# Patient Record
Sex: Female | Born: 1995 | Race: Black or African American | Hispanic: No | Marital: Married | Smoking: Never smoker
Health system: Southern US, Community
[De-identification: ages and names within clinical notes are randomized; demographics above are authoritative.]

## PROBLEM LIST (undated history)

## (undated) ENCOUNTER — Inpatient Hospital Stay: Payer: Self-pay

## (undated) DIAGNOSIS — E119 Type 2 diabetes mellitus without complications: Secondary | ICD-10-CM

## (undated) DIAGNOSIS — D649 Anemia, unspecified: Secondary | ICD-10-CM

## (undated) HISTORY — PX: OTHER SURGICAL HISTORY: SHX169

## (undated) HISTORY — DX: Anemia, unspecified: D64.9

---
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
## 2019-07-13 ENCOUNTER — Other Ambulatory Visit: Payer: Self-pay

## 2019-07-13 DIAGNOSIS — Z20822 Contact with and (suspected) exposure to covid-19: Secondary | ICD-10-CM

## 2019-07-14 LAB — NOVEL CORONAVIRUS, NAA: SARS-CoV-2, NAA: NOT DETECTED

## 2019-08-06 ENCOUNTER — Other Ambulatory Visit: Payer: Self-pay

## 2019-08-06 DIAGNOSIS — Z20822 Contact with and (suspected) exposure to covid-19: Secondary | ICD-10-CM

## 2019-08-08 LAB — NOVEL CORONAVIRUS, NAA: SARS-CoV-2, NAA: NOT DETECTED

## 2019-11-28 ENCOUNTER — Ambulatory Visit: Payer: HRSA Program | Attending: Internal Medicine

## 2019-11-28 DIAGNOSIS — Z20828 Contact with and (suspected) exposure to other viral communicable diseases: Secondary | ICD-10-CM | POA: Insufficient documentation

## 2019-11-28 DIAGNOSIS — Z20822 Contact with and (suspected) exposure to covid-19: Secondary | ICD-10-CM

## 2019-11-29 LAB — NOVEL CORONAVIRUS, NAA: SARS-CoV-2, NAA: NOT DETECTED

## 2019-12-03 ENCOUNTER — Ambulatory Visit: Payer: HRSA Program | Attending: Internal Medicine

## 2019-12-03 DIAGNOSIS — Z20822 Contact with and (suspected) exposure to covid-19: Secondary | ICD-10-CM

## 2019-12-03 DIAGNOSIS — Z20828 Contact with and (suspected) exposure to other viral communicable diseases: Secondary | ICD-10-CM | POA: Insufficient documentation

## 2019-12-05 LAB — NOVEL CORONAVIRUS, NAA: SARS-CoV-2, NAA: NOT DETECTED

## 2019-12-24 ENCOUNTER — Other Ambulatory Visit: Payer: Self-pay

## 2019-12-25 ENCOUNTER — Ambulatory Visit: Payer: Managed Care, Other (non HMO) | Attending: Internal Medicine

## 2019-12-25 DIAGNOSIS — Z20822 Contact with and (suspected) exposure to covid-19: Secondary | ICD-10-CM | POA: Insufficient documentation

## 2019-12-27 LAB — NOVEL CORONAVIRUS, NAA: SARS-CoV-2, NAA: NOT DETECTED

## 2020-02-11 ENCOUNTER — Ambulatory Visit: Payer: Managed Care, Other (non HMO) | Attending: Internal Medicine

## 2020-02-14 ENCOUNTER — Ambulatory Visit: Payer: Managed Care, Other (non HMO) | Attending: Internal Medicine

## 2020-02-14 DIAGNOSIS — Z20822 Contact with and (suspected) exposure to covid-19: Secondary | ICD-10-CM

## 2020-02-15 LAB — NOVEL CORONAVIRUS, NAA: SARS-CoV-2, NAA: NOT DETECTED

## 2020-03-30 ENCOUNTER — Other Ambulatory Visit: Payer: Self-pay

## 2020-03-30 ENCOUNTER — Encounter: Payer: Self-pay | Admitting: Emergency Medicine

## 2020-03-30 ENCOUNTER — Observation Stay
Admission: EM | Admit: 2020-03-30 | Discharge: 2020-03-31 | Disposition: A | Discharge status: Home / Self Care | Payer: Self-pay | Attending: Internal Medicine | Admitting: Internal Medicine

## 2020-03-30 ENCOUNTER — Emergency Department: Payer: Self-pay

## 2020-03-30 DIAGNOSIS — O0383 Metabolic disorder following complete or unspecified spontaneous abortion: Secondary | ICD-10-CM | POA: Insufficient documentation

## 2020-03-30 DIAGNOSIS — N939 Abnormal uterine and vaginal bleeding, unspecified: Secondary | ICD-10-CM

## 2020-03-30 DIAGNOSIS — Z20822 Contact with and (suspected) exposure to covid-19: Secondary | ICD-10-CM | POA: Insufficient documentation

## 2020-03-30 DIAGNOSIS — O24111 Pre-existing diabetes mellitus, type 2, in pregnancy, first trimester: Secondary | ICD-10-CM | POA: Insufficient documentation

## 2020-03-30 DIAGNOSIS — E119 Type 2 diabetes mellitus without complications: Secondary | ICD-10-CM | POA: Insufficient documentation

## 2020-03-30 DIAGNOSIS — D649 Anemia, unspecified: Secondary | ICD-10-CM | POA: Diagnosis present

## 2020-03-30 DIAGNOSIS — E876 Hypokalemia: Secondary | ICD-10-CM | POA: Insufficient documentation

## 2020-03-30 DIAGNOSIS — O99011 Anemia complicating pregnancy, first trimester: Secondary | ICD-10-CM | POA: Insufficient documentation

## 2020-03-30 DIAGNOSIS — D62 Acute posthemorrhagic anemia: Secondary | ICD-10-CM | POA: Insufficient documentation

## 2020-03-30 DIAGNOSIS — Z3A00 Weeks of gestation of pregnancy not specified: Secondary | ICD-10-CM | POA: Insufficient documentation

## 2020-03-30 DIAGNOSIS — O039 Complete or unspecified spontaneous abortion without complication: Secondary | ICD-10-CM

## 2020-03-30 DIAGNOSIS — O036 Delayed or excessive hemorrhage following complete or unspecified spontaneous abortion: Principal | ICD-10-CM | POA: Insufficient documentation

## 2020-03-30 DIAGNOSIS — O99211 Obesity complicating pregnancy, first trimester: Secondary | ICD-10-CM | POA: Insufficient documentation

## 2020-03-30 HISTORY — DX: Type 2 diabetes mellitus without complications: E11.9

## 2020-03-30 LAB — COMPREHENSIVE METABOLIC PANEL
ALT: 13 U/L (ref 0–44)
AST: 14 U/L — ABNORMAL LOW (ref 15–41)
Albumin: 4 g/dL (ref 3.5–5.0)
Alkaline Phosphatase: 53 U/L (ref 38–126)
Anion gap: 7 (ref 5–15)
BUN: 14 mg/dL (ref 6–20)
CO2: 25 mmol/L (ref 22–32)
Calcium: 8.7 mg/dL — ABNORMAL LOW (ref 8.9–10.3)
Chloride: 105 mmol/L (ref 98–111)
Creatinine, Ser: 0.67 mg/dL (ref 0.44–1.00)
GFR calc Af Amer: >60 mL/min (ref >60)
GFR calc non Af Amer: >60 mL/min (ref >60)
Glucose, Bld: 99 mg/dL (ref 70–99)
Potassium: 3.4 mmol/L — ABNORMAL LOW (ref 3.5–5.1)
Sodium: 137 mmol/L (ref 135–145)
Total Bilirubin: 0.5 mg/dL (ref 0.3–1.2)
Total Protein: 7.8 g/dL (ref 6.5–8.1)

## 2020-03-30 LAB — URINALYSIS, COMPLETE (UACMP) WITH MICROSCOPIC
RBC / HPF: >50 RBC/hpf (ref 0–5)
Specific Gravity, Urine: 1.031 — ABNORMAL HIGH (ref 1.005–1.030)
Squamous Epithelial / HPF: NONE SEEN (ref 0–5)
WBC, UA: >50 WBC/hpf (ref 0–5)

## 2020-03-30 LAB — CBC
HCT: 30.6 % — ABNORMAL LOW (ref 36.0–46.0)
Hemoglobin: 8.8 g/dL — ABNORMAL LOW (ref 12.0–15.0)
MCH: 20.8 pg — ABNORMAL LOW (ref 26.0–34.0)
MCHC: 28.8 g/dL — ABNORMAL LOW (ref 30.0–36.0)
MCV: 72.2 fL — ABNORMAL LOW (ref 80.0–100.0)
Platelets: 504 10*3/uL — ABNORMAL HIGH (ref 150–400)
RBC: 4.24 MIL/uL (ref 3.87–5.11)
RDW: 18 % — ABNORMAL HIGH (ref 11.5–15.5)
WBC: 9.8 10*3/uL (ref 4.0–10.5)
nRBC: 0 % (ref 0.0–0.2)

## 2020-03-30 LAB — POCT PREGNANCY, URINE: Preg Test, Ur: POSITIVE — AB

## 2020-03-30 LAB — ABO/RH: ABO/RH(D): A POS

## 2020-03-30 NOTE — ED Notes (Signed)
Pt taken to ultrasound

## 2020-03-30 NOTE — ED Triage Notes (Signed)
Patient states that she took a home pregnancy test Thursday and it was positive. Patient states that she also started having light vaginal bleeding on Monday. Patient states that she started having heavier vaginal bleeding with clots today and some mild abdominal cramping.

## 2020-03-31 ENCOUNTER — Other Ambulatory Visit: Payer: Self-pay

## 2020-03-31 ENCOUNTER — Encounter: Payer: Self-pay | Admitting: Internal Medicine

## 2020-03-31 DIAGNOSIS — N939 Abnormal uterine and vaginal bleeding, unspecified: Secondary | ICD-10-CM

## 2020-03-31 DIAGNOSIS — D649 Anemia, unspecified: Secondary | ICD-10-CM

## 2020-03-31 DIAGNOSIS — D62 Acute posthemorrhagic anemia: Secondary | ICD-10-CM

## 2020-03-31 DIAGNOSIS — O039 Complete or unspecified spontaneous abortion without complication: Secondary | ICD-10-CM

## 2020-03-31 LAB — HCG, QUANTITATIVE, PREGNANCY
hCG, Beta Chain, Quant, S: 27 m[IU]/mL — ABNORMAL HIGH (ref <5)
hCG, Beta Chain, Quant, S: 19 m[IU]/mL — ABNORMAL HIGH (ref <5)

## 2020-03-31 LAB — CBC
HCT: 28.8 % — ABNORMAL LOW (ref 36.0–46.0)
Hemoglobin: 8.3 g/dL — ABNORMAL LOW (ref 12.0–15.0)
MCH: 20.6 pg — ABNORMAL LOW (ref 26.0–34.0)
MCHC: 28.8 g/dL — ABNORMAL LOW (ref 30.0–36.0)
MCV: 71.6 fL — ABNORMAL LOW (ref 80.0–100.0)
Platelets: 497 10*3/uL — ABNORMAL HIGH (ref 150–400)
RBC: 4.02 MIL/uL (ref 3.87–5.11)
RDW: 17.9 % — ABNORMAL HIGH (ref 11.5–15.5)
WBC: 11.4 10*3/uL — ABNORMAL HIGH (ref 4.0–10.5)
nRBC: 0 % (ref 0.0–0.2)

## 2020-03-31 LAB — COMPREHENSIVE METABOLIC PANEL
ALT: 12 U/L (ref 0–44)
AST: 15 U/L (ref 15–41)
Albumin: 3.5 g/dL (ref 3.5–5.0)
Alkaline Phosphatase: 46 U/L (ref 38–126)
Anion gap: 6 (ref 5–15)
BUN: 13 mg/dL (ref 6–20)
CO2: 27 mmol/L (ref 22–32)
Calcium: 8.6 mg/dL — ABNORMAL LOW (ref 8.9–10.3)
Chloride: 105 mmol/L (ref 98–111)
Creatinine, Ser: 0.72 mg/dL (ref 0.44–1.00)
GFR calc Af Amer: >60 mL/min (ref >60)
GFR calc non Af Amer: >60 mL/min (ref >60)
Glucose, Bld: 108 mg/dL — ABNORMAL HIGH (ref 70–99)
Potassium: 3.1 mmol/L — ABNORMAL LOW (ref 3.5–5.1)
Sodium: 138 mmol/L (ref 135–145)
Total Bilirubin: 0.6 mg/dL (ref 0.3–1.2)
Total Protein: 7.2 g/dL (ref 6.5–8.1)

## 2020-03-31 LAB — TYPE AND SCREEN
ABO/RH(D): A POS
Antibody Screen: NEGATIVE

## 2020-03-31 LAB — HEMOGLOBIN AND HEMATOCRIT, BLOOD
HCT: 28.6 % — ABNORMAL LOW (ref 36.0–46.0)
HCT: 30 % — ABNORMAL LOW (ref 36.0–46.0)
Hemoglobin: 8.2 g/dL — ABNORMAL LOW (ref 12.0–15.0)
Hemoglobin: 8.7 g/dL — ABNORMAL LOW (ref 12.0–15.0)

## 2020-03-31 LAB — RETICULOCYTES
Immature Retic Fract: 40.4 % — ABNORMAL HIGH (ref 2.3–15.9)
RBC.: 4.08 MIL/uL (ref 3.87–5.11)
Retic Count, Absolute: 37.9 10*3/uL (ref 19.0–186.0)
Retic Ct Pct: 0.9 % (ref 0.4–3.1)

## 2020-03-31 LAB — VITAMIN B12: Vitamin B-12: 460 pg/mL (ref 180–914)

## 2020-03-31 LAB — IRON AND TIBC
Iron: 19 ug/dL — ABNORMAL LOW (ref 28–170)
Saturation Ratios: 5 % — ABNORMAL LOW (ref 10.4–31.8)
TIBC: 368 ug/dL (ref 250–450)
UIBC: 349 ug/dL

## 2020-03-31 LAB — FOLATE: Folate: 14.3 ng/mL (ref >5.9)

## 2020-03-31 LAB — FERRITIN: Ferritin: 6 ng/mL — ABNORMAL LOW (ref 11–307)

## 2020-03-31 LAB — HIV ANTIBODY (ROUTINE TESTING W REFLEX): HIV Screen 4th Generation wRfx: NONREACTIVE

## 2020-03-31 LAB — GLUCOSE, CAPILLARY
Glucose-Capillary: 82 mg/dL (ref 70–99)
Glucose-Capillary: 110 mg/dL — ABNORMAL HIGH (ref 70–99)

## 2020-03-31 LAB — HEMOGLOBIN A1C
Hgb A1c MFr Bld: 6.6 % — ABNORMAL HIGH (ref 4.8–5.6)
Mean Plasma Glucose: 142.72 mg/dL

## 2020-03-31 LAB — RESPIRATORY PANEL BY RT PCR (FLU A&B, COVID)
Influenza A by PCR: NEGATIVE
Influenza B by PCR: NEGATIVE
SARS Coronavirus 2 by RT PCR: NEGATIVE

## 2020-03-31 MED ORDER — SODIUM CHLORIDE 0.9 % IV SOLN
INTRAVENOUS | Status: DC | PRN
  Administered 2020-03-31: 250 mL via INTRAVENOUS

## 2020-03-31 MED ORDER — POTASSIUM CHLORIDE CRYS ER 20 MEQ PO TBCR
40.0000 meq | EXTENDED_RELEASE_TABLET | Freq: Once | ORAL | Status: AC
  Administered 2020-03-31: 40 meq via ORAL
  Filled 2020-03-31: qty 2

## 2020-03-31 MED ORDER — POTASSIUM CHLORIDE 20 MEQ/15ML (10%) PO SOLN
40.0000 meq | Freq: Once | ORAL | Status: DC
  Filled 2020-03-31: qty 30

## 2020-03-31 MED ORDER — SODIUM CHLORIDE 0.9 % IV SOLN
200.0000 mg | Freq: Once | INTRAVENOUS | Status: AC
  Administered 2020-03-31: 200 mg via INTRAVENOUS
  Filled 2020-03-31: qty 10

## 2020-03-31 MED ORDER — POTASSIUM CHLORIDE 20 MEQ/15ML (10%) PO SOLN
40.0000 meq | Freq: Once | ORAL | Status: AC
  Administered 2020-03-31: 40 meq via ORAL
  Filled 2020-03-31: qty 30

## 2020-03-31 MED ORDER — INSULIN ASPART 100 UNIT/ML ~~LOC~~ SOLN: 0.0000 [IU] | Freq: Three times a day (TID) | SUBCUTANEOUS | Status: DC

## 2020-03-31 NOTE — ED Notes (Signed)
Pt back from ultrasound.

## 2020-03-31 NOTE — Discharge Summary (Addendum)
Physician Discharge Summary  Robin Houston HYQ:[id number] DOB: [birth year] DOA: 03/30/2020  PCP: Patient, No Pcp Per  Admit date: 03/30/2020 Discharge date: 03/31/2020  Admitted From: home Disposition:  home  Recommendations for Outpatient Follow-up:  Follow up with PCP in 1-2 weeks Please obtain BMP/CBC in one week   Home Health: no  Equipment/Devices: none   Discharge Condition: stable  CODE STATUS: full  Diet recommendation: Heart Healthy / Carb Modified    Discharge Diagnoses: Active Problems:   Symptomatic anemia Miscarriage with blood loss anemia  Summary of HPI and Hospital Course:   Robin Houston is a 24 y.o. female with medical history significant of obesity, diet-controlled diabetes mellitus, who comes to the hospital with chief complaint of vaginal bleeding.  Her LMP was about 5 weeks ago, and because it was delayed she checked a pregnancy test over a week ago when there was positive.  Last week she has some vaginal spotting, however over the last couple of days she has noticed progressive vaginal bleeding, more so today and decided to come to the hospital and also has been passing some large clots.  She denies any nausea or vomiting, no fever or chills.  No abdominal pain.  She denies any chest pain or shortness of breath.  No lightheadedness or dizziness.  She never had a pregnancy before.  Currently does not have an OB/GYN doctor.   ED Course: She is afebrile 98.2, heart rate 100, blood pressure normal and 79 9% on room air.  Her blood work shows anemia with hemoglobin 8.8, most recent hemoglobin as an outpatient is in January 2021 which was 13.  She underwent an ultrasound in the ED which did not show any intrauterine pregnancy, suspicious for failed pregnancy or early gestation.  EDP discussed with OB/GYN on-call, recommended admission for monitoring H&H, repeat bHCG.   Bleeding decreased significantly overnight and the following day.  Hemoglobin stabilized and  improved.  Patient was given an iron infusion.  Repeat HCG 19, down from 27.  Patient stable for discharge home with PCP follow up, and was provided information for establishing with a PCP by social worker.  Advised she have repeat labs in 1 week or sooner if any increased bleeding, patient and husband expressed agreement and understanding.   Discharge Instructions   Discharge Instructions     Call MD for:   Complete by: As directed    Recurrence of heavy bleeding, especially if also having fatigue, dizziness or lightheadedness.   Call MD for:  extreme fatigue   Complete by: As directed    Call MD for:  persistant dizziness or light-headedness   Complete by: As directed    Call MD for:  severe uncontrolled pain   Complete by: As directed    Call MD for:  temperature >100.4   Complete by: As directed    Increase activity slowly   Complete by: As directed       Allergies as of 03/31/2020   No Known Allergies      Medication List     TAKE these medications    metFORMIN 1000 MG tablet Commonly known as: GLUCOPHAGE Take 1,000 mg by mouth 2 (two) times daily.         No Known Allergies  Consultations: None    Procedures/Studies: US OB LESS THAN 14 WEEKS WITH OB TRANSVAGINAL  Result Date: 03/31/2020 CLINICAL DATA:  Vaginal bleeding EXAM: OBSTETRIC <14 WK [place] AND TRANSVAGINAL OB US TECHNIQUE: Both transabdominal and transvaginal ultrasound examinations were  performed for complete evaluation of the gestation as well as the maternal uterus, adnexal regions, and pelvic cul-de-sac. Transvaginal technique was performed to assess early pregnancy. COMPARISON:  None. FINDINGS: Intrauterine gestational sac: None Yolk sac:  Not Visualized. Embryo:  Not Visualized. Subchorionic hemorrhage:  None visualized. Maternal uterus/adnexae: The right ovary was not visualized. The left ovary is normal in appearance measuring 2.0 x 2.7 x 2.4 cm. No free fluid is noted. IMPRESSION: No intrauterine  pregnancy visualized. This may be suspicious for failed pregnancy or early gestation. Would recommend follow-up quantitative B-HCG levels and follow-up US in 14 days to assess viability. This recommendation follows SRU consensus guidelines: Diagnostic Criteria for Nonviable Pregnancy Early in the First Trimester. Malva Limes Med 2013; 937:1696-78. Electronically Signed   By: Jonna Clark M.D.   On: 03/31/2020 00:00      Subjective: Patient reports feeling well, bleeding slowed and now minimal.  No abdominal pain or cramping, fever or chills or other complaints.   Discharge Exam: Vitals:   03/31/20 0739 03/31/20 1101  BP: (!) 109/52 99/66  Pulse: 75 90  Resp: 18 20  Temp: 97.6 F (36.4 C) 98.1 F (36.7 C)  SpO2: 100%    Vitals:   03/31/20 0220 03/31/20 0239 03/31/20 0739 03/31/20 1101  BP: 129/69 122/63 (!) 109/52 99/66  Pulse: 85 (!) 102 75 90  Resp: 18 20 18 20   Temp:  98.4 F (36.9 C) 97.6 F (36.4 C) 98.1 F (36.7 C)  TempSrc:  Oral Oral Oral  SpO2: 100% 99% 100%   Weight:      Height:        General: Pt is alert, awake, not in acute distress Cardiovascular: RRR, S1/S2 +, no rubs, no gallops Respiratory: CTA bilaterally, no wheezing, no rhonchi Abdominal: Soft, NT, ND, bowel sounds + Extremities: no edema, no cyanosis    The results of significant diagnostics from this hospitalization (including imaging, microbiology, ancillary and laboratory) are listed below for reference.     Microbiology: Recent Results (from the past 240 hour(s))  Respiratory Panel by RT PCR (Flu A&B, Covid) - Nasopharyngeal Swab     Status: None   Collection Time: 03/31/20 12:48 AM   Specimen: Nasopharyngeal Swab  Result Value Ref Range Status   SARS Coronavirus 2 by RT PCR NEGATIVE NEGATIVE Final    Comment: (NOTE) SARS-CoV-2 target nucleic acids are NOT DETECTED. The SARS-CoV-2 RNA is generally detectable in upper respiratoy specimens during the acute phase of infection. The  lowest concentration of SARS-CoV-2 viral copies this assay can detect is 131 copies/mL. A negative result does not preclude SARS-Cov-2 infection and should not be used as the sole basis for treatment or other patient management decisions. A negative result may occur with  improper specimen collection/handling, submission of specimen other than nasopharyngeal swab, presence of viral mutation(s) within the areas targeted by this assay, and inadequate number of viral copies (<131 copies/mL). A negative result must be combined with clinical observations, patient history, and epidemiological information. The expected result is Negative. Fact Sheet for Patients:  04/02/20 Fact Sheet for Healthcare Providers:  https://www.moore.com/ This test is not yet ap proved or cleared by the https://www.young.biz/ FDA and  has been authorized for detection and/or diagnosis of SARS-CoV-2 by FDA under an Emergency Use Authorization (EUA). This EUA will remain  in effect (meaning this test can be used) for the duration of the COVID-19 declaration under Section 564(b)(1) of the Act, 21 U.S.C. section 360bbb-3(b)(1), unless the authorization  is terminated or revoked sooner.    Influenza A by PCR NEGATIVE NEGATIVE Final   Influenza B by PCR NEGATIVE NEGATIVE Final    Comment: (NOTE) The Xpert Xpress SARS-CoV-2/FLU/RSV assay is intended as an aid in  the diagnosis of influenza from Nasopharyngeal swab specimens and  should not be used as a sole basis for treatment. Nasal washings and  aspirates are unacceptable for Xpert Xpress SARS-CoV-2/FLU/RSV  testing. Fact Sheet for Patients: PinkCheek.be Fact Sheet for Healthcare Providers: GravelBags.it This test is not yet approved or cleared by the [place] FDA and  has been authorized for detection and/or diagnosis of SARS-CoV-2 by  FDA under an Emergency  Use Authorization (EUA). This EUA will remain  in effect (meaning this test can be used) for the duration of the  Covid-19 declaration under Section 564(b)(1) of the Act, 21  U.S.C. section 360bbb-3(b)(1), unless the authorization is  terminated or revoked. Performed at [facility], [address] [place], [place] [address]      Labs: BNP (last 3 results) No results for input(s): BNP in the last 8760 hours. Basic Metabolic Panel: Recent Labs  Lab 03/30/20 2159 03/31/20 0329  NA 137 138  K 3.4* 3.1*  CL 105 105  CO2 25 27  GLUCOSE 99 108*  BUN 14 13  CREATININE 0.67 0.72  CALCIUM 8.7* 8.6*   Liver Function Tests: Recent Labs  Lab 03/30/20 2159 03/31/20 0329  AST 14* 15  ALT 13 12  ALKPHOS 53 46  BILITOT 0.5 0.6  PROT 7.8 7.2  ALBUMIN 4.0 3.5   No results for input(s): LIPASE, AMYLASE in the last 168 hours. No results for input(s): AMMONIA in the last 168 hours. CBC: Recent Labs  Lab 03/30/20 2159 03/31/20 0329 03/31/20 1248  WBC 9.8 11.4*  --   HGB 8.8* 8.2*  8.3* 8.7*  HCT 30.6* 28.6*  28.8* 30.0*  MCV 72.2* 71.6*  --   PLT 504* 497*  --    Cardiac Enzymes: No results for input(s): CKTOTAL, CKMB, CKMBINDEX, TROPONINI in the last 168 hours. BNP: Invalid input(s): POCBNP CBG: Recent Labs  Lab 03/31/20 0828 03/31/20 1231  GLUCAP 82 110*   D-Dimer No results for input(s): DDIMER in the last 72 hours. Hgb A1c Recent Labs    03/31/20 0329  HGBA1C 6.6*   Lipid Profile No results for input(s): CHOL, HDL, LDLCALC, TRIG, CHOLHDL, LDLDIRECT in the last 72 hours. Thyroid function studies No results for input(s): TSH, T4TOTAL, T3FREE, THYROIDAB in the last 72 hours.  Invalid input(s): FREET3 Anemia work up Recent Labs    03/31/20 0329  VITAMINB12 460  FOLATE 14.3  FERRITIN 6*  TIBC 368  IRON 19*  RETICCTPCT 0.9   Urinalysis    Component Value Date/Time   COLORURINE RED (A) 03/30/2020 2159   APPEARANCEUR CLOUDY (A)  03/30/2020 2159   LABSPEC 1.031 (H) 03/30/2020 2159   PHURINE  03/30/2020 2159    TEST NOT REPORTED DUE TO COLOR INTERFERENCE OF URINE PIGMENT   GLUCOSEU (A) 03/30/2020 2159    TEST NOT REPORTED DUE TO COLOR INTERFERENCE OF URINE PIGMENT   HGBUR (A) 03/30/2020 2159    TEST NOT REPORTED DUE TO COLOR INTERFERENCE OF URINE PIGMENT   BILIRUBINUR (A) 03/30/2020 2159    TEST NOT REPORTED DUE TO COLOR INTERFERENCE OF URINE PIGMENT   KETONESUR (A) 03/30/2020 2159    TEST NOT REPORTED DUE TO COLOR INTERFERENCE OF URINE PIGMENT   PROTEINUR (A) 03/30/2020 2159  TEST NOT REPORTED DUE TO COLOR INTERFERENCE OF URINE PIGMENT   NITRITE (A) 03/30/2020 2159    TEST NOT REPORTED DUE TO COLOR INTERFERENCE OF URINE PIGMENT   LEUKOCYTESUR (A) 03/30/2020 2159    TEST NOT REPORTED DUE TO COLOR INTERFERENCE OF URINE PIGMENT   Sepsis Labs Invalid input(s): PROCALCITONIN,  WBC,  LACTICIDVEN Microbiology Recent Results (from the past 240 hour(s))  Respiratory Panel by RT PCR (Flu A&B, Covid) - Nasopharyngeal Swab     Status: None   Collection Time: 03/31/20 12:48 AM   Specimen: Nasopharyngeal Swab  Result Value Ref Range Status   SARS Coronavirus 2 by RT PCR NEGATIVE NEGATIVE Final    Comment: (NOTE) SARS-CoV-2 target nucleic acids are NOT DETECTED. The SARS-CoV-2 RNA is generally detectable in upper respiratoy specimens during the acute phase of infection. The lowest concentration of SARS-CoV-2 viral copies this assay can detect is 131 copies/mL. A negative result does not preclude SARS-Cov-2 infection and should not be used as the sole basis for treatment or other patient management decisions. A negative result may occur with  improper specimen collection/handling, submission of specimen other than nasopharyngeal swab, presence of viral mutation(s) within the areas targeted by this assay, and inadequate number of viral copies (<131 copies/mL). A negative result must be combined with  clinical observations, patient history, and epidemiological information. The expected result is Negative. Fact Sheet for Patients:  https://www.moore.com/ Fact Sheet for Healthcare Providers:  https://www.young.biz/ This test is not yet ap proved or cleared by the [place] FDA and  has been authorized for detection and/or diagnosis of SARS-CoV-2 by FDA under an Emergency Use Authorization (EUA). This EUA will remain  in effect (meaning this test can be used) for the duration of the COVID-19 declaration under Section 564(b)(1) of the Act, 21 U.S.C. section 360bbb-3(b)(1), unless the authorization is terminated or revoked sooner.    Influenza A by PCR NEGATIVE NEGATIVE Final   Influenza B by PCR NEGATIVE NEGATIVE Final    Comment: (NOTE) The Xpert Xpress SARS-CoV-2/FLU/RSV assay is intended as an aid in  the diagnosis of influenza from Nasopharyngeal swab specimens and  should not be used as a sole basis for treatment. Nasal washings and  aspirates are unacceptable for Xpert Xpress SARS-CoV-2/FLU/RSV  testing. Fact Sheet for Patients: https://www.moore.com/ Fact Sheet for Healthcare Providers: https://www.young.biz/ This test is not yet approved or cleared by the [place] FDA and  has been authorized for detection and/or diagnosis of SARS-CoV-2 by  FDA under an Emergency Use Authorization (EUA). This EUA will remain  in effect (meaning this test can be used) for the duration of the  Covid-19 declaration under Section 564(b)(1) of the Act, 21  U.S.C. section 360bbb-3(b)(1), unless the authorization is  terminated or revoked. Performed at [facility], [address] [place], [place] [address]      Time coordinating discharge: Over 30 minutes  SIGNED:   Pennie Banter, DO [facility] 03/31/2020, 1:59 PM   If 7PM-7AM, please contact night-coverage [email or web address]

## 2020-03-31 NOTE — ED Provider Notes (Signed)
[facility] Emergency Department Provider Note  ____________________________________________  Time seen: Approximately 12:55 AM  I have reviewed the triage vital signs and the nursing notes.   HISTORY  Chief Complaint Vaginal Bleeding   HPI Robin Houston is a 24 y.o. female G1, P0 at [redacted] weeks gestational age with a history of diabetes who presents for evaluation of vaginal bleeding.  Patient reports taking 4 home pregnancy test a week ago which were all positive.  LMP was 5 weeks ago.  Last week she started with some mild vaginal spotting and then today she started having vaginal bleeding.  She reports going through 3 pads and passing some large clots.  She denies any history of anemia, bleeding disorder, she is not on any blood thinners, denies any dizziness, syncope, chest pain or shortness of breath.  She has been having some intermittent mild lower abdominal cramping.   Past Medical History:  Diagnosis Date  . Diabetes mellitus without complication (HCC)    type 2    Allergies Patient has no known allergies.  FH Sister - anemia  Social History Social History   Tobacco Use  . Smoking status: Never Smoker  . Smokeless tobacco: Never Used  Substance Use Topics  . Alcohol use: Never  . Drug use: Never    Review of Systems  Constitutional: Negative for fever. Eyes: Negative for visual changes. ENT: Negative for sore throat. Neck: No neck pain  Cardiovascular: Negative for chest pain. Respiratory: Negative for shortness of breath. Gastrointestinal: Negative for abdominal pain, vomiting or diarrhea. Genitourinary: Negative for dysuria. + Vaginal bleed Musculoskeletal: Negative for back pain. Skin: Negative for rash. Neurological: Negative for headaches, weakness or numbness. Psych: No SI or HI  ____________________________________________   PHYSICAL EXAM:  VITAL SIGNS: ED Triage Vitals [03/30/20 2155]  Enc Vitals Group     BP  134/75     Pulse Rate 100     Resp 18     Temp 98.2 F (36.8 C)     Temp Source Oral     SpO2 99 %     Weight 283 lb (128.4 kg)     Height 5\' 5"  (1.651 m)     Head Circumference      Peak Flow      Pain Score 2     Pain Loc      Pain Edu?      Excl. in GC?     Constitutional: Alert and oriented. Well appearing and in no apparent distress. HEENT:      Head: Normocephalic and atraumatic.         Eyes: Conjunctivae are normal. Sclera is non-icteric.       Mouth/Throat: Mucous membranes are moist.       Neck: Supple with no signs of meningismus. Cardiovascular: Regular rate and rhythm. No murmurs, gallops, or rubs.  Respiratory: Normal respiratory effort. Lungs are clear to auscultation bilaterally.  Gastrointestinal: Soft, non tender, and non distended with positive bowel sounds. No rebound or guarding. Musculoskeletal:  No edema, cyanosis, or erythema of extremities. Neurologic: Normal speech and language. Face is symmetric. Moving all extremities. No gross focal neurologic deficits are appreciated. Skin: Skin is warm, dry and intact. No rash noted. Psychiatric: Mood and affect are normal. Speech and behavior are normal.  ____________________________________________   LABS (all labs ordered are listed, but only abnormal results are displayed)  Labs Reviewed  CBC - Abnormal; Notable for the following components:      Result  Value   Hemoglobin 8.8 (*)    HCT 30.6 (*)    MCV 72.2 (*)    MCH 20.8 (*)    MCHC 28.8 (*)    RDW 18.0 (*)    Platelets 504 (*)    All other components within normal limits  COMPREHENSIVE METABOLIC PANEL - Abnormal; Notable for the following components:   Potassium 3.4 (*)    Calcium 8.7 (*)    AST 14 (*)    All other components within normal limits  URINALYSIS, COMPLETE (UACMP) WITH MICROSCOPIC - Abnormal; Notable for the following components:   Color, Urine RED (*)    APPearance CLOUDY (*)    Specific Gravity, Urine 1.031 (*)    Glucose, UA    (*)    Value: TEST NOT REPORTED DUE TO COLOR INTERFERENCE OF URINE PIGMENT   Hgb urine dipstick   (*)    Value: TEST NOT REPORTED DUE TO COLOR INTERFERENCE OF URINE PIGMENT   Bilirubin Urine   (*)    Value: TEST NOT REPORTED DUE TO COLOR INTERFERENCE OF URINE PIGMENT   Ketones, ur   (*)    Value: TEST NOT REPORTED DUE TO COLOR INTERFERENCE OF URINE PIGMENT   Protein, ur   (*)    Value: TEST NOT REPORTED DUE TO COLOR INTERFERENCE OF URINE PIGMENT   Nitrite   (*)    Value: TEST NOT REPORTED DUE TO COLOR INTERFERENCE OF URINE PIGMENT   Leukocytes,Ua   (*)    Value: TEST NOT REPORTED DUE TO COLOR INTERFERENCE OF URINE PIGMENT   Bacteria, UA RARE (*)    All other components within normal limits  HCG, QUANTITATIVE, PREGNANCY - Abnormal; Notable for the following components:   hCG, Beta Chain, Quant, S 27 (*)    All other components within normal limits  POCT PREGNANCY, URINE - Abnormal; Notable for the following components:   Preg Test, Ur POSITIVE (*)    All other components within normal limits  RESPIRATORY PANEL BY RT PCR (FLU A&B, COVID)  HEMOGLOBIN AND HEMATOCRIT, BLOOD  POC URINE PREG, ED  ABO/RH  TYPE AND SCREEN   ____________________________________________  EKG  none  ____________________________________________  RADIOLOGY  I have personally reviewed the images performed during this visit and I agree with the Radiologist's read.   Interpretation by Radiologist:  US OB LESS THAN 14 WEEKS WITH OB TRANSVAGINAL  Result Date: 03/31/2020 CLINICAL DATA:  Vaginal bleeding EXAM: OBSTETRIC <14 WK [place] AND TRANSVAGINAL OB US TECHNIQUE: Both transabdominal and transvaginal ultrasound examinations were performed for complete evaluation of the gestation as well as the maternal uterus, adnexal regions, and pelvic cul-de-sac. Transvaginal technique was performed to assess early pregnancy. COMPARISON:  None. FINDINGS: Intrauterine gestational sac: None Yolk sac:  Not Visualized. Embryo:   Not Visualized. Subchorionic hemorrhage:  None visualized. Maternal uterus/adnexae: The right ovary was not visualized. The left ovary is normal in appearance measuring 2.0 x 2.7 x 2.4 cm. No free fluid is noted. IMPRESSION: No intrauterine pregnancy visualized. This may be suspicious for failed pregnancy or early gestation. Would recommend follow-up quantitative B-HCG levels and follow-up US in 14 days to assess viability. This recommendation follows SRU consensus guidelines: Diagnostic Criteria for Nonviable Pregnancy Early in the First Trimester. Alta Corning Med 2013; 322:0254-27. Electronically Signed   By: Prudencio Pair M.D.   On: 03/31/2020 00:00     ____________________________________________   PROCEDURES  Procedure(s) performed: None Procedures Critical Care performed:  None ____________________________________________   INITIAL IMPRESSION /  ASSESSMENT AND PLAN / ED COURSE  24 y.o. female G1, P0 at [redacted] weeks gestational age with a history of diabetes who presents for evaluation of vaginal bleeding.  Patient is hemodynamically stable, abdomen is soft with no tenderness.  Ultrasound shows no evidence of pregnancy.  Blood work showing hemoglobin of 8.8 patient had lab work done back in January 2021 with a hemoglobin of 13.  She denies any prior history of anemia.  Also has thrombocytosis with platelets of 504.  MCV of 72.2.  These lab results are most likely due to a failed pregnancy and current miscarriage.  Type and cross is active.  Discussed with hospitalist for admission for monitoring vaginal bleeding and need for blood transfusion.  Consult OB/GYN, Genia Del CNM on call who agrees with plan. Patient will also need repeat hCG to ensure this is a miscarriage and not an early pregnancy which has not been visualized at this time.  Old medical records have been reviewed.  History gathered from patient and her husband who is at bedside.       _____________________________________________ Please note:  Patient was evaluated in Emergency Department today for the symptoms described in the history of present illness. Patient was evaluated in the context of the global COVID-19 pandemic, which necessitated consideration that the patient might be at risk for infection with the SARS-CoV-2 virus that causes COVID-19. Institutional protocols and algorithms that pertain to the evaluation of patients at risk for COVID-19 are in a state of rapid change based on information released by regulatory bodies including the CDC and federal and state organizations. These policies and algorithms were followed during the patient's care in the ED.  Some ED evaluations and interventions may be delayed as a result of limited staffing during the pandemic.   [place] Controlled Substance Database was reviewed by me. ____________________________________________   FINAL CLINICAL IMPRESSION(S) / ED DIAGNOSES   Final diagnoses:  Vaginal bleeding  Acute blood loss anemia  Miscarriage      NEW MEDICATIONS STARTED DURING THIS VISIT:  ED Discharge Orders    None       Note:  This document was prepared using Dragon voice recognition software and may include unintentional dictation errors.    Don Perking, [place], MD 03/31/20 0100

## 2020-03-31 NOTE — Progress Notes (Signed)
CSW was consulted due to patient needing a PCP. CSW met with patient at bedside. Patient is uninsured. CSW provided [employer] Booklet as well as [facility] and [facility] application. Patient was agreeable to CSW sending patient's information to [facility]. CSW informed [facility] Representative [place] of referral.  Patient denied additional needs. CSW encouraged patient to reach out if any needs arise.  Oleh Genin, [place]

## 2020-03-31 NOTE — Progress Notes (Addendum)
Robin Houston was seen and treated at [facility] on 03/30/2020-03/31/2020. Vito Berger was her support person.    Treatment Team:  Attending Provider: Pennie Banter, DO

## 2020-03-31 NOTE — Discharge Instructions (Signed)
Abnormal Uterine Bleeding °Abnormal uterine bleeding means bleeding more than usual from your uterus. It can include: °· Bleeding between periods. °· Bleeding after sex. °· Bleeding that is heavier than normal. °· Periods that last longer than usual. °· Bleeding after you have stopped having your period (menopause). °There are many problems that may cause this. You should see a doctor for any kind of bleeding that is not normal. Treatment depends on the cause of the bleeding. °Follow these instructions at home: °· Watch your condition for any changes. °· Do not use tampons, douche, or have sex, if your doctor tells you not to. °· Change your pads often. °· Get regular well-woman exams. Make sure they include a pelvic exam and cervical cancer screening. °· Keep all follow-up visits as told by your doctor. This is important. °Contact a doctor if: °· The bleeding lasts more than one week. °· You feel dizzy at times. °· You feel like you are going to throw up (nauseous). °· You throw up. °Get help right away if: °· You pass out. °· You have to change pads every hour. °· You have belly (abdominal) pain. °· You have a fever. °· You get sweaty. °· You get weak. °· You passing large blood clots from your vagina. °Summary °· Abnormal uterine bleeding means bleeding more than usual from your uterus. °· There are many problems that may cause this. You should see a doctor for any kind of bleeding that is not normal. °· Treatment depends on the cause of the bleeding. °This information is not intended to replace advice given to you by your health care provider. Make sure you discuss any questions you have with your health care provider. °Document Revised: 11/16/2016 Document Reviewed: 11/16/2016 °Elsevier Patient Education © 2020 Elsevier Inc. ° °

## 2020-03-31 NOTE — ED Notes (Signed)
Provider at bedside

## 2020-03-31 NOTE — H&P (Addendum)
History and Physical    Robin Houston SHN:[id number] DOB: 15-Nov-1996 DOA: 03/30/2020  I have briefly reviewed the patient's prior medical records in [facility]  PCP: Patient, No Pcp Per  Patient coming from: home  Chief Complaint: vaginal bleeding   HPI: Robin Houston is a 24 y.o. female with medical history significant of obesity, diet-controlled diabetes mellitus, who comes to the hospital with chief complaint of vaginal bleeding.  Her LMP was about 5 weeks ago, and because it was delayed she checked a pregnancy test over a week ago when there was positive.  Last week she has some vaginal spotting, however over the last couple of days she has noticed progressive vaginal bleeding, more so today and decided to come to the hospital and also has been passing some large clots.  She denies any nausea or vomiting, no fever or chills.  No abdominal pain.  She denies any chest pain or shortness of breath.  No lightheadedness or dizziness.  She never had a pregnancy before.  Currently does not have an OB/GYN doctor.  ED Course: She is afebrile 98.2, heart rate 100, blood pressure normal and 79 9% on room air.  Her blood work shows anemia with hemoglobin 8.8, most recent hemoglobin as an outpatient is in January 2021 which was 13.  She underwent an ultrasound in the ED which did not show any intrauterine pregnancy, suspicious for failed pregnancy or early gestation.  EDP discussed with OB/GYN on-call, recommended admission for monitoring H&H, repeat  Review of Systems: All systems reviewed, and apart from HPI, all negative  Past Medical History:  Diagnosis Date  . Diabetes mellitus without complication (HCC)    type 2    History reviewed. No pertinent surgical history.   reports that she has never smoked. She has never used smokeless tobacco. She reports that she does not drink alcohol or use drugs.  No Known Allergies  Family history reviewed and not pertinent  Prior to  Admission medications   Not on File    Physical Exam: Vitals:   03/30/20 2155  BP: 134/75  Pulse: 100  Resp: 18  Temp: 98.2 F (36.8 C)  TempSrc: Oral  SpO2: 99%  Weight: 128.4 kg  Height: 5\' 5"  (1.651 m)    Constitutional: NAD, calm, comfortable Eyes: PERRL, lids and conjunctivae normal ENMT: Mucous membranes are moist.  Neck: normal, supple Respiratory: clear to auscultation bilaterally, no wheezing, no crackles. Normal respiratory effort.  Cardiovascular: Regular rate and rhythm, no murmurs / rubs / gallops. No extremity edema. 2+ pedal pulses.  Abdomen: no tenderness, no masses palpated. Bowel sounds positive.  Musculoskeletal: no clubbing / cyanosis. Normal muscle tone.  Skin: no rashes, lesions, ulcers. No induration Neurologic: CN 2-12 grossly intact. Strength 5/5 in all 4.  Psychiatric: Normal judgment and insight. Alert and oriented x 3. Normal mood.   Labs on Admission: I have personally reviewed following labs and imaging studies  CBC: Recent Labs  Lab 03/30/20 2159  WBC 9.8  HGB 8.8*  HCT 30.6*  MCV 72.2*  PLT 504*   Basic Metabolic Panel: Recent Labs  Lab 03/30/20 2159  NA 137  K 3.4*  CL 105  CO2 25  GLUCOSE 99  BUN 14  CREATININE 0.67  CALCIUM 8.7*   Liver Function Tests: Recent Labs  Lab 03/30/20 2159  AST 14*  ALT 13  ALKPHOS 53  BILITOT 0.5  PROT 7.8  ALBUMIN 4.0   Coagulation Profile: No results for input(s): INR, PROTIME  in the last 168 hours. BNP (last 3 results) No results for input(s): PROBNP in the last 8760 hours. CBG: No results for input(s): GLUCAP in the last 168 hours. Thyroid Function Tests: No results for input(s): TSH, T4TOTAL, FREET4, T3FREE, THYROIDAB in the last 72 hours. Urine analysis:    Component Value Date/Time   COLORURINE RED (A) 03/30/2020 2159   APPEARANCEUR CLOUDY (A) 03/30/2020 2159   LABSPEC 1.031 (H) 03/30/2020 2159   PHURINE  03/30/2020 2159    TEST NOT REPORTED DUE TO COLOR INTERFERENCE  OF URINE PIGMENT   GLUCOSEU (A) 03/30/2020 2159    TEST NOT REPORTED DUE TO COLOR INTERFERENCE OF URINE PIGMENT   HGBUR (A) 03/30/2020 2159    TEST NOT REPORTED DUE TO COLOR INTERFERENCE OF URINE PIGMENT   BILIRUBINUR (A) 03/30/2020 2159    TEST NOT REPORTED DUE TO COLOR INTERFERENCE OF URINE PIGMENT   KETONESUR (A) 03/30/2020 2159    TEST NOT REPORTED DUE TO COLOR INTERFERENCE OF URINE PIGMENT   PROTEINUR (A) 03/30/2020 2159    TEST NOT REPORTED DUE TO COLOR INTERFERENCE OF URINE PIGMENT   NITRITE (A) 03/30/2020 2159    TEST NOT REPORTED DUE TO COLOR INTERFERENCE OF URINE PIGMENT   LEUKOCYTESUR (A) 03/30/2020 2159    TEST NOT REPORTED DUE TO COLOR INTERFERENCE OF URINE PIGMENT     Radiological Exams on Admission: US OB LESS THAN 14 WEEKS WITH OB TRANSVAGINAL  Result Date: 03/31/2020 CLINICAL DATA:  Vaginal bleeding EXAM: OBSTETRIC <14 WK [place] AND TRANSVAGINAL OB US TECHNIQUE: Both transabdominal and transvaginal ultrasound examinations were performed for complete evaluation of the gestation as well as the maternal uterus, adnexal regions, and pelvic cul-de-sac. Transvaginal technique was performed to assess early pregnancy. COMPARISON:  None. FINDINGS: Intrauterine gestational sac: None Yolk sac:  Not Visualized. Embryo:  Not Visualized. Subchorionic hemorrhage:  None visualized. Maternal uterus/adnexae: The right ovary was not visualized. The left ovary is normal in appearance measuring 2.0 x 2.7 x 2.4 cm. No free fluid is noted. IMPRESSION: No intrauterine pregnancy visualized. This may be suspicious for failed pregnancy or early gestation. Would recommend follow-up quantitative B-HCG levels and follow-up US in 14 days to assess viability. This recommendation follows SRU consensus guidelines: Diagnostic Criteria for Nonviable Pregnancy Early in the First Trimester. Malva Limes Med 2013; 621:3086-57. Electronically Signed   By: Jonna Clark M.D.   On: 03/31/2020 00:00    Assessment/Plan  Principal Problem Acute blood loss anemia -Likely has a component of iron deficiency anemia given microcytosis.  Obtain anemia panel in the morning, repeat CBC in the morning.  Transfuse for hemoglobin greater than 7.  Active Problems Pregnancy /likely miscarriage -OB/GYN consulted by EDP, Dr. Don Perking discussed with Genia Del CNM, ultrasound did not show any intrauterine pregnancy suggesting failed pregnancy or early gestation.  Recommending to repeat an hCG within 24 hours if she is to remain in the hospital, but otherwise she will need outpatient follow-up with OB/GYN  Type 2 diabetes mellitus -Start sliding scale insulin, most recent A1c done in January 2021 was 14.9.  Repeat an A1c  Obesity -Based on a BMI of 47, patient would benefit from weight loss  Hypokalemia -Replete potassium and repeat in the morning  DVT prophylaxis: SCDs  Code Status: Full code  Family Communication: husband at bedside  Disposition Plan: home when ready  Bed Type: Medsurg Consults called: ObGyn by EDP  Obs/Inp: observation  Pamella Pert, MD, PhD [facility]  Contact via [email or web address]  03/31/2020,  12:55 AM

## 2020-03-31 NOTE — Progress Notes (Signed)
Pt discharged home.  Discharge instructions, prescriptions and follow up appointment given to and reviewed with pt.  Pt verbalized understanding.  Escorted by auxillary. 

## 2020-04-03 ENCOUNTER — Telehealth: Payer: Self-pay

## 2020-04-03 ENCOUNTER — Telehealth: Payer: Self-pay | Admitting: General Practice

## 2020-04-03 NOTE — Telephone Encounter (Signed)
Explained eligibility requirements and paperwork to pt. 4/29 @ 10:35 - MD

## 2020-04-03 NOTE — Telephone Encounter (Signed)
Called pt on 4/29 at 2:53pm to go over application process - lvm with information to give [place] a call back.

## 2020-04-08 ENCOUNTER — Telehealth: Payer: Self-pay | Admitting: Gerontology

## 2020-04-08 NOTE — Telephone Encounter (Signed)
Called pt at 1:45 and LVM. Told pt we are available to answer questions regarding the elig process. Left callback number - NU

## 2020-04-10 ENCOUNTER — Ambulatory Visit (INDEPENDENT_AMBULATORY_CARE_PROVIDER_SITE_OTHER): Payer: Self-pay | Admitting: Obstetrics and Gynecology

## 2020-04-10 ENCOUNTER — Telehealth: Payer: Self-pay | Admitting: General Practice

## 2020-04-10 ENCOUNTER — Encounter: Payer: Self-pay | Admitting: Obstetrics and Gynecology

## 2020-04-10 ENCOUNTER — Other Ambulatory Visit: Payer: Self-pay

## 2020-04-10 VITALS — BP 109/69 | HR 94 | Ht 65.0 in | Wt 292.4 lb

## 2020-04-10 DIAGNOSIS — D62 Acute posthemorrhagic anemia: Secondary | ICD-10-CM

## 2020-04-10 DIAGNOSIS — N926 Irregular menstruation, unspecified: Secondary | ICD-10-CM

## 2020-04-10 DIAGNOSIS — O039 Complete or unspecified spontaneous abortion without complication: Secondary | ICD-10-CM

## 2020-04-10 DIAGNOSIS — Z6841 Body Mass Index (BMI) 40.0 and over, adult: Secondary | ICD-10-CM

## 2020-04-10 DIAGNOSIS — E1165 Type 2 diabetes mellitus with hyperglycemia: Secondary | ICD-10-CM

## 2020-04-10 DIAGNOSIS — E1100 Type 2 diabetes mellitus with hyperosmolarity without nonketotic hyperglycemic-hyperosmolar coma (NKHHC): Secondary | ICD-10-CM

## 2020-04-10 NOTE — Patient Instructions (Signed)
Preparing for Pregnancy °If you are considering becoming pregnant, make an appointment to see your regular health care provider to learn how to prepare for a safe and healthy pregnancy (preconception care). During a preconception care visit, your health care provider will: °· Do a complete physical exam, including a Pap test. °· Take a complete medical history. °· Give you information, answer your questions, and help you resolve problems. °Preconception checklist °Medical history °· Tell your health care provider about any current or past medical conditions. Your pregnancy or your ability to become pregnant may be affected by chronic conditions, such as diabetes, chronic hypertension, and thyroid problems. °· Include your family's medical history as well as your partner's medical history. °· Tell your health care provider about any history of STIs (sexually transmitted infections). These can affect your pregnancy. In some cases, they can be passed to your baby. Discuss any concerns that you have about STIs. °· If indicated, discuss the benefits of genetic testing. This testing will show whether there are any genetic conditions that may be passed from you or your partner to your baby. °· Tell your health care provider about: °? Any problems you have had with conception or pregnancy. °? Any medicines you take. These include vitamins, herbal supplements, and over-the-counter medicines. °? Your history of immunizations. Discuss any vaccinations that you may need. °Diet °· Ask your health care provider what to include in a healthy diet that has a balance of nutrients. This is especially important when you are pregnant or preparing to become pregnant. °· Ask your health care provider to help you reach a healthy weight before pregnancy. °? If you are overweight, you may be at higher risk for certain complications, such as high blood pressure, diabetes, and preterm birth. °? If you are underweight, you are more likely to  have a baby who has a low birth weight. °Lifestyle, work, and home °· Let your health care provider know: °? About any lifestyle habits that you have, such as alcohol use, drug use, or smoking. °? About recreational activities that may put you at risk during pregnancy, such as downhill skiing and certain exercise programs. °? Tell your health care provider about any international travel, especially any travel to places with an active Zika virus outbreak. °? About harmful substances that you may be exposed to at work or at home. These include chemicals, pesticides, radiation, or even litter boxes. °? If you do not feel safe at home. °Mental health °· Tell your health care provider about: °? Any history of mental health conditions, including feelings of depression, sadness, or anxiety. °? Any medicines that you take for a mental health condition. These include herbs and supplements. °Home instructions to prepare for pregnancy °Lifestyle ° °· Eat a balanced diet. This includes fresh fruits and vegetables, whole grains, lean meats, low-fat dairy products, healthy fats, and foods that are high in fiber. Ask to meet with a nutritionist or registered dietitian for assistance with meal planning and goals. °· Get regular exercise. Try to be active for at least 30 minutes a day on most days of the week. Ask your health care provider which activities are safe during pregnancy. °· Do not use any products that contain nicotine or tobacco, such as cigarettes and e-cigarettes. If you need help quitting, ask your health care provider. °· Do not drink alcohol. °· Do not take illegal drugs. °· Maintain a healthy weight. Ask your health care provider what weight range is right for you. °General   instructions °· Keep an accurate record of your menstrual periods. This makes it easier for your health care provider to determine your baby's due date. °· Begin taking prenatal vitamins and folic acid supplements daily as directed by your  health care provider. °· Manage any chronic conditions, such as high blood pressure and diabetes, as told by your health care provider. This is important. °How do I know that I am pregnant? °You may be pregnant if you have been sexually active and you miss your period. Symptoms of early pregnancy include: °· Mild cramping. °· Very light vaginal bleeding (spotting). °· Feeling unusually tired. °· Nausea and vomiting (morning sickness). °If you have any of these symptoms and you suspect that you might be pregnant, you can take a home pregnancy test. These tests check for a hormone in your urine (human chorionic gonadotropin, or hCG). A woman's body begins to make this hormone during early pregnancy. These tests are very accurate. Wait until at least the first day after you miss your period to take one. If the test shows that you are pregnant (you get a positive result), call your health care provider to make an appointment for prenatal care. °What should I do if I become pregnant? ° °  ° °· Make an appointment with your health care provider as soon as you suspect you are pregnant. °· Do not use any products that contain nicotine, such as cigarettes, chewing tobacco, and e-cigarettes. If you need help quitting, ask your health care provider. °· Do not drink alcoholic beverages. Alcohol is related to a number of birth defects. °· Avoid toxic odors and chemicals. °· You may continue to have sexual intercourse if it does not cause pain or other problems, such as vaginal bleeding. °This information is not intended to replace advice given to you by your health care provider. Make sure you discuss any questions you have with your health care provider. °Document Revised: 11/24/2017 Document Reviewed: 06/13/2016 °Elsevier Patient Education © 2020 Elsevier Inc. ° °

## 2020-04-10 NOTE — Progress Notes (Signed)
Pt present today due to possible miscarriage.  upt neg.

## 2020-04-10 NOTE — Progress Notes (Signed)
GYNECOLOGY PROGRESS NOTE  Subjective:    Patient ID: Robin Houston, female    DOB: Nov 25, 1996, 24 y.o.   MRN: [id number]  HPI  Patient is a 24 y.o. G37P0010 female who presents for follow up after a miscarriage.  She has a past medical history of diabetes mellitus (newly diagnosed in January 2021). The patient was admitted 03/31/2020 for monitoring of anemia and heavy vaginal bleeding. She had had several positive pregnancy tests at home prior to this. Ultrasound showed no evidence of IUP consistent with early pregnancy loss. Her last hCG was at that time was19. The patient states the bleeding and cramping has completely resolved at this time. She and her husband would like to conceive again. The patient reports a family history of diabetes, her husband reports family history of DM, HTN. Denies any other history of genetic conditions.    GYN History:  Patient's last menstrual period was 02/21/2020 (approximate). Menarche: age 5. She reports having of irregular menstrual cycles since onset of mesnes.  Last pap smear: patient has never had one.     OB History  Gravida Para Term Preterm AB Living  1       1    SAB TAB Ectopic Multiple Live Births  1            # Outcome Date GA Lbr Len/2nd Weight Sex Delivery Anes PTL Lv  1 SAB 02/2020             Past Medical History:  Diagnosis Date  . Diabetes mellitus without complication (HCC)    type 2    Family History  Problem Relation Age of Onset  . Thyroid disease Mother   . Diabetes Father   . Diabetes Paternal Grandmother     Past Surgical History:  Procedure Laterality Date  . no surgical history      Social History   Socioeconomic History  . Marital status: Single    Spouse name: Not on file  . Number of children: Not on file  . Years of education: Not on file  . Highest education level: Not on file  Occupational History  . Not on file  Tobacco Use  . Smoking status: Never Smoker  . Smokeless tobacco:  Never Used  Substance and Sexual Activity  . Alcohol use: Never  . Drug use: Never  . Sexual activity: Yes  Other Topics Concern  . Not on file  Social History Narrative  . Not on file   Social Determinants of Health   Financial Resource Strain:   . Difficulty of Paying Living Expenses:   Food Insecurity:   . Worried About Programme researcher, broadcasting/film/video in the Last Year:   . Barista in the Last Year:   Transportation Needs:   . Freight forwarder (Medical):   Marland Kitchen Lack of Transportation (Non-Medical):   Physical Activity:   . Days of Exercise per Week:   . Minutes of Exercise per Session:   Stress:   . Feeling of Stress :   Social Connections:   . Frequency of Communication with Friends and Family:   . Frequency of Social Gatherings with Friends and Family:   . Attends Religious Services:   . Active Member of Clubs or Organizations:   . Attends Banker Meetings:   Marland Kitchen Marital Status:   Intimate Partner Violence:   . Fear of Current or Ex-Partner:   . Emotionally Abused:   Marland Kitchen Physically Abused:   .  Sexually Abused:     Current Outpatient Medications on File Prior to Visit  Medication Sig Dispense Refill  . metFORMIN (GLUCOPHAGE) 1000 MG tablet Take 1,000 mg by mouth 2 (two) times daily.     No current facility-administered medications on file prior to visit.    No Known Allergies   Review of Systems Pertinent items noted in HPI and remainder of comprehensive ROS otherwise negative.   Objective:   Blood pressure 109/69, pulse 94, height 5\' 5"  (1.651 m), weight 132.6 kg, last menstrual period 02/21/2020. General appearance: alert, cooperative, appears stated age and no distress Abdomen: soft, non-tender; bowel sounds normal; no masses,  no organomegaly Pelvic: Not performed Extremities: extremities normal, atraumatic, no cyanosis or edema Neurologic: Grossly normal   Labs:  Lab Results  Component Value Date   WBC 11.4 (H) 03/31/2020   HGB 8.7 (L)  03/31/2020   HCT 30.0 (L) 03/31/2020   MCV 71.6 (L) 03/31/2020   PLT 497 (H) 03/31/2020    Lab Results  Component Value Date   HGBA1C 6.6 (H) 03/31/2020   Results for Robin Houston, Robin Houston (MRN [id number]) as of 04/10/2020 23:02  Ref. Range 03/30/2020 23:26 03/31/2020 03:29 03/31/2020 03:29  HCG, Beta Chain, Quant, S Latest Ref Range: <5 mIU/mL 27 (H) 19 (H)    Assessment:   History of spontaneous abortion Irregular menses Diabetes Mellitus, type II Anemia   Plan:   1. History of recent miscarriage The patient and her husband would like to conceive again. We discussed that patient should wait for one normal menstrual cycle prior to trying to conceive again. Encouraged patient to continue taking prenatal vitamins and start taking additional folic acid. Discussed optimizing health prior to conception.  2. Irregular menses  Irregular menses and history of insulin resistance concerning for PCOS. Labs ordered- DHEA sulfate, FSH, Testosterone, Progesterone. If PCOS, will need to have further discussion regarding fertility. If otherwise anovulatory, can help to induce regular cycles as patient does desires to conceive again.   3. Diabetes Mellitus  Last A1C was 6.6% while in the hospital with anemia. Her initial A1C of 14.9% 12/2019 seen in [place]. Patient states she is walking daily for exercise and has been working on dietary changes to continue improving her glycemic control. She is currently on metformin and notes compliance with medications.   4. Anemia Last hemoglobin was 8.7. likely secondary to recent episode of bleeding. Encouraged patient to take a daily OTC iron supplement.   5. Morbid obesity  - See above discussion on optimizing health prior to attempting to conceive again.   Follow up in 1-2 months for annual exam.     I have seen and examined the patient with Blair Heys, [place] PA-S.  I have reviewed the record and concur with patient management and  plan.   Rubie Maid, MD [facility]

## 2020-04-10 NOTE — Telephone Encounter (Signed)
LVM 5/6 @ 11:17 - MD

## 2020-04-13 LAB — DHEA-SULFATE: DHEA-SO4: 100 ug/dL — ABNORMAL LOW (ref 110.0–431.7)

## 2020-04-13 LAB — TESTOSTERONE, FREE, TOTAL, SHBG
Sex Hormone Binding: 14.8 nmol/L — ABNORMAL LOW (ref 24.6–122.0)
Testosterone, Free: 0.9 pg/mL (ref 0.0–4.2)
Testosterone: 5 ng/dL — ABNORMAL LOW (ref 8–48)

## 2020-04-13 LAB — FOLLICLE STIMULATING HORMONE: FSH: 7.3 m[IU]/mL

## 2020-04-13 LAB — PROGESTERONE: Progesterone: 0.1 ng/mL

## 2020-06-11 ENCOUNTER — Encounter: Payer: Self-pay | Admitting: Obstetrics and Gynecology

## 2020-06-18 ENCOUNTER — Encounter: Payer: Self-pay | Admitting: Obstetrics and Gynecology

## 2020-06-24 ENCOUNTER — Telehealth: Payer: Self-pay | Admitting: General Practice

## 2020-06-24 NOTE — Telephone Encounter (Signed)
Individual has been contacted 3+ times regarding ED referral. No further attempts to contact individual will be made. 

## 2020-07-24 ENCOUNTER — Emergency Department: Payer: Self-pay

## 2020-07-24 ENCOUNTER — Other Ambulatory Visit: Payer: Self-pay

## 2020-07-24 ENCOUNTER — Telehealth: Payer: Self-pay | Admitting: Obstetrics and Gynecology

## 2020-07-24 ENCOUNTER — Emergency Department
Admission: EM | Admit: 2020-07-24 | Discharge: 2020-07-24 | Disposition: A | Discharge status: Home / Self Care | Payer: Self-pay | Attending: Emergency Medicine | Admitting: Emergency Medicine

## 2020-07-24 DIAGNOSIS — O209 Hemorrhage in early pregnancy, unspecified: Secondary | ICD-10-CM

## 2020-07-24 DIAGNOSIS — Z7984 Long term (current) use of oral hypoglycemic drugs: Secondary | ICD-10-CM | POA: Insufficient documentation

## 2020-07-24 DIAGNOSIS — Z3A01 Less than 8 weeks gestation of pregnancy: Secondary | ICD-10-CM | POA: Insufficient documentation

## 2020-07-24 DIAGNOSIS — R1030 Lower abdominal pain, unspecified: Secondary | ICD-10-CM

## 2020-07-24 DIAGNOSIS — O200 Threatened abortion: Secondary | ICD-10-CM | POA: Insufficient documentation

## 2020-07-24 DIAGNOSIS — E119 Type 2 diabetes mellitus without complications: Secondary | ICD-10-CM | POA: Insufficient documentation

## 2020-07-24 LAB — BASIC METABOLIC PANEL
Anion gap: 11 (ref 5–15)
BUN: 16 mg/dL (ref 6–20)
CO2: 26 mmol/L (ref 22–32)
Calcium: 8.9 mg/dL (ref 8.9–10.3)
Chloride: 101 mmol/L (ref 98–111)
Creatinine, Ser: 0.7 mg/dL (ref 0.44–1.00)
GFR calc Af Amer: >60 mL/min (ref >60)
GFR calc non Af Amer: >60 mL/min (ref >60)
Glucose, Bld: 123 mg/dL — ABNORMAL HIGH (ref 70–99)
Potassium: 3.7 mmol/L (ref 3.5–5.1)
Sodium: 138 mmol/L (ref 135–145)

## 2020-07-24 LAB — POCT PREGNANCY, URINE: Preg Test, Ur: POSITIVE — AB

## 2020-07-24 LAB — CBC
HCT: 31.8 % — ABNORMAL LOW (ref 36.0–46.0)
Hemoglobin: 8.9 g/dL — ABNORMAL LOW (ref 12.0–15.0)
MCH: 19.3 pg — ABNORMAL LOW (ref 26.0–34.0)
MCHC: 28 g/dL — ABNORMAL LOW (ref 30.0–36.0)
MCV: 69.1 fL — ABNORMAL LOW (ref 80.0–100.0)
Platelets: 444 10*3/uL — ABNORMAL HIGH (ref 150–400)
RBC: 4.6 MIL/uL (ref 3.87–5.11)
RDW: 19.1 % — ABNORMAL HIGH (ref 11.5–15.5)
WBC: 10.2 10*3/uL (ref 4.0–10.5)
nRBC: 0 % (ref 0.0–0.2)

## 2020-07-24 LAB — HCG, QUANTITATIVE, PREGNANCY: hCG, Beta Chain, Quant, S: 26 m[IU]/mL — ABNORMAL HIGH (ref <5)

## 2020-07-24 NOTE — Discharge Instructions (Signed)
Follow-up with [facility] for a recheck in 1 week.  Please call for appointment.  You need repeat serial beta hCGs.  Return to the emergency department if worsening.

## 2020-07-24 NOTE — Telephone Encounter (Signed)
Pt called in and stated that she missed a call. I let her know why we were calling and she stated she didn't want a appointment and to also cancel her appointment on 08/05/2020.

## 2020-07-24 NOTE — Telephone Encounter (Signed)
Hello Tab, Will you please call the pt and see if she can come in at 4pm today if not schedule her for one day next week. I see she is at the hospital right now. Thanks [employer]

## 2020-07-24 NOTE — ED Triage Notes (Addendum)
Pt had positive pregnancy test yesterday, started with mild spotting X 2 days ago, mild cramping. Last period 7/18

## 2020-07-24 NOTE — ED Provider Notes (Signed)
[facility] Emergency Department Provider Note  ____________________________________________   First MD Initiated Contact with Patient 07/24/20 1640     (approximate)  I have reviewed the triage vital signs and the nursing notes.   HISTORY  Chief Complaint Vaginal Bleeding    HPI Robin Houston is a 24 y.o. female presents emergency department stating she thinks she is approximately 4 to [redacted] weeks pregnant and started having vaginal bleeding yesterday and today.  Some cramping.  No fever or chills.  Patient's blood type is a positive as she has had to have blood transfusions previously.  She also had a miscarriage earlier this year.  She is followed [facility]    Past Medical History:  Diagnosis Date  . Diabetes mellitus without complication (HCC)    type 2    Patient Active Problem List   Diagnosis Date Noted  . Symptomatic anemia 03/31/2020    Past Surgical History:  Procedure Laterality Date  . no surgical history      Prior to Admission medications   Medication Sig Start Date End Date Taking? Authorizing Provider  metFORMIN (GLUCOPHAGE) 1000 MG tablet Take 1,000 mg by mouth 2 (two) times daily. 01/07/20   [provider]    Allergies Patient has no known allergies.  Family History  Problem Relation Age of Onset  . Thyroid disease Mother   . Diabetes Father   . Diabetes Paternal Grandmother     Social History Social History   Tobacco Use  . Smoking status: Never Smoker  . Smokeless tobacco: Never Used  Vaping Use  . Vaping Use: Never used  Substance Use Topics  . Alcohol use: Never  . Drug use: Never    Review of Systems  Constitutional: No fever/chills Eyes: No visual changes. ENT: No sore throat. Respiratory: Denies cough Cardiovascular: Denies chest pain Gastrointestinal: Denies abdominal pain Genitourinary: Negative for dysuria.  Positive for vaginal bleeding Musculoskeletal:  Negative for back pain. Skin: Negative for rash. Psychiatric: no mood changes,     ____________________________________________   PHYSICAL EXAM:  VITAL SIGNS: ED Triage Vitals  Enc Vitals Group     BP 07/24/20 1239 124/61     Pulse Rate 07/24/20 1239 97     Resp 07/24/20 1239 17     Temp 07/24/20 1239 98.7 F (37.1 C)     Temp Source 07/24/20 1239 Oral     SpO2 07/24/20 1239 99 %     Weight 07/24/20 1254 300 lb (136.1 kg)     Height 07/24/20 1254 5\' 5"  (1.651 m)     Head Circumference --      Peak Flow --      Pain Score 07/24/20 1254 0     Pain Loc --      Pain Edu? --      Excl. in GC? --     Constitutional: Alert and oriented. Well appearing and in no acute distress. Eyes: Conjunctivae are normal.  Head: Atraumatic. Nose: No congestion/rhinnorhea. Mouth/Throat: Mucous membranes are moist.   Neck:  supple no lymphadenopathy noted Cardiovascular: Normal rate, regular rhythm. Respiratory: Normal respiratory effort.  No retractions, l Abd: soft nontender bs normal all 4 quad GU: deferred by the patient Musculoskeletal: FROM all extremities, warm and well perfused Neurologic:  Normal speech and language.  Skin:  Skin is warm, dry and intact.  No rash noted Psychiatric: Mood and affect are normal. Speech and behavior are normal.  ____________________________________________   LABS (  all labs ordered are listed, but only abnormal results are displayed)  Labs Reviewed  HCG, QUANTITATIVE, PREGNANCY - Abnormal; Notable for the following components:      Result Value   hCG, Beta Chain, Quant, S 26 (*)    All other components within normal limits  CBC - Abnormal; Notable for the following components:   Hemoglobin 8.9 (*)    HCT 31.8 (*)    MCV 69.1 (*)    MCH 19.3 (*)    MCHC 28.0 (*)    RDW 19.1 (*)    Platelets 444 (*)    All other components within normal limits  BASIC METABOLIC PANEL - Abnormal; Notable for the following components:   Glucose, Bld 123 (*)      All other components within normal limits  POCT PREGNANCY, URINE - Abnormal; Notable for the following components:   Preg Test, Ur POSITIVE (*)    All other components within normal limits  POC URINE PREG, ED  SAMPLE TO BLOOD BANK   ____________________________________________   ____________________________________________  RADIOLOGY  Ultrasound OB less than 14 weeks does not show an IUP or any sign of a fetus  ____________________________________________   PROCEDURES  Procedure(s) performed: No  Procedures    ____________________________________________   INITIAL IMPRESSION / ASSESSMENT AND PLAN / ED COURSE  Pertinent labs & imaging results that were available during my care of the patient were reviewed by me and considered in my medical decision making (see chart for details).   Patient is 24 year old female presents emergency department with threatened miscarriage.  See HPI  Physical exam is unremarkable  DDx: Early pregnancy, threatened miscarriage, subchorionic hematoma  Beta-hCG is 26, basic metabolic panel is normal, urine pregnancy is positive, CBC has decreased hemoglobin which is normal for this patient.  Ultrasound does not show IUP.  Did explain the findings to the patient.  Due to her low numbers I feel she most likely is having a miscarriage, however do want her to follow-up with Dr. Valentino Saxon and have repeat beta is done in 1 week.  Return emergency department worsening.  She was also instructed to start taking iron supplements.  Due to her past medical chart showing that her blood type is a positive she does not need RhoGam.  She was discharged stable condition.     [name] was evaluated in Emergency Department on 07/24/2020 for the symptoms described in the history of present illness. She was evaluated in the context of the global COVID-19 pandemic, which necessitated consideration that thepatient might be at risk for infection with the  SARS-CoV-2 virus that causes COVID-19. Institutional protocols and algorithms that pertain to the evaluation of patients at risk for COVID-19 are in a state of rapid change based on information released by regulatory bodies including the CDC and federal and state organizations. These policies and algorithms were followed during the patient's care in the ED.    As part of my medical decision making, I reviewed the following data within the electronic MEDICAL RECORD NUMBER History obtained from family, Nursing notes reviewed and incorporated, Labs reviewed , Old chart reviewed, Radiograph reviewed , Notes from prior ED visits and [place] Controlled Substance Database  ____________________________________________   FINAL CLINICAL IMPRESSION(S) / ED DIAGNOSES  Final diagnoses:  Threatened miscarriage      NEW MEDICATIONS STARTED DURING THIS VISIT:  Discharge Medication List as of 07/24/2020  5:08 PM       Note:  This document was prepared using Dragon voice recognition  software and may include unintentional dictation errors.    Faythe Ghee, PA-C 07/24/20 Jefferey Pica, MD 07/24/20 Serena Croissant

## 2020-07-24 NOTE — Telephone Encounter (Signed)
Called patient to get her scheduled patients phone number went straight to voicemail. Left message for her to call the office so that we can get her scheduled.

## 2020-07-24 NOTE — Telephone Encounter (Signed)
Patient called in stating she was wanting to come in for a pregnancy confirmation however she stated that she was having vaginal cramping and some vaginal spotting. Could you please advise?

## 2020-08-01 ENCOUNTER — Ambulatory Visit (LOCAL_COMMUNITY_HEALTH_CENTER): Payer: Self-pay

## 2020-08-01 ENCOUNTER — Other Ambulatory Visit: Payer: Self-pay

## 2020-08-01 DIAGNOSIS — Z3200 Encounter for pregnancy test, result unknown: Secondary | ICD-10-CM

## 2020-08-01 LAB — PREGNANCY, URINE

## 2020-08-01 NOTE — Progress Notes (Signed)
Pt's UPT results were inconclusive; "HCG level appears near threshold for test. Please resubmit in 48 to 72 hours."  Pt to RTC on 08/05/20 to repeat UPT at no charge.

## 2020-08-05 ENCOUNTER — Encounter: Payer: Self-pay | Admitting: Obstetrics and Gynecology

## 2020-08-05 ENCOUNTER — Ambulatory Visit (LOCAL_COMMUNITY_HEALTH_CENTER): Payer: Self-pay

## 2020-08-05 ENCOUNTER — Other Ambulatory Visit: Payer: Self-pay

## 2020-08-05 VITALS — BP 125/80 | Ht 65.0 in | Wt 305.5 lb

## 2020-08-05 DIAGNOSIS — Z3202 Encounter for pregnancy test, result negative: Secondary | ICD-10-CM

## 2020-08-05 LAB — PREGNANCY, URINE: Preg Test, Ur: NEGATIVE

## 2020-08-05 NOTE — Progress Notes (Addendum)
UPT negative today. Pt reports she had positive home preg test 3 weeks ago and then one week later had bright red bleeding with clots x1 day. On 08/04/2020, pt reports bright red bleeding x 1 day.No complaints of abd pain.  Consult with Hazle Coca, CNM who says pt may have had a possible miscarriage. Advises pt to seek immediate medical attention if has bleeding heavier than a period and saturates a pad in one hour or less. Recommends pt to have physical exam. Also advises pt to abstain from sex as body heals. RN counseled pt based on provider recommendations. Encouraged pt to schedule Women Health appt at [facility] as she does not have PCP. Provider resource list given. Pt in agreement. Questions answered and reports understanding. Jerel Shepherd, RN   Agree with RN consult

## 2020-08-14 ENCOUNTER — Encounter: Payer: Self-pay | Admitting: Obstetrics and Gynecology

## 2020-08-20 NOTE — Progress Notes (Signed)
Consulted by RN regarding pt situation.  Have reviewed RN note and agree with plan of care

## 2021-03-24 ENCOUNTER — Other Ambulatory Visit: Payer: Self-pay

## 2021-03-24 ENCOUNTER — Ambulatory Visit (LOCAL_COMMUNITY_HEALTH_CENTER): Payer: Self-pay

## 2021-03-24 VITALS — BP 130/81 | Ht 66.0 in | Wt 304.0 lb

## 2021-03-24 DIAGNOSIS — Z3201 Encounter for pregnancy test, result positive: Secondary | ICD-10-CM

## 2021-03-24 LAB — PREGNANCY, URINE: Preg Test, Ur: POSITIVE — AB

## 2021-03-24 MED ORDER — PRENATAL 27-0.8 MG PO TABS: 1.0000 | ORAL_TABLET | Freq: Every day | ORAL | 0 refills | Status: AC

## 2021-03-24 NOTE — Progress Notes (Signed)
UPT positive today. Plans prenatal care at [facility]. LMP 11/17/2020.  Pt reports taking Metformin for diabetes. RN counseled pt to establish prenatal care ASAP and to contact prescribing provider for Metformin for guidance on adjustments to medication while preg. Pt in agreement and plans to call [facility] and PCP today. To DSS clerk for Medicaid/Preg women. Jerel Shepherd, RN

## 2021-04-01 ENCOUNTER — Telehealth: Payer: Self-pay

## 2021-04-01 NOTE — Telephone Encounter (Signed)
Pt calling; has appt on the 4th at 1:30; started spotting the other day; very light; should she be concerned?  [phone]  Pt states spotting started Monday am and is now gone; states spotting started within 24 hrs after IC; adv probably from IC;  Not uncommon to have some spotting, a lot do;  Adv to be seen if flow is like a period and or cramping makes her double over. Pt reassured.

## 2021-04-08 ENCOUNTER — Encounter: Payer: Self-pay | Admitting: Obstetrics and Gynecology

## 2021-04-08 ENCOUNTER — Other Ambulatory Visit: Payer: Self-pay

## 2021-04-08 ENCOUNTER — Other Ambulatory Visit (HOSPITAL_COMMUNITY)
Admission: RE | Admit: 2021-04-08 | Discharge: 2021-04-08 | Disposition: A | Discharge status: Home / Self Care | Payer: Self-pay | Source: Ambulatory Visit | Attending: Obstetrics and Gynecology | Admitting: Obstetrics and Gynecology

## 2021-04-08 ENCOUNTER — Ambulatory Visit (INDEPENDENT_AMBULATORY_CARE_PROVIDER_SITE_OTHER): Payer: Medicaid Other | Admitting: Obstetrics and Gynecology

## 2021-04-08 VITALS — BP 120/70 | Ht 66.0 in | Wt 304.4 lb

## 2021-04-08 DIAGNOSIS — O24119 Pre-existing diabetes mellitus, type 2, in pregnancy, unspecified trimester: Secondary | ICD-10-CM | POA: Insufficient documentation

## 2021-04-08 DIAGNOSIS — O09892 Supervision of other high risk pregnancies, second trimester: Secondary | ICD-10-CM

## 2021-04-08 DIAGNOSIS — O0990 Supervision of high risk pregnancy, unspecified, unspecified trimester: Secondary | ICD-10-CM | POA: Insufficient documentation

## 2021-04-08 DIAGNOSIS — Z1379 Encounter for other screening for genetic and chromosomal anomalies: Secondary | ICD-10-CM

## 2021-04-08 LAB — OB RESULTS CONSOLE VARICELLA ZOSTER ANTIBODY, IGG: Varicella: IMMUNE

## 2021-04-08 NOTE — Progress Notes (Signed)
04/08/2021   Chief Complaint: Missed period  Transfer of Care Patient: no  History of Present Illness: [name] is a 25 y.o. G3P0020 [redacted]w[redacted]d based on Patient's last menstrual period was 11/17/2020 (exact date). with an Estimated Date of Delivery: 08/24/21, with the above CC.   Her periods were: monthly.  She report she first positive pregnancy test was 3 weeks ago.  She was using no method when she conceived.  She has Positive signs or symptoms of nausea/vomiting of pregnancy. She has Negative signs or symptoms of miscarriage or preterm labor She was not taking different medications around the time she conceived/early pregnancy. Since her LMP, she has not used alcohol Since her LMP, she has not used tobacco products Since her LMP, she has not used illegal drugs.    Current or past history of domestic violence. no  Infection History:  1. Since her LMP, she has not had a viral illness.  2. She denies close contact with children on a regular basis     3. She denies a history of chicken pox. She denies vaccination for chicken pox in the past. 4. Patient or partner has history of genital herpes  no 5. History of STI (GC, CT, HPV, syphilis, HIV)  no   6.  She does not live with someone with TB or TB exposed. 7. History of recent travel :  no 8. She identifies Negative Zika risk factors for her and her partner 7. There are not cats in the home in the home.  She understands that while pregnant she should not change cat litter.   Genetic Screening Questions: (Includes patient, baby's father, or anyone in either family)   1. Patient's age >/= 35 at [facility]  no 2. Thalassemia ([place], [place], Mediterranean, or Asian background): MCV<80  no 3. Neural tube defect (meningomyelocele, spina bifida, anencephaly)  no 4. Congenital heart defect  no  5. Down syndrome  no 6. Tay-Sachs (Jewish, [place]  no 7. Canavan's Disease  no 8. Sickle cell disease or trait (African)  no  9. Hemophilia  or other blood disorders  no  10. Muscular dystrophy  no  11. Cystic fibrosis  no  12. Huntington's Chorea  no  13. Mental retardation/autism  no 14. Other inherited genetic or chromosomal disorder  no 15. Maternal metabolic disorder (DM, PKU, etc)  no 16. Patient or FOB with a child with a birth defect not listed above no  16a. Patient or FOB with a birth defect themselves no 17. Recurrent pregnancy loss, or stillbirth  no  18. Any medications since LMP other than prenatal vitamins (include vitamins, supplements, OTC meds, drugs, alcohol)  no 19. Any other genetic/environmental exposure to discuss  no  ROS:  Review of Systems  Constitutional: Negative for chills, fever, malaise/fatigue and weight loss.  HENT: Negative for congestion, hearing loss and sinus pain.   Eyes: Negative for blurred vision and double vision.  Respiratory: Negative for cough, sputum production, shortness of breath and wheezing.   Cardiovascular: Negative for chest pain, palpitations, orthopnea and leg swelling.  Gastrointestinal: Negative for abdominal pain, constipation, diarrhea, nausea and vomiting.  Genitourinary: Negative for dysuria, flank pain, frequency, hematuria and urgency.  Musculoskeletal: Negative for back pain, falls and joint pain.  Skin: Negative for itching and rash.  Neurological: Negative for dizziness and headaches.  Psychiatric/Behavioral: Negative for depression, substance abuse and suicidal ideas. The patient is not nervous/anxious.     OBGYN History: As per HPI. OB History  [place]  Para Term Preterm AB Living  3       2    SAB IAB Ectopic Multiple Live Births  2            # Outcome Date GA Lbr Len/2nd Weight Sex Delivery Anes PTL Lv  3 Current           2 SAB 07/22/20          1 SAB 02/2020            Any issues with any prior pregnancies: no Any prior children are healthy, doing well, without any problems or issues: not applicable Last pap smear unknown History of STIs:  No   Past Medical History: Past Medical History:  Diagnosis Date  . Diabetes mellitus without complication (HCC)    type 2    Past Surgical History: Past Surgical History:  Procedure Laterality Date  . no surgical history      Family History:  Family History  Problem Relation Age of Onset  . Thyroid disease Mother   . Diabetes Father   . Diabetes Paternal Grandmother    She denies any female cancers, bleeding or blood clotting disorders.   Social History:  Social History   Socioeconomic History  . Marital status: Single    Spouse name: Not on file  . Number of children: Not on file  . Years of education: Not on file  . Highest education level: Not on file  Occupational History  . Not on file  Tobacco Use  . Smoking status: Never Smoker  . Smokeless tobacco: Never Used  Vaping Use  . Vaping Use: Never used  Substance and Sexual Activity  . Alcohol use: Never  . Drug use: Never  . Sexual activity: Yes    Birth control/protection: None  Other Topics Concern  . Not on file  Social History Narrative  . Not on file   Social Determinants of Health   Financial Resource Strain: Not on file  Food Insecurity: Not on file  Transportation Needs: Not on file  Physical Activity: Not on file  Stress: Not on file  Social Connections: Not on file  Intimate Partner Violence: Not At Risk  . Fear of Current or Ex-Partner: No  . Emotionally Abused: No  . Physically Abused: No  . Sexually Abused: No    Allergy: No Known Allergies  Current Outpatient Medications:  Current Outpatient Medications:  .  metFORMIN (GLUCOPHAGE) 1000 MG tablet, Take 1,000 mg by mouth 2 (two) times daily., Disp: , Rfl:  .  Prenatal Vit-Fe Fumarate-FA (MULTIVITAMIN-PRENATAL) 27-0.8 MG TABS tablet, Take 1 tablet by mouth daily at 12 noon., Disp: 100 tablet, Rfl: 0   Physical Exam: Physical Exam Vitals and nursing note reviewed.  HENT:     Head: Normocephalic and atraumatic.  Eyes:      Pupils: Pupils are equal, round, and reactive to light.  Neck:     Thyroid: No thyromegaly.  Cardiovascular:     Rate and Rhythm: Normal rate and regular rhythm.  Pulmonary:     Effort: Pulmonary effort is normal.  Abdominal:     General: Bowel sounds are normal. There is no distension.     Palpations: Abdomen is soft.     Tenderness: There is no abdominal tenderness. There is no guarding or rebound.  Musculoskeletal:        General: Normal range of motion.     Cervical back: Normal range of motion and neck supple.  Skin:  General: Skin is warm and dry.  Neurological:     Mental Status: She is alert and oriented to person, place, and time.  Psychiatric:        Mood and Affect: Affect normal.        Judgment: Judgment normal.      Assessment: Ms. Yontz is a 25 y.o. G3P0020 [redacted]w[redacted]d based on Patient's last menstrual period was 11/17/2020 (exact date). with an Estimated Date of Delivery: 08/24/21,  for prenatal care.  Plan:  1) Avoid alcoholic beverages. 2) Patient encouraged not to smoke.  3) Discontinue the use of all non-medicinal drugs and chemicals.  4) Take prenatal vitamins daily.  5) Seatbelt use advised 6) Nutrition, food safety (fish, cheese advisories, and high nitrite foods) and exercise discussed. 7) Hospital and practice style delivering at [facility] discussed  8) Patient is asked about travel to areas at risk for the Zika virus, and counseled to avoid travel and exposure to mosquitoes or sexual partners who may have themselves been exposed to the virus. Testing is discussed, and will be ordered as appropriate.  9) Childbirth classes at [facility] advised 10) Genetic Screening, such as with 1st Trimester Screening, cell free fetal DNA, AFP testing, and Ultrasound, as well as with amniocentesis and CVS as appropriate, is discussed with patient. She plans to have genetic testing this pregnancy.  She has type ii diabetes.  She currently takes metformin 500 100 mg once a day.  She  does not consistently check her blood glucose levels.  She reports that her blood glucose sugars are generally 120 before meals. We reviewed glucose control needs are different in pregnancy than outside of pregnancy.  We discussed that she would need stricter control.  We discussed that her global goal glucose values would be less than 95 fasting and less than 120 two hours after meals.  Provided her with glucose log.  She will start checking her sugars more regularly and follow-up at her next appointment.  Discussed that during pregnancy people are sometimes transition to insulin if needed for tight glycemic control.  Discussed that tight glycemic control is important for preventing birth defects such as cardiac malformations or neurological malformations as well as reducing the risk of stillbirth or miscarriage.  We will check hemoglobin A1c today  Discussed her risk factors for preeclampsia during this pregnancy and encouraged the patient to start 81 mg aspirin when she is [redacted] weeks gestation and continue until the time of delivery.  She has been taking extra folic acid and iron already this pregnancy in addition to her prenatal vitamin.  Informal bedside transvaginal ultrasound performed today.  Confirmed intrauterine pregnancy with crown-rump length measuring 6 weeks 6 days.  Fetal heart rate was 148 bpm.  MFM referral for dating [place] and co-managment  Problem list reviewed and updated.  I discussed the assessment and treatment plan with the patient. The patient was provided an opportunity to ask questions and all were answered. The patient agreed with the plan and demonstrated an understanding of the instructions.  Adelene Idler MD [facility], [facility] 04/08/2021 1:46 PM

## 2021-04-08 NOTE — Patient Instructions (Addendum)
Recommended Weight Gain in Pregnancy     BMI    Underweight  Less than 18.5 28-40 lbs Normal weight  18.5-24.9  25-35 lbs Overweight  25-29.9  15-25 lbs Obese   30 and greater  11-20 lbs     Second Trimester of Pregnancy  The second trimester of pregnancy is from week 13 through week 27. This is months 4 through 6 of pregnancy. The second trimester is often a time when you feel your best. Your body has adjusted to being pregnant, and you begin to feel better physically. During the second trimester:  Morning sickness has lessened or stopped completely.  You may have more energy.  You may have an increase in appetite. The second trimester is also a time when the unborn baby (fetus) is growing rapidly. At the end of the sixth month, the fetus may be up to 12 inches long and weigh about 1 pounds. You will likely begin to feel the baby move (quickening) between 16 and 20 weeks of pregnancy. Body changes during your second trimester Your body continues to go through many changes during your second trimester. The changes vary and generally return to normal after the baby is born. Physical changes  Your weight will continue to increase. You will notice your lower abdomen bulging out.  You may begin to get stretch marks on your hips, abdomen, and breasts.  Your breasts will continue to grow and to become tender.  Dark spots or blotches (chloasma or mask of pregnancy) may develop on your face.  A dark line from your belly button to the pubic area (linea nigra) may appear.  You may have changes in your hair. These can include thickening of your hair, rapid growth, and changes in texture. Some people also have hair loss during or after pregnancy, or hair that feels dry or thin. Health changes  You may develop headaches.  You may have heartburn.  You may develop constipation.  You may develop hemorrhoids or swollen, bulging veins (varicose veins).  Your gums may bleed and may be  sensitive to brushing and flossing.  You may urinate more often because the fetus is pressing on your bladder.  You may have back pain. This is caused by: ? Weight gain. ? Pregnancy hormones that are relaxing the joints in your pelvis. ? A shift in weight and the muscles that support your balance. Follow these instructions at home: Medicines  Follow your health care provider's instructions regarding medicine use. Specific medicines may be either safe or unsafe to take during pregnancy. Do not take any medicines unless approved by your health care provider.  Take a prenatal vitamin that contains at least 600 micrograms (mcg) of folic acid. Eating and drinking  Eat a healthy diet that includes fresh fruits and vegetables, whole grains, good sources of protein such as meat, eggs, or tofu, and low-fat dairy products.  Avoid raw meat and unpasteurized juice, milk, and cheese. These carry germs that can harm you and your baby.  You may need to take these actions to prevent or treat constipation: ? Drink enough fluid to keep your urine pale yellow. ? Eat foods that are high in fiber, such as beans, whole grains, and fresh fruits and vegetables. ? Limit foods that are high in fat and processed sugars, such as fried or sweet foods. Activity  Exercise only as directed by your health care provider. Most people can continue their usual exercise routine during pregnancy. Try to exercise for 30 minutes  at least 5 days a week. Stop exercising if you develop contractions in your uterus.  Stop exercising if you develop pain or cramping in the lower abdomen or lower back.  Avoid exercising if it is very hot or humid or if you are at a high altitude.  Avoid heavy lifting.  If you choose to, you may have sex unless your health care provider tells you not to. Relieving pain and discomfort  Wear a supportive bra to prevent discomfort from breast tenderness.  Take warm sitz baths to soothe any pain  or discomfort caused by hemorrhoids. Use hemorrhoid cream if your health care provider approves.  Rest with your legs raised (elevated) if you have leg cramps or low back pain.  If you develop varicose veins: ? Wear support hose as told by your health care provider. ? Elevate your feet for 15 minutes, 3-4 times a day. ? Limit salt in your diet. Safety  Wear your seat belt at all times when driving or riding in a car.  Talk with your health care provider if someone is verbally or physically abusive to you. Lifestyle  Do not use hot tubs, steam rooms, or saunas.  Do not douche. Do not use tampons or scented sanitary pads.  Avoid cat litter boxes and soil used by cats. These carry germs that can cause birth defects in the baby and possibly loss of the fetus by miscarriage or stillbirth.  Do not use herbal remedies, alcohol, illegal drugs, or medicines that are not approved by your health care provider. Chemicals in these products can harm your baby.  Do not use any products that contain nicotine or tobacco, such as cigarettes, e-cigarettes, and chewing tobacco. If you need help quitting, ask your health care provider. General instructions  During a routine prenatal visit, your health care provider will do a physical exam and other tests. He or she will also discuss your overall health. Keep all follow-up visits. This is important.  Ask your health care provider for a referral to a local prenatal education class.  Ask for help if you have counseling or nutritional needs during pregnancy. Your health care provider can offer advice or refer you to specialists for help with various needs. Where to find more information  American Pregnancy Association: americanpregnancy.org  [employer]st[employer]ians and Gynecologists: https://www.todd-brady.net/  Office on [employer] [facility]: MightyReward.co.nz Contact a health care provider if you have:  A headache that does  not go away when you take medicine.  Vision changes or you see spots in front of your eyes.  Mild pelvic cramps, pelvic pressure, or nagging pain in the abdominal area.  Persistent nausea, vomiting, or diarrhea.  A bad-smelling vaginal discharge or foul-smelling urine.  Pain when you urinate.  Sudden or extreme swelling of your face, hands, ankles, feet, or legs.  A fever. Get help right away if you:  Have fluid leaking from your vagina.  Have spotting or bleeding from your vagina.  Have severe abdominal cramping or pain.  Have difficulty breathing.  Have chest pain.  Have fainting spells.  Have not felt your baby move for the time period told by your health care provider.  Have new or increased pain, swelling, or redness in an arm or leg. Summary  The second trimester of pregnancy is from week 13 through week 27 (months 4 through 6).  Do not use herbal remedies, alcohol, illegal drugs, or medicines that are not approved by your health care provider. Chemicals in these  products can harm your baby.  Exercise only as directed by your health care provider. Most people can continue their usual exercise routine during pregnancy.  Keep all follow-up visits. This is important. This information is not intended to replace advice given to you by your health care provider. Make sure you discuss any questions you have with your health care provider. Document Revised: 04/30/2020 Document Reviewed: 03/06/2020 Elsevier Patient Education  2021 Elsevier Inc.   Type 1 or Type 2 Diabetes Mellitus During Pregnancy, Self Care When you have type 1 or type 2 diabetes (diabetes mellitus), you must keep your blood sugar (glucose) in a healthy range. You can do this with:  Healthy eating.  Exercise.  Lifestyle changes.  Insulin or medicines, if needed.  Support from your doctors and others. You can manage your diabetes at home while you are pregnant. What are the risks? If diabetes  is treated, it is not likely to cause problems. If diabetes is not treated:  It may cause problems during labor and delivery. Some of those problems can hurt the unborn baby and the mother.  It may also cause a newborn to have breathing problems and low blood sugar. Having diabetes can put you at risk for other problems. These include heart disease and kidney disease. Your doctor may prescribe medicines to help you avoid getting these problems. How to stay aware of blood sugar Be sure to:  Check your blood sugar every day, as often as told.  Call your doctor if your blood sugar is above your goal numbers for 2 tests in a row.  Have your HbA1C level checked as often as told. Your doctor will set treatment goals for you. In general, you should have these blood sugar levels:  After not eating for 8 hours (after fasting): at or below 95 mg/dL (5.3 mmol/L).  After meals: ? One hour after a meal: at or below 140 mg/dL (7.8 mmol/L). ? Two hours after a meal: at or below 120 mg/dL (6.7 mmol/L).  HbA1C level: less than 6%.   Follow these instructions at home: Medicines  Take over-the-counter and prescription medicines only as told by your doctor.  If your doctor prescribed insulin or diabetes medicines: ? Take them as told. ? Take them every day.  Do not run out of insulin or medicines. Plan ahead to make sure you always have some.  If you use insulin, change how much you take based on how much exercise you get and what foods you eat. Your doctor will tell you how to do this.  Your doctor may tell you to take one low-dose aspirin (81 mg) each day to help prevent high blood pressure while you are pregnant. You may be at risk if: ? Any of these happened while you were pregnant in the past:  You had high blood pressure.  Your unborn baby grew slower than normal.  You had an early birth.  Your placenta separated from your womb (uterus).  The baby died in the womb. ? You are  pregnant with more than one baby. ? You have high blood pressure or other medical problems. Eating and drinking  Eat healthy foods to control diabetes. These include: ? Chicken, fish, egg whites, and beans. ? Oats, whole wheat, bulgur, brown rice, quinoa, and millet. ? Fresh fruits and vegetables. ? Low-fat dairy products. ? Nuts, avocado, olive oil, and canola oil.  Meet with a food specialist (dietitian). He or she can help you make an eating plan  that is right for you.  Follow instructions from your doctor about what you cannot eat or drink.  Drink enough fluid to keep your pee (urine) pale yellow.  Eat healthy snacks between healthy meals.  Keep track of the carbs you eat. Do this by reading food labels and learning food serving sizes.  Follow your sick-day plan when you cannot eat or drink normally. Make this plan with your doctor so it is ready to use.   Activity  Exercise for 30 minutes or more a day or as much as told by your doctor.  Talk with your doctor before you start a new exercise or activity. Your doctor may need to tell you to change: ? How much insulin or medicines you take. ? How much food you eat. Lifestyle  Do not drink alcohol.  Do not smoke or use any products that contain nicotine or tobacco. If you need help quitting, ask your doctor.  Learn how to deal with stress. If you need help with this, ask your doctor. Care for your body  Stay up to date with your shots (immunizations).  Get an eye exam during the first 3 months that you are pregnant, or as told.  Check your skin and feet every day. Check for cuts, bruises, redness, blisters, or sores.  Get a foot exam once a year.  Brush your teeth and gums two times a day. Floss one or more times a day.  Go to the dentist one or more times every 6 months.  Stay at a healthy weight while you are pregnant. General instructions  Talk with your doctor about your risk for high blood pressure during  pregnancy.  Share your diabetes care plan with: ? Your work or school. ? People you live with.  Check your pee for ketones when you are sick and as told.  Wear an alert bracelet or carry a card that says that you have diabetes.  Keep all follow-up visits. Questions to ask your doctor  Do I need to meet with a diabetes educator?  Where can I find a support group for people with diabetes? Where to find more information  American Diabetes Association: diabetes.org  Association of Diabetes Care & Education Specialists: diabeteseducator.org  International Diabetes Federation: http://hill.biz/idf.org Contact a doctor if:  Your blood sugar is at or above 240 mg/dL (16.113.3 mmol/L).  You have been sick or have had a fever for 2 days or more and you are not getting better.  You have any of these problems for more than 6 hours: ? You cannot eat or drink. ? You feel like you may vomit and you vomit. ? You have watery poop. Get help right away if:  You have very low blood sugar (severe hypoglycemia). This means your blood sugar is below 54 mg/dL (3 mmol/L).  You get mixed up (confused).  You have trouble thinking or breathing.  Your baby moves less than normal.  You get blood or fluid coming from your vagina.  Your contractions start early. These feel like tightening in your lower belly. These symptoms may be an emergency. Get help right away. Call your local emergency services (911 in the [place]  Do not wait to see if the symptoms will go away.  Do not drive yourself to the hospital. Summary  When you have type 1 or type 2 diabetes, you must keep your blood sugar (glucose) in a healthy range. You can do that with insulin and other medicines, healthy eating, exercise,  and lifestyle changes.  Check your blood sugar every day, as often as told.  Take insulin or diabetes medicines each day, if your doctor prescribed them.  Keep all follow-up visits. This information is not intended to  replace advice given to you by your health care provider. Make sure you discuss any questions you have with your health care provider. Document Revised: 06/18/2020 Document Reviewed: 06/18/2020 Elsevier Patient Education  2021 [employer].

## 2021-04-10 ENCOUNTER — Other Ambulatory Visit: Payer: Self-pay | Admitting: Internal Medicine

## 2021-04-10 LAB — RPR+RH+ABO+RUB AB+AB SCR+CB...
Antibody Screen: NEGATIVE
HIV Screen 4th Generation wRfx: NONREACTIVE
Hematocrit: 35.5 % (ref 34.0–46.6)
Hemoglobin: 11.1 g/dL (ref 11.1–15.9)
Hepatitis B Surface Ag: NEGATIVE
MCH: 22.8 pg — ABNORMAL LOW (ref 26.6–33.0)
MCHC: 31.3 g/dL — ABNORMAL LOW (ref 31.5–35.7)
MCV: 73 fL — ABNORMAL LOW (ref 79–97)
Platelets: 406 10*3/uL (ref 150–450)
RBC: 4.87 x10E6/uL (ref 3.77–5.28)
RDW: 16.7 % — ABNORMAL HIGH (ref 11.7–15.4)
RPR Ser Ql: NONREACTIVE
Rh Factor: POSITIVE
Rubella Antibodies, IGG: 1.03 index (ref >0.99)
Varicella zoster IgG: 265 index (ref >165)
WBC: 12.1 10*3/uL — ABNORMAL HIGH (ref 3.4–10.8)

## 2021-04-10 LAB — HEPATITIS C ANTIBODY: Hep C Virus Ab: <0.1 s/co ratio (ref 0.0–0.9)

## 2021-04-14 LAB — CYTOLOGY - PAP
Chlamydia: NEGATIVE
Comment: NEGATIVE
Comment: NEGATIVE
Comment: NORMAL
Diagnosis: NEGATIVE
Neisseria Gonorrhea: NEGATIVE
Trichomonas: NEGATIVE

## 2021-04-15 ENCOUNTER — Encounter: Payer: Self-pay | Admitting: Obstetrics & Gynecology

## 2021-04-15 ENCOUNTER — Other Ambulatory Visit: Payer: Self-pay

## 2021-04-15 ENCOUNTER — Ambulatory Visit (INDEPENDENT_AMBULATORY_CARE_PROVIDER_SITE_OTHER): Payer: Medicaid Other | Admitting: Obstetrics & Gynecology

## 2021-04-15 VITALS — BP 120/70 | Wt 302.0 lb

## 2021-04-15 DIAGNOSIS — Z3A01 Less than 8 weeks gestation of pregnancy: Secondary | ICD-10-CM

## 2021-04-15 DIAGNOSIS — O24112 Pre-existing diabetes mellitus, type 2, in pregnancy, second trimester: Secondary | ICD-10-CM

## 2021-04-15 DIAGNOSIS — O24119 Pre-existing diabetes mellitus, type 2, in pregnancy, unspecified trimester: Secondary | ICD-10-CM | POA: Insufficient documentation

## 2021-04-15 DIAGNOSIS — O24111 Pre-existing diabetes mellitus, type 2, in pregnancy, first trimester: Secondary | ICD-10-CM

## 2021-04-15 DIAGNOSIS — Z3A21 21 weeks gestation of pregnancy: Secondary | ICD-10-CM

## 2021-04-15 DIAGNOSIS — O09892 Supervision of other high risk pregnancies, second trimester: Secondary | ICD-10-CM

## 2021-04-15 DIAGNOSIS — O99212 Obesity complicating pregnancy, second trimester: Secondary | ICD-10-CM | POA: Insufficient documentation

## 2021-04-15 DIAGNOSIS — O99211 Obesity complicating pregnancy, first trimester: Secondary | ICD-10-CM

## 2021-04-15 LAB — POCT URINALYSIS DIPSTICK OB
Glucose, UA: NEGATIVE
POC,PROTEIN,UA: NEGATIVE

## 2021-04-15 MED ORDER — METFORMIN HCL 1000 MG PO TABS: 1000.0000 mg | ORAL_TABLET | Freq: Two times a day (BID) | ORAL | 11 refills | Status: DC

## 2021-04-15 NOTE — Progress Notes (Signed)
Prenatal Visit Note Date: 04/15/2021 Clinic: [facility]  Subjective:  Robin Houston is a 25 y.o. G3P0020 at [redacted]w[redacted]d being seen today for ongoing prenatal care.  She is currently monitored for the following issues for this high-risk pregnancy and has Symptomatic anemia; Supervision of other high risk pregnancies, second trimester; Obesity affecting pregnancy in first trimester; and Pregnancy with type 2 diabetes mellitus in first trimester on their problem list.  Patient reports no complaints and denies nausea or breast T.    . Vag. Bleeding: None.  Movement: Absent. Denies leaking of fluid.   The following portions of the patient's history were reviewed and updated as appropriate: allergies, current medications, past family history, past medical history, past social history, past surgical history and problem list. Problem list updated.  Objective:   Vitals:   04/15/21 1034  BP: 120/70  Weight: (!) 302 lb (137 kg)    Fetal Status:     Movement: Absent     General:  Alert, oriented and cooperative. Patient is in no acute distress.  Skin: Skin is warm and dry. No rash noted.   Cardiovascular: Normal heart rate noted  Respiratory: Normal respiratory effort, no problems with respiration noted  Abdomen: Soft, gravid, appropriate for gestational age. Pain/Pressure: Absent     Pelvic:  Cervical exam deferred        Extremities: Normal range of motion.     Mental Status: Normal mood and affect. Normal behavior. Normal judgment and thought content.   Urinalysis:      Assessment and Plan:  Pregnancy: G3P0020 at [redacted]w[redacted]d  1. Not [redacted] weeks gestation of pregnancy - CHANGE EDC based on [place] last week to 11/26/21 - POC Urinalysis Dipstick OB  2. [redacted] weeks gestation of pregnancy PNV Plans NIPT 10 + weeks  3. Supervision of other high risk pregnancies, second trimester MFM 05/08/21 scheduled  4. Pregnancy with type 2 diabetes mellitus in first trimester BS log still high, and she is only checking  fastings She is taking Metformin once a day Increase to twice a day Metformin, likely will need Insulin Log given and educated.  Recheck BS log in 1-2 weeks.  Check 4 x daily.  Ranges discussed that are acceptable.  5. Obesity affecting pregnancy in first trimester Discussed risk factors   Pregnancy3 Problems (from 04/08/21 to present)    Problem Noted Resolved   Obesity affecting pregnancy in first trimester 04/15/2021 by Nadara Mustard, MD No   Overview Signed 04/15/2021 11:16 AM by Nadara Mustard, MD    BMI >=40 [ ]  screen sleep apnea [ ]  anesthesia consult (early and late if BMI > 45) [x ] u/s for dating [x ]  [x ] nutritional goals [x ] folic acid 1mg  [ ]  bASA (>12 weeks) [ ]  consider nutrition consult [ ]  consider maternal EKG 1st trimester [ ]  Growth u/s 28 [ ] , 32 [ ] , 36 weeks [ ]  [ ]  NST/AFI weekly 34+ weeks (34[] ,35[] ,36[] , 37[] , 38[] , 39[] , 40[] ) [ ]  IOL by 41 weeks (scheduled, prn [] )       Pregnancy with type 2 diabetes mellitus in first trimester 04/15/2021 by , MD No   Overview Signed 04/15/2021 11:15 AM by , MD    Current Diabetic Medications:  Metformin  [ ]  Aspirin 81 mg daily after 12 weeks; discontinue after 36 weeks (? A2/B GDM)  For A2/B GDM or higher classes of DM [ ]  Diabetes Education and Testing Supplies [ ]  Nutrition Counsult [ ]   Fetal ECHO after 22-24 weeks  [ ]  Eye exam for retina evaluation  [ ]  Baseline EKG [ ]  fetal growth every 4 weeks starting at 28 weeks [ ]  Twice weekly NST starting at [redacted] weeks gestation [ ]  Delivery planning contingent on fetal growth, AFI, glycemic control, and other co-morbidities but at least by 39 weeks  Baseline and surveillance labs (pulled in from [facility], refresh links as needed)  Lab Results  Component Value Date   CREATININE 0.70 07/24/2020   AST 15 03/31/2020   ALT 12 03/31/2020   Lab Results  Component Value Date   HGBA1C 6.6 (H) 03/31/2020    Antenatal  Testing Class of DM U/S NST/AFI DELIVERY  Diabetes   A1 - good control - O24.410    A2 - good control - O24.419      A2  - poor control or poor compliance - O24.419, E11.65   (Macrosomia or polyhydramnios) **E11.65 is extra code for poor control**    A2/B - O24.919  and B-C O24.319  Poor control B-C or D-R-F-T - O24.319  or  Type I DM - O24.019  20-38  20-38  20-24-28-32-36   20-24-28-32-35-38//fetal echo  20-24-27-30-33-36-38//fetal echo  40  32//2 x wk  32//2 x wk   32//2 x wk  28//BPP wkly then 32//2 x wk  40  39  PRN   39  PRN          Supervision of other high risk pregnancies, second trimester 04/08/2021 by 09-[birth year]-1971, MD No   Overview Addendum 04/15/2021 11:15 AM by 11-26-1983, MD     Nursing Staff Provider  Office Location  [facility] Dating  03-05-2005 at 6 wks  Language  English Anatomy 06/08/2021  ECHO:  Flu Vaccine   Genetic Screen  NIPS:   TDaP vaccine    Hgb A1C or  GTT GDMB   Covid vaccinated   LAB RESULTS   Rhogam   Blood Type A/Positive/-- (05/04 1445)   Feeding Plan  Antibody Negative (05/04 1445)  Contraception  Rubella 1.03 (05/04 1445)  Circumcision  RPR Non Reactive (05/04 1445)   Pediatrician   HBsAg Negative (05/04 1445)   Support Person  HIV Non Reactive (05/04 1445)  Prenatal Classes  Varicella     GBS  (For PCN allergy, check sensitivities)   BTL Consent     VBAC Consent n/a Pap  04/08/2021    Hgb Electro      CF      SMA         Obesity in pregnancy: pregravid BMI: 49 Type 2 diabetes in pregnancy       Return in 16 days (on 05/01/2021) for HROB and labs (NIPT).  04-28-2002, MD, 04-28-2002 Ob/Gyn, [facility] 04/15/2021  11:16 AM

## 2021-04-15 NOTE — Patient Instructions (Signed)
Daily Diabetes Mellitus Record  Check your blood glucose (BG) as told by your health care provider. Use this form to record your BG results as well as any diabetes medicines that you take, including insulin. A record of your BG results and a list of your current medicines are very helpful in managing your diabetes. Your BG numbers help your health care provider know whether your diabetes management plan needs to be changed. Daily BG results and diabetes medicines (METFORMIN TWICE a DAY)  Date: _________  Fasting - ___________   Breakfast - BG / Medicines: ________________ / __________________________________________________________  Lunch - BG / Medicines: ___________________ / __________________________________________________________  Dinner - BG / Medicines: __________________ / __________________________________________________________ Date: _________   FASTING - ____________   Breakfast - BG / Medicines: ________________ / __________________________________________________________  Lunch - BG / Medicines: ___________________ / __________________________________________________________  Dinner - BG / Medicines: __________________ / __________________________________________________________ Date: _________  FASTING - _____________________________   Breakfast - BG / Medicines: ________________ / __________________________________________________________  Lunch - BG / Medicines: ___________________ / __________________________________________________________  Dinner - BG / Medicines: __________________ / __________________________________________________________  Date: _________  FASTING - _____________________________   Breakfast - BG / Medicines: ________________ / __________________________________________________________  Lunch - BG / Medicines: ___________________ / __________________________________________________________  Dinner - BG / Medicines:  __________________ / __________________________________________________________  Date: _________  FASTING - _________________________   Breakfast - BG / Medicines: ________________ / __________________________________________________________  Lunch - BG / Medicines: ___________________ / __________________________________________________________  Dinner - BG / Medicines: __________________ / __________________________________________________________  Date: _________  FASTING - ___________________________   Breakfast - BG / Medicines: ________________ / __________________________________________________________  Lunch - BG / Medicines: ___________________ / __________________________________________________________  Dinner - BG / Medicines: __________________ / __________________________________________________________  Date: _________  FASTING - _____________________________   Breakfast - BG / Medicines: ________________ / __________________________________________________________  Lunch - BG / Medicines: ___________________ / __________________________________________________________  Dinner - BG / Medicines: __________________ / __________________________________________________________  Notes: ______________________________________________________________________________________________________________________

## 2021-05-01 ENCOUNTER — Ambulatory Visit (INDEPENDENT_AMBULATORY_CARE_PROVIDER_SITE_OTHER): Payer: Medicaid Other | Admitting: Obstetrics & Gynecology

## 2021-05-01 ENCOUNTER — Encounter: Payer: Self-pay | Admitting: Obstetrics & Gynecology

## 2021-05-01 ENCOUNTER — Other Ambulatory Visit: Payer: Self-pay

## 2021-05-01 ENCOUNTER — Other Ambulatory Visit: Payer: Self-pay | Admitting: Obstetrics & Gynecology

## 2021-05-01 VITALS — BP 120/70 | Wt 311.0 lb

## 2021-05-01 DIAGNOSIS — Z1379 Encounter for other screening for genetic and chromosomal anomalies: Secondary | ICD-10-CM

## 2021-05-01 DIAGNOSIS — Z3A10 10 weeks gestation of pregnancy: Secondary | ICD-10-CM

## 2021-05-01 DIAGNOSIS — O09892 Supervision of other high risk pregnancies, second trimester: Secondary | ICD-10-CM

## 2021-05-01 DIAGNOSIS — O99211 Obesity complicating pregnancy, first trimester: Secondary | ICD-10-CM

## 2021-05-01 DIAGNOSIS — O24111 Pre-existing diabetes mellitus, type 2, in pregnancy, first trimester: Secondary | ICD-10-CM

## 2021-05-01 LAB — POCT URINALYSIS DIPSTICK OB
Glucose, UA: NEGATIVE
POC,PROTEIN,UA: NEGATIVE

## 2021-05-01 MED ORDER — ACCU-CHEK NANO SMARTVIEW W/DEVICE KIT
1.0000 | PACK | 0 refills | Status: DC
Start: 2021-05-01 — End: 2021-05-01

## 2021-05-01 MED ORDER — ACCU-CHEK SOFTCLIX LANCETS MISC: 1.0000 | Freq: Four times a day (QID) | 12 refills | Status: DC

## 2021-05-01 MED ORDER — GLUCOSE BLOOD VI STRP: ORAL_STRIP | 12 refills | Status: DC

## 2021-05-01 NOTE — Patient Instructions (Signed)
Type 1 or Type 2 Diabetes Mellitus During Pregnancy, Self Care When you have type 1 or type 2 diabetes (diabetes mellitus), you must keep your blood sugar (glucose) in a healthy range. You can do this with:  Healthy eating.  Exercise.  Lifestyle changes.  Insulin or medicines, if needed.  Support from your doctors and others. You can manage your diabetes at home while you are pregnant. What are the risks? If diabetes is treated, it is not likely to cause problems. If diabetes is not treated:  It may cause problems during labor and delivery. Some of those problems can hurt the unborn baby and the mother.  It may also cause a newborn to have breathing problems and low blood sugar. Having diabetes can put you at risk for other problems. These include heart disease and kidney disease. Your doctor may prescribe medicines to help you avoid getting these problems. How to stay aware of blood sugar Be sure to:  Check your blood sugar every day, as often as told.  Call your doctor if your blood sugar is above your goal numbers for 2 tests in a row.  Have your HbA1C level checked as often as told. Your doctor will set treatment goals for you. In general, you should have these blood sugar levels:  After not eating for 8 hours (after fasting): at or below 95 mg/dL (5.3 mmol/L).  After meals: ? One hour after a meal: at or below 140 mg/dL (7.8 mmol/L). ? Two hours after a meal: at or below 120 mg/dL (6.7 mmol/L).  HbA1C level: less than 6%.   Follow these instructions at home: Medicines  Take over-the-counter and prescription medicines only as told by your doctor.  If your doctor prescribed insulin or diabetes medicines: ? Take them as told. ? Take them every day.  Do not run out of insulin or medicines. Plan ahead to make sure you always have some.  If you use insulin, change how much you take based on how much exercise you get and what foods you eat. Your doctor will tell you how  to do this.  Your doctor may tell you to take one low-dose aspirin (81 mg) each day to help prevent high blood pressure while you are pregnant. You may be at risk if: ? Any of these happened while you were pregnant in the past:  You had high blood pressure.  Your unborn baby grew slower than normal.  You had an early birth.  Your placenta separated from your womb (uterus).  The baby died in the womb. ? You are pregnant with more than one baby. ? You have high blood pressure or other medical problems. Eating and drinking  Eat healthy foods to control diabetes. These include: ? Chicken, fish, egg whites, and beans. ? Oats, whole wheat, bulgur, brown rice, quinoa, and millet. ? Fresh fruits and vegetables. ? Low-fat dairy products. ? Nuts, avocado, olive oil, and canola oil.  Meet with a food specialist (dietitian). He or she can help you make an eating plan that is right for you.  Follow instructions from your doctor about what you cannot eat or drink.  Drink enough fluid to keep your pee (urine) pale yellow.  Eat healthy snacks between healthy meals.  Keep track of the carbs you eat. Do this by reading food labels and learning food serving sizes.  Follow your sick-day plan when you cannot eat or drink normally. Make this plan with your doctor so it is ready to   use.   Activity  Exercise for 30 minutes or more a day or as much as told by your doctor.  Talk with your doctor before you start a new exercise or activity. Your doctor may need to tell you to change: ? How much insulin or medicines you take. ? How much food you eat. Lifestyle  Do not drink alcohol.  Do not smoke or use any products that contain nicotine or tobacco. If you need help quitting, ask your doctor.  Learn how to deal with stress. If you need help with this, ask your doctor. Care for your body  Stay up to date with your shots (immunizations).  Get an eye exam during the first 3 months that you are  pregnant, or as told.  Check your skin and feet every day. Check for cuts, bruises, redness, blisters, or sores.  Get a foot exam once a year.  Brush your teeth and gums two times a day. Floss one or more times a day.  Go to the dentist one or more times every 6 months.  Stay at a healthy weight while you are pregnant. General instructions  Talk with your doctor about your risk for high blood pressure during pregnancy.  Share your diabetes care plan with: ? Your work or school. ? People you live with.  Check your pee for ketones when you are sick and as told.  Wear an alert bracelet or carry a card that says that you have diabetes.  Keep all follow-up visits. Questions to ask your doctor  Do I need to meet with a diabetes educator?  Where can I find a support group for people with diabetes? Where to find more information  American Diabetes Association: diabetes.org  Association of Diabetes Care & Education Specialists: diabeteseducator.org  International Diabetes Federation: idf.org Contact a doctor if:  Your blood sugar is at or above 240 mg/dL (13.3 mmol/L).  You have been sick or have had a fever for 2 days or more and you are not getting better.  You have any of these problems for more than 6 hours: ? You cannot eat or drink. ? You feel like you may vomit and you vomit. ? You have watery poop. Get help right away if:  You have very low blood sugar (severe hypoglycemia). This means your blood sugar is below 54 mg/dL (3 mmol/L).  You get mixed up (confused).  You have trouble thinking or breathing.  Your baby moves less than normal.  You get blood or fluid coming from your vagina.  Your contractions start early. These feel like tightening in your lower belly. These symptoms may be an emergency. Get help right away. Call your local emergency services (911 in the [place]  Do not wait to see if the symptoms will go away.  Do not drive yourself to the  hospital. Summary  When you have type 1 or type 2 diabetes, you must keep your blood sugar (glucose) in a healthy range. You can do that with insulin and other medicines, healthy eating, exercise, and lifestyle changes.  Check your blood sugar every day, as often as told.  Take insulin or diabetes medicines each day, if your doctor prescribed them.  Keep all follow-up visits. This information is not intended to replace advice given to you by your health care provider. Make sure you discuss any questions you have with your health care provider. Document Revised: 06/18/2020 Document Reviewed: 06/18/2020 Elsevier Patient Education  2021 Elsevier Inc.  

## 2021-05-01 NOTE — Progress Notes (Signed)
Prenatal Visit Note Date: 05/01/2021 Clinic: [facility]  Subjective:  Robin Houston is a 25 y.o. G3P0020 at [redacted]w[redacted]d being seen today for ongoing prenatal care.  She is currently monitored for the following issues for this high-risk pregnancy and has Symptomatic anemia; Supervision of other high risk pregnancies, second trimester; Obesity affecting pregnancy in first trimester; and Pregnancy with type 2 diabetes mellitus in first trimester on their problem list.  Patient reports no complaints.    . Vag. Bleeding: None.  Movement: Absent. Denies leaking of fluid.  BS LOG- poorly controlled BS Taking Metformin BID  The following portions of the patient's history were reviewed and updated as appropriate: allergies, current medications, past family history, past medical history, past social history, past surgical history and problem list. Problem list updated.  Objective:   Vitals:   05/01/21 1019  BP: 120/70  Weight: (!) 311 lb (141.1 kg)    Fetal Status:     Movement: Absent     General:  Alert, oriented and cooperative. Patient is in no acute distress.  Skin: Skin is warm and dry. No rash noted.   Cardiovascular: Normal heart rate noted  Respiratory: Normal respiratory effort, no problems with respiration noted  Abdomen: Soft, gravid, appropriate for gestational age. Pain/Pressure: Absent     Pelvic:  Cervical exam deferred        Extremities: Normal range of motion.     Mental Status: Normal mood and affect. Normal behavior. Normal judgment and thought content.   Assessment and Plan:  Pregnancy: G3P0020 at [redacted]w[redacted]d  1. Supervision of other high risk pregnancies, second trimester Obesity, Diabetes  2. [redacted] weeks gestation of pregnancy PNV  3. Pregnancy with type 2 diabetes mellitus in first trimester MFM next week w [place], will need to start Insulin (will let them decide/plan dose and/or need for inpatient mgt)  4. Obesity affecting pregnancy in first trimester Baby ASA after 12  weeks Anes consult needed  5. Encounter for genetic screening for Down Syndrome NIPT today - MaterniT21 PLUS Core+SCA  First trimester symptoms and general obstetric precautions including but not limited to vaginal bleeding, contractions, leaking of fluid and fetal movement were reviewed in detail with the patient. Please refer to After Visit Summary for other counseling recommendations.    Return in about 2 weeks (around 05/15/2021) for HROB.  [name], MD, [name], [facility] 05/01/2021  10:49 AM

## 2021-05-01 NOTE — Addendum Note (Signed)
Addended by: Cornelius Moras D on: 05/01/2021 11:00 AM   Modules accepted: Orders

## 2021-05-07 ENCOUNTER — Other Ambulatory Visit: Payer: Self-pay | Admitting: Obstetrics and Gynecology

## 2021-05-07 DIAGNOSIS — O09892 Supervision of other high risk pregnancies, second trimester: Secondary | ICD-10-CM

## 2021-05-08 ENCOUNTER — Other Ambulatory Visit: Payer: Self-pay

## 2021-05-08 ENCOUNTER — Ambulatory Visit: Payer: Medicaid Other | Attending: Obstetrics and Gynecology

## 2021-05-08 ENCOUNTER — Encounter: Payer: Self-pay | Admitting: *Deleted

## 2021-05-08 ENCOUNTER — Ambulatory Visit (HOSPITAL_BASED_OUTPATIENT_CLINIC_OR_DEPARTMENT_OTHER): Payer: Medicaid Other | Admitting: Obstetrics

## 2021-05-08 ENCOUNTER — Ambulatory Visit: Payer: Medicaid Other | Admitting: *Deleted

## 2021-05-08 DIAGNOSIS — O99211 Obesity complicating pregnancy, first trimester: Secondary | ICD-10-CM | POA: Insufficient documentation

## 2021-05-08 DIAGNOSIS — E669 Obesity, unspecified: Secondary | ICD-10-CM

## 2021-05-08 DIAGNOSIS — Z3A11 11 weeks gestation of pregnancy: Secondary | ICD-10-CM | POA: Diagnosis present

## 2021-05-08 DIAGNOSIS — O24415 Gestational diabetes mellitus in pregnancy, controlled by oral hypoglycemic drugs: Secondary | ICD-10-CM

## 2021-05-08 DIAGNOSIS — O24111 Pre-existing diabetes mellitus, type 2, in pregnancy, first trimester: Secondary | ICD-10-CM | POA: Insufficient documentation

## 2021-05-08 DIAGNOSIS — O09892 Supervision of other high risk pregnancies, second trimester: Secondary | ICD-10-CM

## 2021-05-08 DIAGNOSIS — O09891 Supervision of other high risk pregnancies, first trimester: Secondary | ICD-10-CM

## 2021-05-08 LAB — MATERNIT21 PLUS CORE+SCA
Fetal Fraction: 4
Monosomy X (Turner Syndrome): NOT DETECTED
Result (T21): NEGATIVE
Trisomy 13 (Patau syndrome): NEGATIVE
Trisomy 18 (Edwards syndrome): NEGATIVE
Trisomy 21 (Down syndrome): NEGATIVE
XXX (Triple X Syndrome): NOT DETECTED
XXY (Klinefelter Syndrome): NOT DETECTED
XYY (Jacobs Syndrome): NOT DETECTED

## 2021-05-08 NOTE — Progress Notes (Signed)
MFM Consult  Robin Houston is a gravida 3 para 0 currently at 11 weeks and 4 days.  She was seen in consultation due to pregestational diabetes that is currently treated with metformin 1000 mg twice a day.  The patient has not had a recent hemoglobin A1c drawn.  Her hemoglobin A1c from April 2021 was 6.6%.  She reports that the plan is to switch her from metformin to insulin treatment soon.  As she has only been diagnosed with diabetes about one year ago, she has not experienced any significant sequelae associated with diabetes.  Her kidney function tests are within normal limits.  Her past obstetrical history includes 2 prior first trimester miscarriages.  Other than diabetes, she denies any other significant past medical or surgical history.  Her current medications include metformin, prenatal vitamins and iron pills.  The patient reports that she had a cell free DNA test drawn to screen for fetal aneuploidy last week.  The results of that test are currently pending.  On today's ultrasound exam, a viable singleton pregnancy is noted with a crown-rump length consistent with an [facility] of November 23, 2021.  The following were discussed during today's consultation:  Pregestational diabetes and pregnancy  The implications and management of diabetes in pregnancy was discussed in detail with the patient. She was advised that our goals for her blood glucose levels are fasting values of 90-95 or less and two-hour postprandials of 120 or less.    As the patient reports that she will be switched to insulin treatment soon, she should be placed on a weight based regimen of long-acting and short acting insulin that will help her maintain euglycemic control.  The recommended initial daily insulin dose is 0.8 units/kg of body weight in the first trimester, 1.0 units/kg in the second trimester, and 1.2 units/kg in the third trimester.  The total insulin dosage should be divided into a regimen of short acting and  long-acting insulin.  The insulin dosage should then be adjusted based on her fingerstick values.  The patient was advised that getting her fingerstick values as close to these goals as possible would provide her with the most optimal obstetrical outcome.  The increased risk of fetal macrosomia, preeclampsia, and polyhydramnios due to diabetes in pregnancy was discussed.  She was also advised regarding the increased risk of congenital anomalies and congenital heart defects associated with pregestational diabetes.  The patient was advised that due to diabetes in pregnancy, we will continue to follow her with serial ultrasounds.  A detailed fetal anatomy scan has been scheduled for her in our office at around 19 weeks.  We will also refer her for a fetal echocardiogram with pediatric cardiology at around 22 weeks.  We will then follow her with monthly growth ultrasounds.  Weekly to twice weekly nonstress tests should be started at around 32 weeks and continued until delivery.    The patient was advised that should the fetal growth appear within normal limits and her fingerstick values are mostly in the normal range, that delivery at around 39 weeks is usually recommended.  However, if her glycemic control is poor or if should she have elevated blood pressures, delivery at 37 weeks may be necessary.  As women with diabetes may be at increased risk for developing preeclampsia, she should start taking a daily baby aspirin (81 mg daily) at around 12 weeks.  She should also have a baseline 24-hour urine collected as soon as possible to screen for proteinuria.  At the end of the consultation, both the patient and her partner stated that all their questions have been answered to their complete satisfaction.    Thank you for referring this patient for a Maternal-Fetal Medicine consultation.  Recommendations:  Continue metformin or switch to a long-acting and short acting insulin regimen for glycemic control   Continue to monitor fingerstick values daily  Baseline 24-hour urine screen for total protein to be done as soon as possible Start daily baby aspirin for preeclampsia prophylaxis at around 12 weeks  Detailed fetal anatomy scan at 19 weeks  Fetal echocardiogram at 22 weeks Monthly growth ultrasounds Weekly to twice weekly fetal testing starting at 32 weeks Delivery at between 37 to 39 weeks depending on her blood pressures and glycemic control

## 2021-05-11 ENCOUNTER — Other Ambulatory Visit: Payer: Self-pay | Admitting: *Deleted

## 2021-05-11 DIAGNOSIS — O24111 Pre-existing diabetes mellitus, type 2, in pregnancy, first trimester: Secondary | ICD-10-CM

## 2021-05-14 ENCOUNTER — Ambulatory Visit (INDEPENDENT_AMBULATORY_CARE_PROVIDER_SITE_OTHER): Payer: Medicaid Other | Admitting: Obstetrics & Gynecology

## 2021-05-14 ENCOUNTER — Encounter: Payer: Self-pay | Admitting: Obstetrics & Gynecology

## 2021-05-14 ENCOUNTER — Other Ambulatory Visit: Payer: Self-pay

## 2021-05-14 VITALS — BP 130/80 | Wt 305.0 lb

## 2021-05-14 DIAGNOSIS — O09892 Supervision of other high risk pregnancies, second trimester: Secondary | ICD-10-CM

## 2021-05-14 DIAGNOSIS — Z3A12 12 weeks gestation of pregnancy: Secondary | ICD-10-CM

## 2021-05-14 DIAGNOSIS — O24112 Pre-existing diabetes mellitus, type 2, in pregnancy, second trimester: Secondary | ICD-10-CM

## 2021-05-14 DIAGNOSIS — O99212 Obesity complicating pregnancy, second trimester: Secondary | ICD-10-CM

## 2021-05-14 DIAGNOSIS — Z6841 Body Mass Index (BMI) 40.0 and over, adult: Secondary | ICD-10-CM

## 2021-05-14 NOTE — Patient Instructions (Signed)

## 2021-05-14 NOTE — Progress Notes (Signed)
Prenatal Visit Note Date: 05/14/2021 Clinic: [facility]  Subjective:  Robin Houston is a 25 y.o. G3P0020 at [redacted]w[redacted]d being seen today for ongoing prenatal care.  She is currently monitored for the following issues for this high-risk pregnancy and has Symptomatic anemia; Supervision of other high risk pregnancies, second trimester; Obesity affecting pregnancy in first trimester; and Pregnancy with type 2 diabetes mellitus in first trimester on their problem list.  Patient reports no complaints.   Reports normal BS log (did not bring today, encouraged to do so, or keep on phone) Denies ha, blurry vision, CP, SOB, pain, bleeding, n/v, dizziness.  The following portions of the patient's history were reviewed and updated as appropriate: allergies, current medications, past family history, past medical history, past social history, past surgical history and problem list. Problem list updated.  Objective:   Vitals:   05/14/21 1557  BP: 130/80  Weight: (!) 305 lb (138.3 kg)   General:  Alert, oriented and cooperative. Patient is in no acute distress.  Skin: Skin is warm and dry. No rash noted.   Cardiovascular: Normal heart rate noted  Respiratory: Normal respiratory effort, no problems with respiration noted  Abdomen: Soft, gravid, appropriate for gestational age.       Pelvic:  Cervical exam deferred        Extremities: Normal range of motion.     Mental Status: Normal mood and affect. Normal behavior. Normal judgment and thought content.   Assessment and Plan:  Pregnancy: G3P0020 at [redacted]w[redacted]d  1. Supervision of other high risk pregnancies, second trimester DIscussed plan for pregnancy Has seen MFM, will continue to do so for Anat [place] next (19 weeks Start baby ASA now 24 hour urine for protein ordered and educated on how to collect today As BS log reportedly normal, will cont Metformin    Change to Insulin if prro control encountered  2. [redacted] weeks gestation of pregnancy PNV, Hydration, Activity  restrictions  3. Pregnancy with type 2 diabetes mellitus in second trimester - Protein, urine, 24 hour  4. Obesity affecting pregnancy in second trimester BMI 45.0-49.9, adult [facility]) - Ambulatory referral to Anesthesiology  Pregnancy3 Problems (from 04/08/21 to present)    Problem Noted Resolved   Obesity affecting pregnancy in first trimester 04/15/2021 by Nadara Mustard, MD No   Overview Signed 04/15/2021 11:16 AM by Nadara Mustard, MD    BMI >=40 [ ]  screen sleep apnea [ ]  anesthesia consult (early and late if BMI > 45) [x ] u/s for dating [x ]  [x ] nutritional goals [x ] folic acid 1mg  [x ] bASA (>12 weeks) [ ]  consider nutrition consult [ ]  consider maternal EKG 1st trimester [x ] Growth u/s 28 [ ] , 32 [ ] , 36 weeks [ ]  [x ] NST/AFI weekly 34+ weeks (34[] ,35[] ,36[] , 37[] , 38[] , 39[] , 40[] ) [ ]  IOL by 41 weeks (scheduled, prn [] )        Pregnancy with type 2 diabetes mellitus in first trimester 04/15/2021 by , MD No   Overview Signed 04/15/2021 11:15 AM by , MD    Current Diabetic Medications:  Metformin  [x ] Aspirin 81 mg daily after 12 weeks; discontinue after 36 weeks (? A2/B GDM)  For A2/B GDM or higher classes of DM [x ] Diabetes Education and Testing Supplies [ ]  Nutrition Counsult [x ] Fetal ECHO after 22-24 weeks  [ ]  Eye exam for retina evaluation  [ ]  Baseline EKG [x ] fetal growth every  4 weeks starting at 28 weeks [x ] Twice weekly NST starting at [redacted] weeks gestation [x ] Delivery planning contingent on fetal growth, AFI, glycemic control, and other co-morbidities but at least by 39 weeks  Baseline and surveillance labs (pulled in from [facility], refresh links as needed)  Lab Results  Component Value Date   CREATININE 0.70 07/24/2020   AST 15 03/31/2020   ALT 12 03/31/2020   Lab Results  Component Value Date   HGBA1C 6.6 (H) 03/31/2020    Antenatal Testing Class of DM U/S NST/AFI DELIVERY  Diabetes   A1 - good  control - O24.410    A2 - good control - O24.419      A2  - poor control or poor compliance - O24.419, E11.65   (Macrosomia or polyhydramnios) **E11.65 is extra code for poor control**    A2/B - O24.919  and B-C O24.319  Poor control B-C or D-R-F-T - O24.319  or  Type I DM - O24.019  20-38  20-38  20-24-28-32-36   20-24-28-32-35-38//fetal echo  20-24-27-30-33-36-38//fetal echo  40  32//2 x wk  32//2 x wk   32//2 x wk  28//BPP wkly then 32//2 x wk  40  39  PRN   39  PRN           Supervision of other high risk pregnancies, second trimester 04/08/2021 by Natale Milch, MD No   Overview Addendum 04/15/2021 11:15 AM by Nadara Mustard, MD     Nursing Staff Provider  Office Location  [facility] Dating  [place] at 6 wks  Language  English Anatomy US  ECHO:  Flu Vaccine   Genetic Screen  NIPS: nml XY  TDaP vaccine    Hgb A1C or  GTT GDMB   Covid vaccinated   LAB RESULTS   Rhogam   Blood Type A/Positive/-- (05/04 1445)   Feeding Plan  Antibody Negative (05/04 1445)  Contraception  Rubella 1.03 (05/04 1445)  Circumcision  RPR Non Reactive (05/04 1445)   Pediatrician   HBsAg Negative (05/04 1445)   Support Person  HIV Non Reactive (05/04 1445)  Prenatal Classes  Varicella     GBS  (For PCN allergy, check sensitivities)   BTL Consent     VBAC Consent n/a Pap  04/08/2021    Hgb Electro      CF      SMA         Obesity in pregnancy: pregravid BMI: 49 Type 2 diabetes in pregnancy           Return in about 2 weeks (around 05/28/2021) for HROB (MD).  [name], MD, [name], [facility] 05/14/2021  4:22 PM

## 2021-05-18 ENCOUNTER — Other Ambulatory Visit: Payer: Medicaid Other

## 2021-05-18 ENCOUNTER — Other Ambulatory Visit: Payer: Self-pay

## 2021-05-19 LAB — PROTEIN, URINE, 24 HOUR
Protein, 24H Urine: 57 mg/24 hr (ref 30–150)
Protein, Ur: 5.2 mg/dL

## 2021-05-28 ENCOUNTER — Ambulatory Visit (INDEPENDENT_AMBULATORY_CARE_PROVIDER_SITE_OTHER): Payer: Medicaid Other | Admitting: Obstetrics and Gynecology

## 2021-05-28 ENCOUNTER — Other Ambulatory Visit: Payer: Self-pay

## 2021-05-28 ENCOUNTER — Encounter: Payer: Self-pay | Admitting: Obstetrics and Gynecology

## 2021-05-28 VITALS — BP 120/74 | Ht 65.0 in | Wt 311.2 lb

## 2021-05-28 DIAGNOSIS — Z3A14 14 weeks gestation of pregnancy: Secondary | ICD-10-CM

## 2021-05-28 DIAGNOSIS — O24111 Pre-existing diabetes mellitus, type 2, in pregnancy, first trimester: Secondary | ICD-10-CM

## 2021-05-28 MED ORDER — INSULIN PEN NEEDLE 30G X 8 MM MISC: 1.0000 | Freq: Every day | Status: DC

## 2021-05-28 MED ORDER — INSULIN GLARGINE 100 UNIT/ML SOLOSTAR PEN: 5.0000 [IU] | PEN_INJECTOR | Freq: Every day | SUBCUTANEOUS | 11 refills | Status: DC

## 2021-05-28 NOTE — Patient Instructions (Signed)
Insulin Injection Instructions, Single Insulin Dose, Adult There are many different types of insulin. The type of insulin that you take may determine how many injections you give yourself and when you need to givethe injections. Supplies needed: Soap and water. Insulin medicine in small bottles (vials). A new, unused insulin syringe. Alcohol wipes. A disposal container for sharp items (sharps container), such as an empty plastic bottle with a cover. How to choose a site for injection The body absorbs insulin differently, depending on where the insulin is injected (injection site). It is best to inject insulin into the same body area each time; for example, always in the abdomen. However, you should use a different spot in that area for each injection. Do not inject the insulin in the same spot each time. There are five main areas that can be used for injecting. These areas are: Abdomen. This is the preferred area. Front of thigh. Upper, outer side of thigh. Upper, outer side of arm. Upper, outer part of buttock. How to give a single-dose insulin injection Get ready Wash your hands with soap and water for at least 20 seconds. If soap and water are not available, use hand sanitizer. Test your blood sugar (glucose) level and write down that number. Follow any instructions from your health care provider about what to do if your blood glucose level is higher or lower than your normal range. Make sure the vial you are using has the right kind of insulin and there is enough insulin for your dose. Check the expiration date. Check to make sure you have the correct type of insulin syringe for the concentration of insulin that you are using. Use a new, unused insulin syringe each time you need to inject insulin. If you are using CLEAR insulin, check to see that it is clear and free of clumps. If you are using CLOUDY insulin, mix it by gently rolling the insulin vial between your palms several times. Do  not shake the vial. Remove the plastic pop-top covering from the vial of insulin. This type of covering is present on a vial when it is new. Use an alcohol wipe to clean the rubber top of the vial. Remove the plastic cover from the syringe needle. Do not let the needle touch anything. Push air into the vial To bring (draw up) air into the syringe, slowly pull back on the syringe plunger. Stop pulling the plunger when the dose indicator gets to the number of units that you will be using. While you keep the vial right-side-up, poke the needle through the rubber top of the vial. Do not turn the vial upside down to do this. Push the plunger all the way into the syringe. Doing that will push air into the vial. Do not take the needle out of the vial yet. Fill the syringe  While the needle is still in the vial, turn the vial upside down and hold it at eye level. Slowly pull back on the plunger. Stop pulling the plunger when the dose indicator gets to the desired number of units. If you see air bubbles in the syringe, slowly move the plunger up and down 2 or 3 times to make them go away. If you had to move the plunger to get rid of air bubbles, pull back the plunger until the dose indicator returns to the correct dose. Remove the needle from the vial. Do not let the needle touch anything.  Inject the insulin  Use an alcohol wipe to  clean the site where you will be inserting the needle. Let the site air-dry. Hold the syringe in your writing hand like a pencil. If directed by your health care provider, use your other hand to pinch and hold about an inch (2.5 cm) of skin at the injection site. Do not directly touch the cleaned part of the skin. Gently but quickly, put the needle straight into the skin. The needle should be at a 45-degree angle or a 90-degree angle (perpendicular) to the skin, as told by your health care provider. Push the needle in as far as it will go (to the hub). When the needle is  completely inserted into the skin, let go of the skin that you are pinching. Continue to hold the syringe in place with your writing hand. Use your thumb or index finger of your writing hand to push the plunger all the way into the syringe to inject the insulin. Wait 5-10 seconds, then pull the needle straight out of the skin. This will allow all of the insulin to go from the syringe and needle into your body. If bleeding occurs, press and hold gauze over the injection site until any bleeding stops. Do not rub the area. Do not put the plastic cover back on the needle. Discard the syringe and needle directly into a sharps container.  How to throw away supplies Discard all used needles in a sharps container. Follow the disposal regulations for the area where you live. Do not use any syringe or needle more than one time. Throw away empty vials in the regular trash. Questions to ask your health care provider How often should I be taking insulin? How often should I check my blood glucose? What amount of insulin should I be taking at each time? What are the side effects? What should I do if my blood glucose is too high? What should I do if my blood glucose is too low? What should I do if I forget to take my insulin? What number should I call if I have questions? Where to find more information American Diabetes Association (ADA): diabetes.org Association of Diabetes Care and Education Specialists (ADCES): diabeteseducator.org Summary Before you give yourself an insulin injection, be sure to wash your hands for at least 20 seconds and test your blood glucose level. Write down that number. Check the expiration date and the type of insulin that you are using. The type of insulin that you take may determine how many injections you give yourself and when you need to give the injections. It is best to inject insulin into the same body area each time; for example, always in the abdomen. However, you  should use a different spot in that area for each injection. Do not use an insulin syringe more than one time. This information is not intended to replace advice given to you by your health care provider. Make sure you discuss any questions you have with your healthcare provider. Document Revised: 09/22/2020 Document Reviewed: 09/20/2020 Elsevier Patient Education  2022 [employer] Inc. Insulin Injection Instructions, Using Insulin Pens, Adult There are many different types of insulin. The type of insulin that you take may determine how many injections you give yourself and when you need to givethe injections. Supplies needed: Soap and water. Your insulin pen. A new needle. Alcohol wipes. A disposal container for sharp items (sharps container), such as an empty plastic bottle with a cover. How to choose a site for injection The body absorbs insulin differently, depending on  where the insulin is injected (injection site). It is best to inject insulin into the same body area each time; for example, always in the abdomen. However, you should use a different spot in that area for each injection. Do not inject the insulin in the same spot each time. There are five main areas that can be used for injecting. These areas are: Abdomen. This is the preferred area. Front of thigh. Upper, outer side of thigh. Upper, outer side of arm. Upper, outer part of buttock. How to use an insulin pen  Get ready Wash your hands with soap and water for at least 20 seconds. If soap and water are not available, use hand sanitizer. Test your blood sugar (glucose) level and write down that number. Follow any instructions from your health care provider about what to do if your blood glucose level is higher or lower than your normal range. Make sure the insulin pen has the right kind of insulin and there is enough insulin in the pen for the dose. Check the expiration date. If you are using CLEAR insulin, check to see that  it is clear and free of clumps. If you are using CLOUDY insulin, mix it by gently rolling the insulin pen between your palms several times. Do not shake the pen. Remove the cap from the insulin pen. Use an alcohol wipe to clean the rubber tip of the pen. Remove the protective paper tab from the disposable needle. Do not let the needle touch anything. Screw a new, unused needle onto the pen. Remove the outer plastic needle cover. Do not throw away the outer plastic cover yet. If the pen uses a special safety needle, leave the inner needle shield in place. If the pen does not use a special safety needle, remove the inner plastic cover from the needle. If you skip this step, you may not get the right amount of insulin. Follow the manufacturer's instructions to prime the insulin pen with the volume of insulin needed. Hold the pen with the needle pointing up, and push the button on the opposite end of the pen until a drop of insulin appears at the needle tip. If no insulin appears, repeat this step. Turn the button (dial) to the number of units of insulin that you will be injecting. Inject the insulin Use an alcohol wipe to clean the site where you will be inserting the needle. Let the site air-dry. Grip the base of the pen with a loose fist and rest your thumb on the pen or hold the pen in the palm of your writing hand like a pencil. If directed by your health care provider, use your other hand to pinch and hold about 1 inch (2.5 cm) of skin at the injection site. Do not directly touch the cleaned part of the skin. Gently but quickly, use your writing hand to put the needle straight into the skin. Insert the needle at a 45-degree angle or a 90-degree angle (perpendicular) to the skin, as directed by your health care provider. When the needle is completely inserted into the skin, let go of the skin that you are pinching. Use your thumb or index finger of your writing hand to push the top button of the  pen all the way to inject the insulin. Continue to hold the pen in place with your writing hand. Wait 10 seconds, then pull the needle straight out of the skin. This will allow all of the insulin to go from the pen  and needle into your body. Carefully put the outer plastic cover of the needle back over the needle, then unscrew the capped needle and discard it in a sharps container, such as an empty plastic bottle with a cover. Put the plastic cap back on the insulin pen. How to throw away supplies Discard all used needles in a sharps container. Follow the disposal regulations for the area where you live. Do not use any needle more than one time. Throw away empty disposable pens in the regular trash. Questions to ask your health care provider How often should I be taking insulin? How often should I check my blood glucose? What amount of insulin should I be taking each time? What are the side effects? What should I do if my blood glucose is too high? What should I do if my blood glucose is too low? What should I do if I forget to take my insulin? What number should I call if I have questions? Where to find more information American Diabetes Association (ADA): www.diabetes.org Association of Diabetes Care and Education Specialists (ADCES): www.diabeteseducator.org Summary Before you give yourself an insulin injection, be sure to wash your hands for at least 20 seconds and test your blood glucose level. Write down that number. Check the expiration date and the type of insulin that is in the pen. The type of insulin that you take may determine how many injections you give yourself and when you need to give the injections. It is best to inject insulin into the same body area each time; for example, always in the abdomen. However, you should use a different spot in that area for each injection. Do not use a needle more than one time. This information is not intended to replace advice given to you  by your health care provider. Make sure you discuss any questions you have with your healthcare provider. Document Revised: 09/22/2020 Document Reviewed: 09/20/2020 Elsevier Patient Education  2022 [employer]

## 2021-05-28 NOTE — Progress Notes (Signed)
Routine Prenatal Care Visit  Subjective  Robin Houston is a 25 y.o. G3P0020 at [redacted]w[redacted]d being seen today for ongoing prenatal care.  She is currently monitored for the following issues for this high-risk pregnancy and has Symptomatic anemia; Supervision of other high risk pregnancies, second trimester; Obesity affecting pregnancy in first trimester; and Pregnancy with type 2 diabetes mellitus in first trimester on their problem list.  ----------------------------------------------------------------------------------- Patient reports no complaints.    . Vag. Bleeding: None.  Movement: Present. Denies leaking of fluid.  ----------------------------------------------------------------------------------- The following portions of the patient's history were reviewed and updated as appropriate: allergies, current medications, past family history, past medical history, past social history, past surgical history and problem list. Problem list updated.   Objective  Blood pressure 120/74, height 5\' 5"  (1.651 m), weight (!) 311 lb 3.2 oz (141.2 kg), last menstrual period 11/17/2020. Pregravid weight 295 lb (133.8 kg) Total Weight Gain 16 lb 3.2 oz (7.348 kg) Urinalysis:      Fetal Status:     Movement: Present     General:  Alert, oriented and cooperative. Patient is in no acute distress.  Skin: Skin is warm and dry. No rash noted.   Cardiovascular: Normal heart rate noted  Respiratory: Normal respiratory effort, no problems with respiration noted  Abdomen: Soft, gravid, appropriate for gestational age. Pain/Pressure: Absent     Pelvic:  Cervical exam deferred        Extremities: Normal range of motion.     Mental Status: Normal mood and affect. Normal behavior. Normal judgment and thought content.     Assessment   25 y.o. G3P0020 at [redacted]w[redacted]d by  11/23/2021, by Ultrasound presenting for routine prenatal visit  Plan   Pregnancy3 Problems (from 04/08/21 to present)     Problem Noted Resolved    Obesity affecting pregnancy in first trimester 04/15/2021 by 06/15/2021, MD No   Overview Signed 04/15/2021 11:16 AM by 06/15/2021, MD    BMI >=40 [ ]  screen sleep apnea [ ]  anesthesia consult (early and late if BMI > 45) [x ] u/s for dating [x ]  [x ] nutritional goals [x ] folic acid 1mg  [ ]  bASA (>12 weeks) [ ]  consider nutrition consult [ ]  consider maternal EKG 1st trimester [ ]  Growth u/s 28 [ ] , 32 [ ] , 36 weeks [ ]  [ ]  NST/AFI weekly 34+ weeks (34[] ,35[] ,36[] , 37[] , 38[] , 39[] , 40[] ) [ ]  IOL by 41 weeks (scheduled, prn [] )        Pregnancy with type 2 diabetes mellitus in first trimester 04/15/2021 by , MD No   Overview Signed 04/15/2021 11:15 AM by , MD    Current Diabetic Medications:  Metformin  [ ]  Aspirin 81 mg daily after 12 weeks; discontinue after 36 weeks (? A2/B GDM)  For A2/B GDM or higher classes of DM [ ]  Diabetes Education and Testing Supplies [ ]  Nutrition Counsult [ ]  Fetal ECHO after 22-24 weeks  [ ]  Eye exam for retina evaluation  [ ]  Baseline EKG [ ]  fetal growth every 4 weeks starting at 28 weeks [ ]  Twice weekly NST starting at [redacted] weeks gestation [ ]  Delivery planning contingent on fetal growth, AFI, glycemic control, and other co-morbidities but at least by 39 weeks  Baseline and surveillance labs (pulled in from [facility], refresh links as needed)  Lab Results  Component Value Date   CREATININE 0.70 07/24/2020   AST 15 03/31/2020  ALT 12 03/31/2020   Lab Results  Component Value Date   HGBA1C 6.6 (H) 03/31/2020   Antenatal Testing Class of DM U/S NST/AFI DELIVERY  Diabetes   A1 - good control - O24.410    A2 - good control - O24.419      A2  - poor control or poor compliance - O24.419, E11.65   (Macrosomia or polyhydramnios) **E11.65 is extra code for poor control**    A2/B - O24.919  and B-C O24.319  Poor control B-C or D-R-F-T - O24.319  or  Type I DM - O24.019   20-38  20-38  20-24-28-32-36   20-24-28-32-35-38//fetal echo  20-24-27-30-33-36-38//fetal echo  40  32//2 x wk  32//2 x wk   32//2 x wk  28//BPP wkly then 32//2 x wk  40  39  PRN   39  PRN           Supervision of other high risk pregnancies, second trimester 04/08/2021 by Natale Milch, MD No   Overview Addendum 05/14/2021  4:28 PM by Nadara Mustard, MD     Nursing Staff Provider  Office Location  [facility] Dating  [place] at 6 wks  Language  English Anatomy US  MFM 19 wks [ ] ; ECHO 22 wks [ ]   Flu Vaccine   Genetic Screen  NIPS:nml XY   TDaP vaccine    Hgb A1C or  GTT GDMB   Covid vaccinated   LAB RESULTS   Rhogam  n/a Blood Type A/Positive/-- (05/04 1445)   Feeding Plan  Antibody Negative (05/04 1445)  Contraception  Rubella 1.03 (05/04 1445)  Circumcision  RPR Non Reactive (05/04 1445)   Pediatrician   HBsAg Negative (05/04 1445)   Support Person Husband HIV Non Reactive (05/04 1445)  Prenatal Classes  Varicella Immune    GBS  (For PCN allergy, check sensitivities)   BTL Consent     VBAC Consent n/a Pap  04/08/2021    Hgb Electro         Obesity in pregnancy: pregravid BMI: 49 Type 2 diabetes in pregnancy           Glucose log reviewed- most values with slight elevation. Patient sent the photo of her log thru mychart.  Will start on lantus 5 units at bedtime. Continue metformin BID. Rx sent and instructions of administration reviewed. She was able to start 81 mg ASA and is taking.    Gestational age appropriate obstetric precautions including but not limited to vaginal bleeding, contractions, leaking of fluid and fetal movement were reviewed in detail with the patient.    Return in about 2 weeks (around 06/11/2021) for ROB in person with MD.  06/08/2021 MD [facility], [facility] 05/28/2021, 5:11 PM

## 2021-06-09 ENCOUNTER — Other Ambulatory Visit: Payer: Self-pay | Admitting: Obstetrics and Gynecology

## 2021-06-09 DIAGNOSIS — O24119 Pre-existing diabetes mellitus, type 2, in pregnancy, unspecified trimester: Secondary | ICD-10-CM

## 2021-06-09 MED ORDER — INSULIN PEN NEEDLE 31G X 8 MM MISC: 1.0000 | Freq: Four times a day (QID) | 11 refills | Status: DC

## 2021-06-10 ENCOUNTER — Ambulatory Visit (INDEPENDENT_AMBULATORY_CARE_PROVIDER_SITE_OTHER): Payer: Medicaid Other | Admitting: Obstetrics and Gynecology

## 2021-06-10 ENCOUNTER — Other Ambulatory Visit: Payer: Self-pay

## 2021-06-10 VITALS — BP 128/72 | Ht 65.0 in | Wt 310.6 lb

## 2021-06-10 DIAGNOSIS — O09892 Supervision of other high risk pregnancies, second trimester: Secondary | ICD-10-CM

## 2021-06-10 DIAGNOSIS — O24111 Pre-existing diabetes mellitus, type 2, in pregnancy, first trimester: Secondary | ICD-10-CM

## 2021-06-10 DIAGNOSIS — O99211 Obesity complicating pregnancy, first trimester: Secondary | ICD-10-CM

## 2021-06-10 DIAGNOSIS — Z3A16 16 weeks gestation of pregnancy: Secondary | ICD-10-CM

## 2021-06-10 LAB — POCT URINALYSIS DIPSTICK OB

## 2021-06-10 MED ORDER — INSULIN ASPART 100 UNIT/ML FLEXPEN: 2.0000 [IU] | PEN_INJECTOR | Freq: Three times a day (TID) | SUBCUTANEOUS | 11 refills | Status: DC

## 2021-06-10 MED ORDER — INSULIN GLARGINE 100 UNIT/ML SOLOSTAR PEN: 10.0000 [IU] | PEN_INJECTOR | Freq: Every day | SUBCUTANEOUS | 11 refills | Status: DC

## 2021-06-10 NOTE — Progress Notes (Signed)
Routine Prenatal Care Visit  Subjective  Robin Houston is a 25 y.o. G3P0020 at [redacted]w[redacted]d being seen today for ongoing prenatal care.  She is currently monitored for the following issues for this high-risk pregnancy and has Symptomatic anemia; Supervision of other high risk pregnancies, second trimester; Obesity affecting pregnancy in first trimester; and Pregnancy with type 2 diabetes mellitus in first trimester on their problem list.  ----------------------------------------------------------------------------------- Patient reports no complaints.   Contractions: Not present. Vag. Bleeding: None.  Movement: Present. Denies leaking of fluid.  ----------------------------------------------------------------------------------- The following portions of the patient's history were reviewed and updated as appropriate: allergies, current medications, past family history, past medical history, past social history, past surgical history and problem list. Problem list updated.   Objective  Blood pressure 128/72, height 5\' 5"  (1.651 m), weight (!) 310 lb 9.6 oz (140.9 kg), last menstrual period 11/17/2020. Pregravid weight 295 lb (133.8 kg) Total Weight Gain 15 lb 9.6 oz (7.076 kg) Urinalysis:      Fetal Status: Fetal Heart Rate (bpm): 154   Movement: Present     General:  Alert, oriented and cooperative. Patient is in no acute distress.  Skin: Skin is warm and dry. No rash noted.   Cardiovascular: Normal heart rate noted  Respiratory: Normal respiratory effort, no problems with respiration noted  Abdomen: Soft, gravid, appropriate for gestational age. Pain/Pressure: Absent     Pelvic:  Cervical exam deferred        Extremities: Normal range of motion.  Edema: None  Mental Status: Normal mood and affect. Normal behavior. Normal judgment and thought content.     Assessment   25 y.o. G3P0020 at [redacted]w[redacted]d by  11/23/2021, by Ultrasound presenting for routine prenatal visit  Plan   Pregnancy3  Problems (from 04/08/21 to present)     Problem Noted Resolved   Obesity affecting pregnancy in first trimester 04/15/2021 by 06/15/2021, MD No   Overview Signed 04/15/2021 11:16 AM by 06/15/2021, MD    BMI >=40 [ ]  screen sleep apnea [ ]  anesthesia consult (early and late if BMI > 45) [x ] u/s for dating [x ]  [x ] nutritional goals [x ] folic acid 1mg  [ ]  bASA (>12 weeks) [ ]  consider nutrition consult [ ]  consider maternal EKG 1st trimester [ ]  Growth u/s 28 [ ] , 32 [ ] , 36 weeks [ ]  [ ]  NST/AFI weekly 34+ weeks (34[] ,35[] ,36[] , 37[] , 38[] , 39[] , 40[] ) [ ]  IOL by 41 weeks (scheduled, prn [] )        Pregnancy with type 2 diabetes mellitus in first trimester 04/15/2021 by , MD No   Overview Signed 04/15/2021 11:15 AM by , MD    Current Diabetic Medications:  Metformin  [ ]  Aspirin 81 mg daily after 12 weeks; discontinue after 36 weeks (? A2/B GDM)  For A2/B GDM or higher classes of DM [ ]  Diabetes Education and Testing Supplies [ ]  Nutrition Counsult [ ]  Fetal ECHO after 22-24 weeks  [ ]  Eye exam for retina evaluation  [ ]  Baseline EKG [ ]  fetal growth every 4 weeks starting at 28 weeks [ ]  Twice weekly NST starting at [redacted] weeks gestation [ ]  Delivery planning contingent on fetal growth, AFI, glycemic control, and other co-morbidities but at least by 39 weeks  Baseline and surveillance labs (pulled in from [facility], refresh links as needed)  Lab Results  Component Value Date   CREATININE 0.70 07/24/2020   AST  15 03/31/2020   ALT 12 03/31/2020   Lab Results  Component Value Date   HGBA1C 6.6 (H) 03/31/2020   Antenatal Testing Class of DM U/S NST/AFI DELIVERY  Diabetes   A1 - good control - O24.410    A2 - good control - O24.419      A2  - poor control or poor compliance - O24.419, E11.65   (Macrosomia or polyhydramnios) **E11.65 is extra code for poor control**    A2/B - O24.919  and B-C O24.319  Poor control B-C or  D-R-F-T - O24.319  or  Type I DM - O24.019  20-38  20-38  20-24-28-32-36   20-24-28-32-35-38//fetal echo  20-24-27-30-33-36-38//fetal echo  40  32//2 x wk  32//2 x wk   32//2 x wk  28//BPP wkly then 32//2 x wk  40  39  PRN   39  PRN           Supervision of other high risk pregnancies, second trimester 04/08/2021 by Natale Milch, MD No   Overview Addendum 05/14/2021  4:28 PM by Nadara Mustard, MD     Nursing Staff Provider  Office Location  [facility] Dating  [place] at 6 wks  Language  English Anatomy US  MFM 19 wks [ ] ; ECHO 22 wks [ ]   Flu Vaccine   Genetic Screen  NIPS:nml XY   TDaP vaccine    Hgb A1C or  GTT GDMB   Covid vaccinated   LAB RESULTS   Rhogam  n/a Blood Type A/Positive/-- (05/04 1445)   Feeding Plan  Antibody Negative (05/04 1445)  Contraception  Rubella 1.03 (05/04 1445)  Circumcision  RPR Non Reactive (05/04 1445)   Pediatrician   HBsAg Negative (05/04 1445)   Support Person Husband HIV Non Reactive (05/04 1445)  Prenatal Classes  Varicella Immune    GBS  (For PCN allergy, check sensitivities)   BTL Consent     VBAC Consent n/a Pap  04/08/2021    Hgb Electro         Obesity in pregnancy: pregravid BMI: 49 Type 2 diabetes in pregnancy               Will increase Lantus to 10 units at bedtime., 2 units of Novolog with meals.  She has started 81 mg daily aspirin  Gestational age appropriate obstetric precautions including but not limited to vaginal bleeding, contractions, leaking of fluid and fetal movement were reviewed in detail with the patient.    Return in about 2 weeks (around 06/24/2021) for HROB with MD.  06/08/2021 MD [facility], [facility] 06/10/2021, 4:46 PM

## 2021-06-16 ENCOUNTER — Other Ambulatory Visit: Payer: Medicaid Other | Attending: Obstetrics & Gynecology

## 2021-06-26 ENCOUNTER — Ambulatory Visit (INDEPENDENT_AMBULATORY_CARE_PROVIDER_SITE_OTHER): Payer: Medicaid Other | Admitting: Obstetrics and Gynecology

## 2021-06-26 ENCOUNTER — Other Ambulatory Visit: Payer: Self-pay

## 2021-06-26 VITALS — BP 126/72 | Wt 316.0 lb

## 2021-06-26 DIAGNOSIS — O99211 Obesity complicating pregnancy, first trimester: Secondary | ICD-10-CM

## 2021-06-26 DIAGNOSIS — O09892 Supervision of other high risk pregnancies, second trimester: Secondary | ICD-10-CM

## 2021-06-26 DIAGNOSIS — O24111 Pre-existing diabetes mellitus, type 2, in pregnancy, first trimester: Secondary | ICD-10-CM

## 2021-06-26 MED ORDER — INSULIN GLARGINE 100 UNIT/ML SOLOSTAR PEN: 20.0000 [IU] | PEN_INJECTOR | Freq: Every day | SUBCUTANEOUS | 11 refills | Status: DC

## 2021-06-26 NOTE — Progress Notes (Signed)
HROB - on and off cramping x 3 days ago, no spotting. RM 4

## 2021-06-26 NOTE — Progress Notes (Signed)
Routine Prenatal Care Visit  Subjective  Robin Houston is a 25 y.o. G3P0020 at [redacted]w[redacted]d being seen today for ongoing prenatal care.  She is currently monitored for the following issues for this high-risk pregnancy and has Symptomatic anemia; Supervision of other high risk pregnancies, second trimester; Obesity affecting pregnancy in first trimester; and Pregnancy complicated by pre-existing type 2 diabetes on their problem list.  ----------------------------------------------------------------------------------- Patient reports no complaints.   Contractions: Not present. Vag. Bleeding: None.  Movement: Present. Denies leaking of fluid.  ----------------------------------------------------------------------------------- The following portions of the patient's history were reviewed and updated as appropriate: allergies, current medications, past family history, past medical history, past social history, past surgical history and problem list. Problem list updated.   Objective  Blood pressure 126/72, weight (!) 316 lb (143.3 kg), last menstrual period 11/17/2020. Pregravid weight 295 lb (133.8 kg) Total Weight Gain 21 lb (9.526 kg) Body mass index is 52.59 kg/m.  Urinalysis:      Fetal Status: Fetal Heart Rate (bpm): 150   Movement: Present     General:  Alert, oriented and cooperative. Patient is in no acute distress.  Skin: Skin is warm and dry. No rash noted.   Cardiovascular: Normal heart rate noted  Respiratory: Normal respiratory effort, no problems with respiration noted  Abdomen: Soft, gravid, appropriate for gestational age. Pain/Pressure: Absent     Pelvic:  Cervical exam deferred        Extremities: Normal range of motion.     ental Status: Normal mood and affect. Normal behavior. Normal judgment and thought content.     Assessment   25 y.o. G3P0020 at [redacted]w[redacted]d by  11/23/2021, by Ultrasound presenting for routine prenatal visit  Plan   Pregnancy3 Problems (from 04/08/21  to present)     Problem Noted Resolved   Obesity affecting pregnancy in first trimester 04/15/2021 by Nadara Mustard, MD No   Overview Signed 04/15/2021 11:16 AM by Nadara Mustard, MD    BMI >=40 [ ]  screen sleep apnea [ ]  anesthesia consult (early and late if BMI > 45) [x ] u/s for dating [x ]  [x ] nutritional goals [x ] folic acid 1mg  [ ]  bASA (>12 weeks) [ ]  consider nutrition consult [ ]  consider maternal EKG 1st trimester [ ]  Growth u/s 28 [ ] , 32 [ ] , 36 weeks [ ]  [ ]  NST/AFI weekly 34+ weeks (34[] ,35[] ,36[] , 37[] , 38[] , 39[] , 40[] ) [ ]  IOL by 41 weeks (scheduled, prn [] )       Pregnancy with type 2 diabetes mellitus in first trimester 04/15/2021 by , MD No   Overview Signed 04/15/2021 11:15 AM by , MD    Current Diabetic Medications:  Metformin  [ ]  Aspirin 81 mg daily after 12 weeks; discontinue after 36 weeks (? A2/B GDM)  For A2/B GDM or higher classes of DM [ ]  Diabetes Education and Testing Supplies [ ]  Nutrition Counsult [ ]  Fetal ECHO after 22-24 weeks  [ ]  Eye exam for retina evaluation  [ ]  Baseline EKG [ ]  fetal growth every 4 weeks starting at 28 weeks [ ]  Twice weekly NST starting at [redacted] weeks gestation [ ]  Delivery planning contingent on fetal growth, AFI, glycemic control, and other co-morbidities but at least by 39 weeks  Baseline and surveillance labs (pulled in from [facility], refresh links as needed)  Lab Results  Component Value Date   CREATININE 0.70 07/24/2020   AST 15 03/31/2020   ALT  12 03/31/2020   Lab Results  Component Value Date   HGBA1C 6.6 (H) 03/31/2020   Antenatal Testing Class of DM U/S NST/AFI DELIVERY  Diabetes   A1 - good control - O24.410    A2 - good control - O24.419      A2  - poor control or poor compliance - O24.419, E11.65   (Macrosomia or polyhydramnios) **E11.65 is extra code for poor control**    A2/B - O24.919  and B-C O24.319  Poor control B-C or D-R-F-T - O24.319  or  Type I  DM - O24.019  20-38  20-38  20-24-28-32-36   20-24-28-32-35-38//fetal echo  20-24-27-30-33-36-38//fetal echo  40  32//2 x wk  32//2 x wk   32//2 x wk  28//BPP wkly then 32//2 x wk  40  39  PRN   39  PRN          Supervision of other high risk pregnancies, second trimester 04/08/2021 by Natale Milch, MD No   Overview Addendum 05/14/2021  4:28 PM by Nadara Mustard, MD     Nursing Staff Provider  Office Location  [facility] Dating  [place] at 6 wks  Language  English Anatomy US  MFM 19 wks [ ] ; ECHO 22 wks [ ]   Flu Vaccine   Genetic Screen  NIPS:nml XY   TDaP vaccine    Hgb A1C or  GTT GDMB   Covid vaccinated   LAB RESULTS   Rhogam  n/a Blood Type A/Positive/-- (05/04 1445)   Feeding Plan  Antibody Negative (05/04 1445)  Contraception  Rubella 1.03 (05/04 1445)  Circumcision  RPR Non Reactive (05/04 1445)   Pediatrician   HBsAg Negative (05/04 1445)   Support Person Husband HIV Non Reactive (05/04 1445)  Prenatal Classes  Varicella Immune    GBS  (For PCN allergy, check sensitivities)   BTL Consent     VBAC Consent n/a Pap  04/08/2021    Hgb Electro         Obesity in pregnancy: pregravid BMI: 49 Type 2 diabetes in pregnancy           Gestational age appropriate obstetric precautions including but not limited to vaginal bleeding, contractions, leaking of fluid and fetal movement were reviewed in detail with the patient.    FHT 150 anatomy scan next week, increase long acting to 20U qhS.  Meal sugars are getting close to goal but fasting still in low 100's to 110's consistently  Return in about 2 weeks (around 07/10/2021) for ROB MD only.  06/08/2021, MD, 09/09/2021 [facility], [facility] 06/26/2021, 3:04 PM

## 2021-07-03 ENCOUNTER — Other Ambulatory Visit: Payer: Self-pay | Admitting: *Deleted

## 2021-07-03 ENCOUNTER — Ambulatory Visit: Payer: Medicaid Other | Attending: Obstetrics

## 2021-07-03 ENCOUNTER — Other Ambulatory Visit: Payer: Self-pay

## 2021-07-03 ENCOUNTER — Ambulatory Visit: Payer: Medicaid Other | Admitting: *Deleted

## 2021-07-03 VITALS — BP 125/62 | HR 90

## 2021-07-03 DIAGNOSIS — Z363 Encounter for antenatal screening for malformations: Secondary | ICD-10-CM | POA: Diagnosis not present

## 2021-07-03 DIAGNOSIS — O24111 Pre-existing diabetes mellitus, type 2, in pregnancy, first trimester: Secondary | ICD-10-CM | POA: Insufficient documentation

## 2021-07-03 DIAGNOSIS — O99212 Obesity complicating pregnancy, second trimester: Secondary | ICD-10-CM

## 2021-07-03 DIAGNOSIS — O09892 Supervision of other high risk pregnancies, second trimester: Secondary | ICD-10-CM | POA: Diagnosis present

## 2021-07-03 DIAGNOSIS — O99211 Obesity complicating pregnancy, first trimester: Secondary | ICD-10-CM

## 2021-07-03 DIAGNOSIS — O24112 Pre-existing diabetes mellitus, type 2, in pregnancy, second trimester: Secondary | ICD-10-CM

## 2021-07-03 DIAGNOSIS — Z3A19 19 weeks gestation of pregnancy: Secondary | ICD-10-CM

## 2021-07-03 DIAGNOSIS — Z362 Encounter for other antenatal screening follow-up: Secondary | ICD-10-CM

## 2021-07-03 DIAGNOSIS — Z794 Long term (current) use of insulin: Secondary | ICD-10-CM

## 2021-07-03 DIAGNOSIS — E119 Type 2 diabetes mellitus without complications: Secondary | ICD-10-CM | POA: Diagnosis not present

## 2021-07-10 ENCOUNTER — Ambulatory Visit (INDEPENDENT_AMBULATORY_CARE_PROVIDER_SITE_OTHER): Payer: Medicaid Other | Admitting: Obstetrics and Gynecology

## 2021-07-10 ENCOUNTER — Other Ambulatory Visit: Payer: Self-pay

## 2021-07-10 ENCOUNTER — Encounter: Payer: Self-pay | Admitting: Obstetrics and Gynecology

## 2021-07-10 VITALS — BP 124/70 | Ht 65.0 in | Wt 320.4 lb

## 2021-07-10 DIAGNOSIS — O09892 Supervision of other high risk pregnancies, second trimester: Secondary | ICD-10-CM

## 2021-07-10 DIAGNOSIS — Z3A20 20 weeks gestation of pregnancy: Secondary | ICD-10-CM

## 2021-07-10 DIAGNOSIS — O24111 Pre-existing diabetes mellitus, type 2, in pregnancy, first trimester: Secondary | ICD-10-CM

## 2021-07-10 LAB — POCT URINALYSIS DIPSTICK OB
Glucose, UA: NEGATIVE
POC,PROTEIN,UA: NEGATIVE

## 2021-07-10 MED ORDER — INSULIN ASPART 100 UNIT/ML FLEXPEN: 6.0000 [IU] | PEN_INJECTOR | Freq: Three times a day (TID) | SUBCUTANEOUS | 11 refills | Status: DC

## 2021-07-10 NOTE — Progress Notes (Signed)
Routine Prenatal Care Visit  Subjective  Robin Houston is a 25 y.o. G3P0020 at [redacted]w[redacted]d being seen today for ongoing prenatal care.  She is currently monitored for the following issues for this high-risk pregnancy and has Symptomatic anemia; Supervision of other high risk pregnancies, second trimester; Obesity affecting pregnancy in first trimester; and Pregnancy complicated by pre-existing type 2 diabetes on their problem list.  ----------------------------------------------------------------------------------- Patient reports no complaints.   Contractions: Not present. Vag. Bleeding: None.  Movement: Present. Denies leaking of fluid.  ----------------------------------------------------------------------------------- The following portions of the patient's history were reviewed and updated as appropriate: allergies, current medications, past family history, past medical history, past social history, past surgical history and problem list. Problem list updated.   Objective  Blood pressure 124/70, height 5\' 5"  (1.651 m), weight (!) 320 lb 6.4 oz (145.3 kg), last menstrual period 11/17/2020. Pregravid weight 295 lb (133.8 kg) Total Weight Gain 25 lb 6.4 oz (11.5 kg) Urinalysis:      Fetal Status: Fetal Heart Rate (bpm): 145   Movement: Present     General:  Alert, oriented and cooperative. Patient is in no acute distress.  Skin: Skin is warm and dry. No rash noted.   Cardiovascular: Normal heart rate noted  Respiratory: Normal respiratory effort, no problems with respiration noted  Abdomen: Soft, gravid, appropriate for gestational age. Pain/Pressure: Absent     Pelvic:  Cervical exam deferred        Extremities: Normal range of motion.  Edema: None  Mental Status: Normal mood and affect. Normal behavior. Normal judgment and thought content.      Assessment   25 y.o. G3P0020 at [redacted]w[redacted]d by  11/23/2021, by Ultrasound presenting for routine prenatal visit  Plan   Pregnancy3 Problems  (from 04/08/21 to present)     Problem Noted Resolved   Obesity affecting pregnancy in first trimester 04/15/2021 by 06/15/2021, MD No   Overview Signed 04/15/2021 11:16 AM by 06/15/2021, MD    BMI >=40 [ ]  screen sleep apnea [ ]  anesthesia consult (early and late if BMI > 45) [x ] u/s for dating [x ]  [x ] nutritional goals [x ] folic acid 1mg  [ ]  bASA (>12 weeks) [ ]  consider nutrition consult [ ]  consider maternal EKG 1st trimester [ ]  Growth u/s 28 [ ] , 32 [ ] , 36 weeks [ ]  [ ]  NST/AFI weekly 34+ weeks (34[] ,35[] ,36[] , 37[] , 38[] , 39[] , 40[] ) [ ]  IOL by 41 weeks (scheduled, prn [] )       Pregnancy complicated by pre-existing type 2 diabetes 04/15/2021 by , MD No   Overview Signed 04/15/2021 11:15 AM by , MD    Current Diabetic Medications:  Metformin  [ ]  Aspirin 81 mg daily after 12 weeks; discontinue after 36 weeks (? A2/B GDM)  For A2/B GDM or higher classes of DM [ ]  Diabetes Education and Testing Supplies [ ]  Nutrition Counsult [ ]  Fetal ECHO after 22-24 weeks  [ ]  Eye exam for retina evaluation  [ ]  Baseline EKG [ ]  fetal growth every 4 weeks starting at 28 weeks [ ]  Twice weekly NST starting at [redacted] weeks gestation [ ]  Delivery planning contingent on fetal growth, AFI, glycemic control, and other co-morbidities but at least by 39 weeks  Baseline and surveillance labs (pulled in from [facility], refresh links as needed)  Lab Results  Component Value Date   CREATININE 0.70 07/24/2020   AST 15 03/31/2020  ALT 12 03/31/2020   Lab Results  Component Value Date   HGBA1C 6.6 (H) 03/31/2020   Antenatal Testing Class of DM U/S NST/AFI DELIVERY  Diabetes   A1 - good control - O24.410    A2 - good control - O24.419      A2  - poor control or poor compliance - O24.419, E11.65   (Macrosomia or polyhydramnios) **E11.65 is extra code for poor control**    A2/B - O24.919  and B-C O24.319  Poor control B-C or D-R-F-T - O24.319   or  Type I DM - O24.019  20-38  20-38  20-24-28-32-36   20-24-28-32-35-38//fetal echo  20-24-27-30-33-36-38//fetal echo  40  32//2 x wk  32//2 x wk   32//2 x wk  28//BPP wkly then 32//2 x wk  40  39  PRN   39  PRN          Supervision of other high risk pregnancies, second trimester 04/08/2021 by Natale Milch, MD No   Overview Addendum 07/10/2021  3:27 PM by Natale Milch, MD     Nursing Staff Provider  Office Location  [facility] Dating  [place] at 6 wks  Language  English Anatomy US  MFM 19 wks complete ECHO 22 wks [ ]   Flu Vaccine   Genetic Screen  NIPS:nml XY   TDaP vaccine    Hgb A1C or  GTT GDMB   Covid vaccinated   LAB RESULTS   Rhogam  n/a Blood Type A/Positive/-- (05/04 1445)   Feeding Plan  Antibody Negative (05/04 1445)  Contraception  Rubella 1.03 (05/04 1445)  Circumcision  RPR Non Reactive (05/04 1445)   Pediatrician   HBsAg Negative (05/04 1445)   Support Person Husband HIV Non Reactive (05/04 1445)  Prenatal Classes  Varicella Immune    GBS  (For PCN allergy, check sensitivities)   BTL Consent     VBAC Consent n/a Pap  04/08/2021    Hgb Electro         Obesity in pregnancy: pregravid BMI: 49 Type 2 diabetes in pregnancy 07/10/2021 increased to:   Lantus 20 units Novolog 6 units with meals          Increase short acting insulin with meals to 6 units  Gestational age appropriate obstetric precautions including but not limited to vaginal bleeding, contractions, leaking of fluid and fetal movement were reviewed in detail with the patient.    Return in about 2 weeks (around 07/24/2021) for HROB with MD.  07/26/2021 MD [facility], [facility] 07/10/2021, 3:31 PM

## 2021-07-10 NOTE — Patient Instructions (Signed)

## 2021-07-27 ENCOUNTER — Encounter: Payer: Medicaid Other | Admitting: Obstetrics and Gynecology

## 2021-07-28 ENCOUNTER — Other Ambulatory Visit: Payer: Self-pay

## 2021-07-28 ENCOUNTER — Ambulatory Visit (INDEPENDENT_AMBULATORY_CARE_PROVIDER_SITE_OTHER): Payer: Medicaid Other | Admitting: Obstetrics and Gynecology

## 2021-07-28 VITALS — BP 124/70 | Wt 329.0 lb

## 2021-07-28 DIAGNOSIS — O99211 Obesity complicating pregnancy, first trimester: Secondary | ICD-10-CM

## 2021-07-28 DIAGNOSIS — O24112 Pre-existing diabetes mellitus, type 2, in pregnancy, second trimester: Secondary | ICD-10-CM

## 2021-07-28 DIAGNOSIS — O09892 Supervision of other high risk pregnancies, second trimester: Secondary | ICD-10-CM

## 2021-07-28 DIAGNOSIS — Z3A23 23 weeks gestation of pregnancy: Secondary | ICD-10-CM

## 2021-07-28 LAB — POCT URINALYSIS DIPSTICK OB
Glucose, UA: NEGATIVE
POC,PROTEIN,UA: NEGATIVE

## 2021-07-28 NOTE — Progress Notes (Signed)
Routine Prenatal Care Visit  Subjective  Robin Houston is a 25 y.o. G3P0020 at [redacted]w[redacted]d being seen today for ongoing prenatal care.  She is currently monitored for the following issues for this high-risk pregnancy and has Symptomatic anemia; Supervision of other high risk pregnancies, second trimester; Obesity affecting pregnancy in first trimester; and Pregnancy complicated by pre-existing type 2 diabetes on their problem list.  ----------------------------------------------------------------------------------- Patient reports no complaints.   Contractions: Not present. Vag. Bleeding: None.  Movement: Present. Denies leaking of fluid.  ----------------------------------------------------------------------------------- The following portions of the patient's history were reviewed and updated as appropriate: allergies, current medications, past family history, past medical history, past social history, past surgical history and problem list. Problem list updated.   Objective  Blood pressure 124/70, weight (!) 329 lb (149.2 kg), last menstrual period 11/17/2020. Pregravid weight 295 lb (133.8 kg) Total Weight Gain 34 lb (15.4 kg) Urinalysis:      Fetal Status: Fetal Heart Rate (bpm): 147   Movement: Present     General:  Alert, oriented and cooperative. Patient is in no acute distress.  Skin: Skin is warm and dry. No rash noted.   Cardiovascular: Normal heart rate noted  Respiratory: Normal respiratory effort, no problems with respiration noted  Abdomen: Soft, gravid, appropriate for gestational age. Pain/Pressure: Absent     Pelvic:  Cervical exam deferred        Extremities: Normal range of motion.     ental Status: Normal mood and affect. Normal behavior. Normal judgment and thought content.   Date Fasting Breakfast Lunch Dinner  8/16 97 107 115 128  8/17 90 108 120 137  8/18 92 117 122 130  8/19 94 103 117 138  8/20 95 108 122 134  8/21 92 105 116 118  8/22 93 100 114 120    Assessment   24 y.o. G3P0020 at [redacted]w[redacted]d by  11/23/2021, by Ultrasound presenting for routine prenatal visit  Plan   Pregnancy3 Problems (from 04/08/21 to present)     Problem Noted Resolved   Obesity affecting pregnancy in first trimester 04/15/2021 by Nadara Mustard, MD No   Overview Signed 04/15/2021 11:16 AM by Nadara Mustard, MD    BMI >=40 [ ]  screen sleep apnea [ ]  anesthesia consult (early and late if BMI > 45) [x ] u/s for dating [x ]  [x ] nutritional goals [x ] folic acid 1mg  [ ]  bASA (>12 weeks) [ ]  consider nutrition consult [ ]  consider maternal EKG 1st trimester [ ]  Growth u/s 28 [ ] , 32 [ ] , 36 weeks [ ]  [ ]  NST/AFI weekly 34+ weeks (34[] ,35[] ,36[] , 37[] , 38[] , 39[] , 40[] ) [ ]  IOL by 41 weeks (scheduled, prn [] )       Pregnancy complicated by pre-existing type 2 diabetes 04/15/2021 by , MD No   Overview Signed 04/15/2021 11:15 AM by , MD    Current Diabetic Medications:  Metformin  [ ]  Aspirin 81 mg daily after 12 weeks; discontinue after 36 weeks (? A2/B GDM)  For A2/B GDM or higher classes of DM [ ]  Diabetes Education and Testing Supplies [ ]  Nutrition Counsult [ ]  Fetal ECHO after 22-24 weeks  [ ]  Eye exam for retina evaluation  [ ]  Baseline EKG [ ]  fetal growth every 4 weeks starting at 28 weeks [ ]  Twice weekly NST starting at [redacted] weeks gestation [ ]  Delivery planning contingent on fetal growth, AFI, glycemic control, and other co-morbidities but  at least by 39 weeks  Baseline and surveillance labs (pulled in from [facility], refresh links as needed)  Lab Results  Component Value Date   CREATININE 0.70 07/24/2020   AST 15 03/31/2020   ALT 12 03/31/2020   Lab Results  Component Value Date   HGBA1C 6.6 (H) 03/31/2020   Antenatal Testing Class of DM U/S NST/AFI DELIVERY  Diabetes   A1 - good control - O24.410    A2 - good control - O24.419      A2  - poor control or poor compliance - O24.419, E11.65    (Macrosomia or polyhydramnios) **E11.65 is extra code for poor control**    A2/B - O24.919  and B-C O24.319  Poor control B-C or D-R-F-T - O24.319  or  Type I DM - O24.019  20-38  20-38  20-24-28-32-36   20-24-28-32-35-38//fetal echo  20-24-27-30-33-36-38//fetal echo  40  32//2 x wk  32//2 x wk   32//2 x wk  28//BPP wkly then 32//2 x wk  40  39  PRN   39  PRN          Supervision of other high risk pregnancies, second trimester 04/08/2021 by Natale Milch, MD No   Overview Addendum 07/10/2021  3:27 PM by Natale Milch, MD     Nursing Staff Provider  Office Location  [facility] Dating  [place] at 6 wks  Language  English Anatomy US  MFM 19 wks complete ECHO 22 wks [ ]   Flu Vaccine   Genetic Screen  NIPS:nml XY   TDaP vaccine    Hgb A1C or  GTT GDMB   Covid vaccinated   LAB RESULTS   Rhogam  n/a Blood Type A/Positive/-- (05/04 1445)   Feeding Plan  Antibody Negative (05/04 1445)  Contraception  Rubella 1.03 (05/04 1445)  Circumcision  RPR Non Reactive (05/04 1445)   Pediatrician   HBsAg Negative (05/04 1445)   Support Person Husband HIV Non Reactive (05/04 1445)  Prenatal Classes  Varicella Immune    GBS  (For PCN allergy, check sensitivities)   BTL Consent     VBAC Consent n/a Pap  04/08/2021    Hgb Electro         Obesity in pregnancy: pregravid BMI: 49 Type 2 diabetes in pregnancy 07/10/2021 increased to:   Lantus 20 units Novolog 6 units with meals           Gestational age appropriate obstetric precautions including but not limited to vaginal bleeding, contractions, leaking of fluid and fetal movement were reviewed in detail with the patient.    - anatomy scan at MFM complete, fetal ECHO scheduled for 07/31/21 - BG good control with some mild dinner elevations, if dinner remians an issue inrease dinner time novolog   Return in about 2 weeks (around 08/11/2021) for ROB MD.  10/11/2021, MD, [facility], [facility] 07/28/2021, 9:56 AM

## 2021-07-28 NOTE — Progress Notes (Signed)
ROB - no concerns. RM 6 

## 2021-07-30 ENCOUNTER — Other Ambulatory Visit: Payer: Self-pay | Admitting: Obstetrics & Gynecology

## 2021-07-31 ENCOUNTER — Other Ambulatory Visit: Payer: Self-pay

## 2021-07-31 ENCOUNTER — Other Ambulatory Visit: Payer: Self-pay | Admitting: *Deleted

## 2021-07-31 ENCOUNTER — Ambulatory Visit: Payer: Medicaid Other | Attending: Obstetrics

## 2021-07-31 ENCOUNTER — Ambulatory Visit: Payer: Medicaid Other | Admitting: *Deleted

## 2021-07-31 ENCOUNTER — Encounter: Payer: Self-pay | Admitting: *Deleted

## 2021-07-31 VITALS — BP 121/58 | HR 102

## 2021-07-31 DIAGNOSIS — O99211 Obesity complicating pregnancy, first trimester: Secondary | ICD-10-CM | POA: Insufficient documentation

## 2021-07-31 DIAGNOSIS — Z362 Encounter for other antenatal screening follow-up: Secondary | ICD-10-CM | POA: Diagnosis present

## 2021-07-31 DIAGNOSIS — O09892 Supervision of other high risk pregnancies, second trimester: Secondary | ICD-10-CM

## 2021-07-31 DIAGNOSIS — O24112 Pre-existing diabetes mellitus, type 2, in pregnancy, second trimester: Secondary | ICD-10-CM

## 2021-07-31 DIAGNOSIS — O24312 Unspecified pre-existing diabetes mellitus in pregnancy, second trimester: Secondary | ICD-10-CM

## 2021-08-06 ENCOUNTER — Telehealth: Payer: Self-pay

## 2021-08-06 NOTE — Telephone Encounter (Signed)
FETAL ECHO SCHEDULED FOR 07/31/21@800AM 

## 2021-08-11 ENCOUNTER — Ambulatory Visit (INDEPENDENT_AMBULATORY_CARE_PROVIDER_SITE_OTHER): Payer: Medicaid Other | Admitting: Obstetrics and Gynecology

## 2021-08-11 ENCOUNTER — Other Ambulatory Visit: Payer: Self-pay

## 2021-08-11 ENCOUNTER — Encounter: Payer: Self-pay | Admitting: Obstetrics and Gynecology

## 2021-08-11 VITALS — BP 130/70 | Ht 65.0 in | Wt 330.8 lb

## 2021-08-11 DIAGNOSIS — O24111 Pre-existing diabetes mellitus, type 2, in pregnancy, first trimester: Secondary | ICD-10-CM

## 2021-08-11 DIAGNOSIS — O24112 Pre-existing diabetes mellitus, type 2, in pregnancy, second trimester: Secondary | ICD-10-CM

## 2021-08-11 DIAGNOSIS — O09892 Supervision of other high risk pregnancies, second trimester: Secondary | ICD-10-CM

## 2021-08-11 DIAGNOSIS — Z3A25 25 weeks gestation of pregnancy: Secondary | ICD-10-CM

## 2021-08-11 LAB — POCT URINALYSIS DIPSTICK OB
Glucose, UA: NEGATIVE
POC,PROTEIN,UA: NEGATIVE

## 2021-08-11 MED ORDER — INSULIN ASPART 100 UNIT/ML FLEXPEN: 8.0000 [IU] | PEN_INJECTOR | Freq: Three times a day (TID) | SUBCUTANEOUS | 11 refills | Status: DC

## 2021-08-11 NOTE — Patient Instructions (Signed)

## 2021-08-11 NOTE — Progress Notes (Signed)
Routine Prenatal Care Visit  Subjective  Robin Houston is a 24 y.o. G3P0020 at [redacted]w[redacted]d being seen today for ongoing prenatal care.  She is currently monitored for the following issues for this low-risk pregnancy and has Symptomatic anemia; Supervision of other high risk pregnancies, second trimester; Obesity affecting pregnancy in second trimester; and Pregnancy complicated by pre-existing type 2 diabetes on their problem list.  ----------------------------------------------------------------------------------- Patient reports no complaints.   Contractions: Not present. Vag. Bleeding: None.  Movement: Present. Denies leaking of fluid.  ----------------------------------------------------------------------------------- The following portions of the patient's history were reviewed and updated as appropriate: allergies, current medications, past family history, past medical history, past social history, past surgical history and problem list. Problem list updated.   Objective  Blood pressure 130/70, height 5\' 5"  (1.651 m), weight (!) 330 lb 12.8 oz (150 kg), last menstrual period 11/17/2020. Pregravid weight 295 lb (133.8 kg) Total Weight Gain 35 lb 12.8 oz (16.2 kg) Urinalysis:      Fetal Status: Fetal Heart Rate (bpm): 140   Movement: Present     General:  Alert, oriented and cooperative. Patient is in no acute distress.  Skin: Skin is warm and dry. No rash noted.   Cardiovascular: Normal heart rate noted  Respiratory: Normal respiratory effort, no problems with respiration noted  Abdomen: Soft, gravid, appropriate for gestational age. Pain/Pressure: Absent     Pelvic:  Cervical exam deferred        Extremities: Normal range of motion.  Edema: None  Mental Status: Normal mood and affect. Normal behavior. Normal judgment and thought content.      Assessment   25 y.o. G3P0020 at [redacted]w[redacted]d by  11/23/2021, by Ultrasound presenting for routine prenatal visit  Plan   Pregnancy3 Problems  (from 04/08/21 to present)     Problem Noted Resolved   Obesity affecting pregnancy in second trimester 04/15/2021 by 06/15/2021, MD No   Overview Signed 04/15/2021 11:16 AM by 06/15/2021, MD    BMI >=40 [ ]  screen sleep apnea [ ]  anesthesia consult (early and late if BMI > 45) [x ] u/s for dating [x ]  [x ] nutritional goals [x ] folic acid 1mg  [ ]  bASA (>12 weeks) [ ]  consider nutrition consult [ ]  consider maternal EKG 1st trimester [ ]  Growth u/s 28 [ ] , 32 [ ] , 36 weeks [ ]  [ ]  NST/AFI weekly 34+ weeks (34[] ,35[] ,36[] , 37[] , 38[] , 39[] , 40[] ) [ ]  IOL by 41 weeks (scheduled, prn [] )       Pregnancy complicated by pre-existing type 2 diabetes 04/15/2021 by , MD No   Overview Signed 04/15/2021 11:15 AM by , MD    Current Diabetic Medications:  Metformin  [ ]  Aspirin 81 mg daily after 12 weeks; discontinue after 36 weeks (? A2/B GDM)  For A2/B GDM or higher classes of DM [ ]  Diabetes Education and Testing Supplies [ ]  Nutrition Counsult [ ]  Fetal ECHO after 22-24 weeks  [ ]  Eye exam for retina evaluation  [ ]  Baseline EKG [ ]  fetal growth every 4 weeks starting at 28 weeks [ ]  Twice weekly NST starting at [redacted] weeks gestation [ ]  Delivery planning contingent on fetal growth, AFI, glycemic control, and other co-morbidities but at least by 39 weeks  Baseline and surveillance labs (pulled in from [facility], refresh links as needed)  Lab Results  Component Value Date   CREATININE 0.70 07/24/2020   AST 15 03/31/2020  ALT 12 03/31/2020   Lab Results  Component Value Date   HGBA1C 6.6 (H) 03/31/2020   Antenatal Testing Class of DM U/S NST/AFI DELIVERY  Diabetes   A1 - good control - O24.410    A2 - good control - O24.419      A2  - poor control or poor compliance - O24.419, E11.65   (Macrosomia or polyhydramnios) **E11.65 is extra code for poor control**    A2/B - O24.919  and B-C O24.319  Poor control B-C or D-R-F-T - O24.319   or  Type I DM - O24.019  20-38  20-38  20-24-28-32-36   20-24-28-32-35-38//fetal echo  20-24-27-30-33-36-38//fetal echo  40  32//2 x wk  32//2 x wk   32//2 x wk  28//BPP wkly then 32//2 x wk  40  39  PRN   39  PRN          Supervision of other high risk pregnancies, second trimester 04/08/2021 by Natale Milch, MD No   Overview Addendum 08/11/2021  2:50 PM by Natale Milch, MD     Nursing Staff Provider  Office Location  [facility] Dating  [place] at 6 wks  Language  English Anatomy US  MFM 19 wks complete ECHO 22 wks [normal ]  Flu Vaccine   Genetic Screen  NIPS:nml XY   TDaP vaccine    Hgb A1C or  GTT GDMB   Covid vaccinated   LAB RESULTS   Rhogam  n/a Blood Type A/Positive/-- (05/04 1445)   Feeding Plan breast Antibody Negative (05/04 1445)  Contraception  Rubella 1.03 (05/04 1445)  Circumcision  RPR Non Reactive (05/04 1445)   Pediatrician   HBsAg Negative (05/04 1445)   Support Person Husband HIV Non Reactive (05/04 1445)  Prenatal Classes  discussed Varicella Immune    GBS  (For PCN allergy, check sensitivities)   BTL Consent     VBAC Consent n/a Pap  04/08/2021    Hgb Electro         Obesity in pregnancy: pregravid BMI: 49 Type 2 diabetes in pregnancy 07/10/2021 increased to:   Lantus 20 units Novolog 6 units with meals 08/11/2021 increased to: Lantus 20 units Novolog 6 units with breakfast, lunch 8 units with dinner           Gestational age appropriate obstetric precautions including but not limited to vaginal bleeding, contractions, leaking of fluid and fetal movement were reviewed in detail with the patient.    Return in about 2 weeks (around 08/25/2021) for ROB with MD.  Natale Milch MD [facility], [facility] 08/11/2021, 2:47 PM

## 2021-08-26 ENCOUNTER — Ambulatory Visit (INDEPENDENT_AMBULATORY_CARE_PROVIDER_SITE_OTHER): Payer: Medicaid Other | Admitting: Obstetrics and Gynecology

## 2021-08-26 ENCOUNTER — Other Ambulatory Visit: Payer: Self-pay

## 2021-08-26 ENCOUNTER — Encounter: Payer: Self-pay | Admitting: Obstetrics and Gynecology

## 2021-08-26 VITALS — BP 136/70 | Ht 65.0 in | Wt 335.0 lb

## 2021-08-26 DIAGNOSIS — O09892 Supervision of other high risk pregnancies, second trimester: Secondary | ICD-10-CM

## 2021-08-26 DIAGNOSIS — O24111 Pre-existing diabetes mellitus, type 2, in pregnancy, first trimester: Secondary | ICD-10-CM

## 2021-08-26 DIAGNOSIS — Z3A27 27 weeks gestation of pregnancy: Secondary | ICD-10-CM

## 2021-08-26 LAB — POCT URINALYSIS DIPSTICK OB
Glucose, UA: NEGATIVE
POC,PROTEIN,UA: NEGATIVE

## 2021-08-27 NOTE — Patient Instructions (Signed)

## 2021-08-27 NOTE — Progress Notes (Signed)
Routine Prenatal Care Visit  Subjective  Robin Houston is a 25 y.o. G3P0020 at [redacted]w[redacted]d being seen today for ongoing prenatal care.  She is currently monitored for the following issues for this high-risk pregnancy and has Symptomatic anemia; Supervision of other high risk pregnancies, second trimester; Obesity affecting pregnancy in second trimester; and Pregnancy complicated by pre-existing type 2 diabetes on their problem list.  ----------------------------------------------------------------------------------- Patient reports no complaints.   Contractions: Not present. Vag. Bleeding: None.  Movement: Present. Denies leaking of fluid.  ----------------------------------------------------------------------------------- The following portions of the patient's history were reviewed and updated as appropriate: allergies, current medications, past family history, past medical history, past social history, past surgical history and problem list. Problem list updated.   Objective  Blood pressure 136/70, height 5\' 5"  (1.651 m), weight (!) 335 lb (152 kg), last menstrual period 11/17/2020. Pregravid weight 295 lb (133.8 kg) Total Weight Gain 40 lb (18.1 kg) Urinalysis:      Fetal Status: Fetal Heart Rate (bpm): 140   Movement: Present     General:  Alert, oriented and cooperative. Patient is in no acute distress.  Skin: Skin is warm and dry. No rash noted.   Cardiovascular: Normal heart rate noted  Respiratory: Normal respiratory effort, no problems with respiration noted  Abdomen: Soft, gravid, appropriate for gestational age. Pain/Pressure: Absent     Pelvic:  Cervical exam deferred        Extremities: Normal range of motion.  Edema: None  Mental Status: Normal mood and affect. Normal behavior. Normal judgment and thought content.     Assessment   25 y.o. G3P0020 at [redacted]w[redacted]d by  11/23/2021, by Ultrasound presenting for routine prenatal visit  Plan   Pregnancy3 Problems (from 04/08/21 to  present)     Problem Noted Resolved   Obesity affecting pregnancy in second trimester 04/15/2021 by 06/15/2021, MD No   Overview Signed 04/15/2021 11:16 AM by 06/15/2021, MD    BMI >=40 [ ]  screen sleep apnea [ ]  anesthesia consult (early and late if BMI > 45) [x ] u/s for dating [x ]  [x ] nutritional goals [x ] folic acid 1mg  [ ]  bASA (>12 weeks) [ ]  consider nutrition consult [ ]  consider maternal EKG 1st trimester [ ]  Growth u/s 28 [ ] , 32 [ ] , 36 weeks [ ]  [ ]  NST/AFI weekly 34+ weeks (34[] ,35[] ,36[] , 37[] , 38[] , 39[] , 40[] ) [ ]  IOL by 41 weeks (scheduled, prn [] )       Pregnancy complicated by pre-existing type 2 diabetes 04/15/2021 by , MD No   Overview Signed 04/15/2021 11:15 AM by , MD    Current Diabetic Medications:  Metformin  [ ]  Aspirin 81 mg daily after 12 weeks; discontinue after 36 weeks (? A2/B GDM)  For A2/B GDM or higher classes of DM [ ]  Diabetes Education and Testing Supplies [ ]  Nutrition Counsult [ ]  Fetal ECHO after 22-24 weeks  [ ]  Eye exam for retina evaluation  [ ]  Baseline EKG [ ]  fetal growth every 4 weeks starting at 28 weeks [ ]  Twice weekly NST starting at [redacted] weeks gestation [ ]  Delivery planning contingent on fetal growth, AFI, glycemic control, and other co-morbidities but at least by 39 weeks  Baseline and surveillance labs (pulled in from [facility], refresh links as needed)  Lab Results  Component Value Date   CREATININE 0.70 07/24/2020   AST 15 03/31/2020   ALT 12 03/31/2020  Lab Results  Component Value Date   HGBA1C 6.6 (H) 03/31/2020   Antenatal Testing Class of DM U/S NST/AFI DELIVERY  Diabetes   A1 - good control - O24.410    A2 - good control - O24.419      A2  - poor control or poor compliance - O24.419, E11.65   (Macrosomia or polyhydramnios) **E11.65 is extra code for poor control**    A2/B - O24.919  and B-C O24.319  Poor control B-C or D-R-F-T - O24.319  or  Type I DM -  O24.019  20-38  20-38  20-24-28-32-36   20-24-28-32-35-38//fetal echo  20-24-27-30-33-36-38//fetal echo  40  32//2 x wk  32//2 x wk   32//2 x wk  28//BPP wkly then 32//2 x wk  40  39  PRN   39  PRN          Supervision of other high risk pregnancies, second trimester 04/08/2021 by Natale Milch, MD No   Overview Addendum 08/11/2021  2:50 PM by Natale Milch, MD     Nursing Staff Provider  Office Location  [facility] Dating  [place] at 6 wks  Language  English Anatomy US  MFM 19 wks complete ECHO 22 wks [normal ]  Flu Vaccine   Genetic Screen  NIPS:nml XY   TDaP vaccine    Hgb A1C or  GTT GDMB   Covid vaccinated   LAB RESULTS   Rhogam  n/a Blood Type A/Positive/-- (05/04 1445)   Feeding Plan breast Antibody Negative (05/04 1445)  Contraception  Rubella 1.03 (05/04 1445)  Circumcision  RPR Non Reactive (05/04 1445)   Pediatrician   HBsAg Negative (05/04 1445)   Support Person Husband HIV Non Reactive (05/04 1445)  Prenatal Classes  discussed Varicella Immune    GBS  (For PCN allergy, check sensitivities)   BTL Consent     VBAC Consent n/a Pap  04/08/2021    Hgb Electro         Obesity in pregnancy: pregravid BMI: 49 Type 2 diabetes in pregnancy 07/10/2021 increased to:   Lantus 20 units Novolog 6 units with meals 08/11/2021 increased to: Lantus 20 units Novolog 6 units with breakfast, lunch 8 units with dinner              Gestational age appropriate obstetric precautions including but not limited to vaginal bleeding, contractions, leaking of fluid and fetal movement were reviewed in detail with the patient.    Return in about 2 weeks (around 09/09/2021) for ROB every 2 weeks for 4 visits with MD.  Natale Milch MD [facility], [facility] 08/27/2021, 7:51 AM

## 2021-08-28 ENCOUNTER — Encounter: Payer: Self-pay | Admitting: *Deleted

## 2021-08-28 ENCOUNTER — Ambulatory Visit: Payer: Medicaid Other | Admitting: *Deleted

## 2021-08-28 ENCOUNTER — Other Ambulatory Visit: Payer: Self-pay

## 2021-08-28 ENCOUNTER — Ambulatory Visit: Payer: Medicaid Other | Attending: Obstetrics

## 2021-08-28 ENCOUNTER — Other Ambulatory Visit: Payer: Self-pay | Admitting: *Deleted

## 2021-08-28 VITALS — BP 118/72 | HR 94

## 2021-08-28 DIAGNOSIS — O99212 Obesity complicating pregnancy, second trimester: Secondary | ICD-10-CM | POA: Diagnosis not present

## 2021-08-28 DIAGNOSIS — O09892 Supervision of other high risk pregnancies, second trimester: Secondary | ICD-10-CM | POA: Insufficient documentation

## 2021-08-28 DIAGNOSIS — O24112 Pre-existing diabetes mellitus, type 2, in pregnancy, second trimester: Secondary | ICD-10-CM

## 2021-08-28 DIAGNOSIS — Z3A27 27 weeks gestation of pregnancy: Secondary | ICD-10-CM

## 2021-08-28 DIAGNOSIS — O24312 Unspecified pre-existing diabetes mellitus in pregnancy, second trimester: Secondary | ICD-10-CM | POA: Diagnosis not present

## 2021-08-28 DIAGNOSIS — O24113 Pre-existing diabetes mellitus, type 2, in pregnancy, third trimester: Secondary | ICD-10-CM

## 2021-09-08 ENCOUNTER — Encounter: Payer: Self-pay | Admitting: Obstetrics and Gynecology

## 2021-09-08 ENCOUNTER — Ambulatory Visit (INDEPENDENT_AMBULATORY_CARE_PROVIDER_SITE_OTHER): Payer: Medicaid Other | Admitting: Obstetrics and Gynecology

## 2021-09-08 ENCOUNTER — Other Ambulatory Visit: Payer: Self-pay

## 2021-09-08 VITALS — BP 130/72 | Ht 65.0 in | Wt 339.8 lb

## 2021-09-08 DIAGNOSIS — Z3A29 29 weeks gestation of pregnancy: Secondary | ICD-10-CM

## 2021-09-08 DIAGNOSIS — O24111 Pre-existing diabetes mellitus, type 2, in pregnancy, first trimester: Secondary | ICD-10-CM

## 2021-09-08 DIAGNOSIS — Z23 Encounter for immunization: Secondary | ICD-10-CM

## 2021-09-08 DIAGNOSIS — O09892 Supervision of other high risk pregnancies, second trimester: Secondary | ICD-10-CM

## 2021-09-08 DIAGNOSIS — O24113 Pre-existing diabetes mellitus, type 2, in pregnancy, third trimester: Secondary | ICD-10-CM

## 2021-09-08 DIAGNOSIS — O24119 Pre-existing diabetes mellitus, type 2, in pregnancy, unspecified trimester: Secondary | ICD-10-CM

## 2021-09-08 DIAGNOSIS — O0993 Supervision of high risk pregnancy, unspecified, third trimester: Secondary | ICD-10-CM

## 2021-09-08 NOTE — Patient Instructions (Signed)

## 2021-09-08 NOTE — Progress Notes (Signed)
Routine Prenatal Care Visit  Subjective  Robin Houston is a 25 y.o. G3P0020 at [redacted]w[redacted]d being seen today for ongoing prenatal care.  She is currently monitored for the following issues for this high-risk pregnancy and has Symptomatic anemia; Supervision of other high risk pregnancies, second trimester; Obesity affecting pregnancy in second trimester; and Pregnancy complicated by pre-existing type 2 diabetes on their problem list.  ----------------------------------------------------------------------------------- Patient reports no complaints.   Contractions: Not present. Vag. Bleeding: None.  Movement: Present. Denies leaking of fluid.  ----------------------------------------------------------------------------------- The following portions of the patient's history were reviewed and updated as appropriate: allergies, current medications, past family history, past medical history, past social history, past surgical history and problem list. Problem list updated.   Objective  Blood pressure 130/72, height 5\' 5"  (1.651 m), weight (!) 339 lb 12.8 oz (154.1 kg), last menstrual period 11/17/2020. Pregravid weight 295 lb (133.8 kg) Total Weight Gain 44 lb 12.8 oz (20.3 kg) Urinalysis:      Fetal Status:     Movement: Present     General:  Alert, oriented and cooperative. Patient is in no acute distress.  Skin: Skin is warm and dry. No rash noted.   Cardiovascular: Normal heart rate noted  Respiratory: Normal respiratory effort, no problems with respiration noted  Abdomen: Soft, gravid, appropriate for gestational age. Pain/Pressure: Absent     Pelvic:  Cervical exam deferred        Extremities: Normal range of motion.     Mental Status: Normal mood and affect. Normal behavior. Normal judgment and thought content.     Assessment   25 y.o. G3P0020 at [redacted]w[redacted]d by  11/23/2021, by Ultrasound presenting for routine prenatal visit  Plan   May 2022 Problems (from 04/08/21 to present)      Problem Noted Resolved   Obesity affecting pregnancy in second trimester 04/15/2021 by 06/15/2021, MD No   Overview Signed 04/15/2021 11:16 AM by 06/15/2021, MD    BMI >=40 [ ]  screen sleep apnea [ ]  anesthesia consult (early and late if BMI > 45) [x ] u/s for dating [x ]  [x ] nutritional goals [x ] folic acid 1mg  [ ]  bASA (>12 weeks) [ ]  consider nutrition consult [ ]  consider maternal EKG 1st trimester [ ]  Growth u/s 28 [ ] , 32 [ ] , 36 weeks [ ]  [ ]  NST/AFI weekly 34+ weeks (34[] ,35[] ,36[] , 37[] , 38[] , 39[] , 40[] ) [ ]  IOL by 41 weeks (scheduled, prn [] )       Pregnancy complicated by pre-existing type 2 diabetes 04/15/2021 by , MD No   Overview Signed 04/15/2021 11:15 AM by , MD    Current Diabetic Medications:  Metformin  [ ]  Aspirin 81 mg daily after 12 weeks; discontinue after 36 weeks (? A2/B GDM)  For A2/B GDM or higher classes of DM [ ]  Diabetes Education and Testing Supplies [ ]  Nutrition Counsult [ ]  Fetal ECHO after 22-24 weeks  [ ]  Eye exam for retina evaluation  [ ]  Baseline EKG [ ]  fetal growth every 4 weeks starting at 28 weeks [ ]  Twice weekly NST starting at [redacted] weeks gestation [ ]  Delivery planning contingent on fetal growth, AFI, glycemic control, and other co-morbidities but at least by 39 weeks  Baseline and surveillance labs (pulled in from [facility], refresh links as needed)  Lab Results  Component Value Date   CREATININE 0.70 07/24/2020   AST 15 03/31/2020   ALT 12 03/31/2020  Lab Results  Component Value Date   HGBA1C 6.6 (H) 03/31/2020   Antenatal Testing Class of DM U/S NST/AFI DELIVERY  Diabetes   A1 - good control - O24.410    A2 - good control - O24.419      A2  - poor control or poor compliance - O24.419, E11.65   (Macrosomia or polyhydramnios) **E11.65 is extra code for poor control**    A2/B - O24.919  and B-C O24.319  Poor control B-C or D-R-F-T - O24.319  or  Type I DM - O24.019   20-38  20-38  20-24-28-32-36   20-24-28-32-35-38//fetal echo  20-24-27-30-33-36-38//fetal echo  40  32//2 x wk  32//2 x wk   32//2 x wk  28//BPP wkly then 32//2 x wk  40  39  PRN   39  PRN          Supervision of other high risk pregnancies, second trimester 04/08/2021 by Natale Milch, MD No   Overview Addendum 08/11/2021  2:50 PM by Natale Milch, MD     Nursing Staff Provider  Office Location  [facility] Dating  [place] at 6 wks  Language  English Anatomy US  MFM 19 wks complete ECHO 22 wks [normal ]  Flu Vaccine   Genetic Screen  NIPS:nml XY   TDaP vaccine    Hgb A1C or  GTT GDMB   Covid vaccinated   LAB RESULTS   Rhogam  n/a Blood Type A/Positive/-- (05/04 1445)   Feeding Plan breast Antibody Negative (05/04 1445)  Contraception  Rubella 1.03 (05/04 1445)  Circumcision  RPR Non Reactive (05/04 1445)   Pediatrician   HBsAg Negative (05/04 1445)   Support Person Husband HIV Non Reactive (05/04 1445)  Prenatal Classes  discussed Varicella Immune    GBS  (For PCN allergy, check sensitivities)   BTL Consent     VBAC Consent n/a Pap  04/08/2021    Hgb Electro         Obesity in pregnancy: pregravid BMI: 49 Type 2 diabetes in pregnancy 07/10/2021 increased to:   Lantus 20 units Novolog 6 units with meals 08/11/2021 increased to: Lantus 20 units Novolog 6 units with breakfast, lunch 8 units with dinner          TDAP and flu shots today 28 week labs drawn Blood glucose log reviewed- all but 6 values WENL- continue current regimen.   Gestational age appropriate obstetric precautions including but not limited to vaginal bleeding, contractions, leaking of fluid and fetal movement were reviewed in detail with the patient.    No follow-ups on file.  Natale Milch MD [facility], [facility] 09/08/2021, 4:29 PM

## 2021-09-09 LAB — CBC
Hematocrit: 35.3 % (ref 34.0–46.6)
Hemoglobin: 11.2 g/dL (ref 11.1–15.9)
MCH: 25.1 pg — ABNORMAL LOW (ref 26.6–33.0)
MCHC: 31.7 g/dL (ref 31.5–35.7)
MCV: 79 fL (ref 79–97)
Platelets: 323 10*3/uL (ref 150–450)
RBC: 4.47 x10E6/uL (ref 3.77–5.28)
RDW: 14.6 % (ref 11.7–15.4)
WBC: 10.8 10*3/uL (ref 3.4–10.8)

## 2021-09-09 LAB — RPR: RPR Ser Ql: NONREACTIVE

## 2021-09-09 LAB — HEMOGLOBIN A1C
Est. average glucose Bld gHb Est-mCnc: 151 mg/dL
Hgb A1c MFr Bld: 6.9 % — ABNORMAL HIGH (ref 4.8–5.6)

## 2021-09-09 LAB — HIV ANTIBODY (ROUTINE TESTING W REFLEX): HIV Screen 4th Generation wRfx: NONREACTIVE

## 2021-09-20 LAB — COMPREHENSIVE METABOLIC PANEL

## 2021-09-20 LAB — HGB FRACTIONATION CASCADE
Hgb A2: 2.3 % (ref 1.8–3.2)
Hgb A: 97.7 % (ref 96.4–98.8)
Hgb F: 0 % (ref 0.0–2.0)
Hgb S: 0 %

## 2021-09-20 LAB — SPECIMEN STATUS REPORT

## 2021-09-23 ENCOUNTER — Other Ambulatory Visit: Payer: Self-pay

## 2021-09-23 ENCOUNTER — Other Ambulatory Visit: Payer: Medicaid Other

## 2021-09-23 ENCOUNTER — Encounter: Payer: Medicaid Other | Admitting: Obstetrics and Gynecology

## 2021-10-01 ENCOUNTER — Encounter: Payer: Self-pay | Admitting: Obstetrics and Gynecology

## 2021-10-01 ENCOUNTER — Other Ambulatory Visit: Payer: Self-pay

## 2021-10-01 ENCOUNTER — Ambulatory Visit (INDEPENDENT_AMBULATORY_CARE_PROVIDER_SITE_OTHER): Payer: Medicaid Other | Admitting: Obstetrics and Gynecology

## 2021-10-01 VITALS — BP 134/74 | Wt 345.0 lb

## 2021-10-01 DIAGNOSIS — O09893 Supervision of other high risk pregnancies, third trimester: Secondary | ICD-10-CM | POA: Diagnosis not present

## 2021-10-01 DIAGNOSIS — Z3A32 32 weeks gestation of pregnancy: Secondary | ICD-10-CM

## 2021-10-01 DIAGNOSIS — O09892 Supervision of other high risk pregnancies, second trimester: Secondary | ICD-10-CM

## 2021-10-01 DIAGNOSIS — O24111 Pre-existing diabetes mellitus, type 2, in pregnancy, first trimester: Secondary | ICD-10-CM

## 2021-10-01 LAB — FETAL NONSTRESS TEST

## 2021-10-01 LAB — POCT URINALYSIS DIPSTICK OB: Glucose, UA: NEGATIVE

## 2021-10-01 MED ORDER — INSULIN GLARGINE 100 UNIT/ML SOLOSTAR PEN: 25.0000 [IU] | PEN_INJECTOR | Freq: Every day | SUBCUTANEOUS | 11 refills | Status: DC

## 2021-10-01 MED ORDER — INSULIN ASPART 100 UNIT/ML FLEXPEN: PEN_INJECTOR | SUBCUTANEOUS | 11 refills | Status: DC

## 2021-10-01 NOTE — Patient Instructions (Signed)
Diabetes Mellitus and Nutrition, Adult When you have diabetes, or diabetes mellitus, it is very important to have healthy eating habits because your blood sugar (glucose) levels are greatly affected by what you eat and drink. Eating healthy foods in the right amounts, at about the same times every day, can help you:  Control your blood glucose.  Lower your risk of heart disease.  Improve your blood pressure.  Reach or maintain a healthy weight. What can affect my meal plan? Every person with diabetes is different, and each person has different needs for a meal plan. Your health care provider may recommend that you work with a dietitian to make a meal plan that is best for you. Your meal plan may vary depending on factors such as:  The calories you need.  The medicines you take.  Your weight.  Your blood glucose, blood pressure, and cholesterol levels.  Your activity level.  Other health conditions you have, such as heart or kidney disease. How do carbohydrates affect me? Carbohydrates, also called carbs, affect your blood glucose level more than any other type of food. Eating carbs naturally raises the amount of glucose in your blood. Carb counting is a method for keeping track of how many carbs you eat. Counting carbs is important to keep your blood glucose at a healthy level, especially if you use insulin or take certain oral diabetes medicines. It is important to know how many carbs you can safely have in each meal. This is different for every person. Your dietitian can help you calculate how many carbs you should have at each meal and for each snack. How does alcohol affect me? Alcohol can cause a sudden decrease in blood glucose (hypoglycemia), especially if you use insulin or take certain oral diabetes medicines. Hypoglycemia can be a life-threatening condition. Symptoms of hypoglycemia, such as sleepiness, dizziness, and confusion, are similar to symptoms of having too much  alcohol.  Do not drink alcohol if: ? Your health care provider tells you not to drink. ? You are pregnant, may be pregnant, or are planning to become pregnant.  If you drink alcohol: ? Do not drink on an empty stomach. ? Limit how much you use to:  0-1 drink a day for women.  0-2 drinks a day for men. ? Be aware of how much alcohol is in your drink. In the [place] one drink equals one 12 oz bottle of beer (355 mL), one 5 oz glass of wine (148 mL), or one 1 oz glass of hard liquor (44 mL). ? Keep yourself hydrated with water, diet soda, or unsweetened iced tea.  Keep in mind that regular soda, juice, and other mixers may contain a lot of sugar and must be counted as carbs. What are tips for following this plan? Reading food labels  Start by checking the serving size on the "Nutrition Facts" label of packaged foods and drinks. The amount of calories, carbs, fats, and other nutrients listed on the label is based on one serving of the item. Many items contain more than one serving per package.  Check the total grams (g) of carbs in one serving. You can calculate the number of servings of carbs in one serving by dividing the total carbs by 15. For example, if a food has 30 g of total carbs per serving, it would be equal to 2 servings of carbs.  Check the number of grams (g) of saturated fats and trans fats in one serving. Choose foods that have   a low amount or none of these fats.  Check the number of milligrams (mg) of salt (sodium) in one serving. Most people should limit total sodium intake to less than 2,300 mg per day.  Always check the nutrition information of foods labeled as "low-fat" or "nonfat." These foods may be higher in added sugar or refined carbs and should be avoided.  Talk to your dietitian to identify your daily goals for nutrients listed on the label. Shopping  Avoid buying canned, pre-made, or processed foods. These foods tend to be high in fat, sodium, and added  sugar.  Shop around the outside edge of the grocery store. This is where you will most often find fresh fruits and vegetables, bulk grains, fresh meats, and fresh dairy. Cooking  Use low-heat cooking methods, such as baking, instead of high-heat cooking methods like deep frying.  Cook using healthy oils, such as olive, canola, or sunflower oil.  Avoid cooking with butter, cream, or high-fat meats. Meal planning  Eat meals and snacks regularly, preferably at the same times every day. Avoid going long periods of time without eating.  Eat foods that are high in fiber, such as fresh fruits, vegetables, beans, and whole grains. Talk with your dietitian about how many servings of carbs you can eat at each meal.  Eat 4-6 oz (112-168 g) of lean protein each day, such as lean meat, chicken, fish, eggs, or tofu. One ounce (oz) of lean protein is equal to: ? 1 oz (28 g) of meat, chicken, or fish. ? 1 egg. ?  cup (62 g) of tofu.  Eat some foods each day that contain healthy fats, such as avocado, nuts, seeds, and fish.   What foods should I eat? Fruits Berries. Apples. Oranges. Peaches. Apricots. Plums. Grapes. Mango. Papaya. Pomegranate. Kiwi. Cherries. Vegetables Lettuce. Spinach. Leafy greens, including kale, chard, collard greens, and mustard greens. Beets. Cauliflower. Cabbage. Broccoli. Carrots. Green beans. Tomatoes. Peppers. Onions. Cucumbers. Brussels sprouts. Grains Whole grains, such as whole-wheat or whole-grain bread, crackers, tortillas, cereal, and pasta. Unsweetened oatmeal. Quinoa. Brown or wild rice. Meats and other proteins Seafood. Poultry without skin. Lean cuts of poultry and beef. Tofu. Nuts. Seeds. Dairy Low-fat or fat-free dairy products such as milk, yogurt, and cheese. The items listed above may not be a complete list of foods and beverages you can eat. Contact a dietitian for more information. What foods should I avoid? Fruits Fruits canned with  syrup. Vegetables Canned vegetables. Frozen vegetables with butter or cream sauce. Grains Refined white flour and flour products such as bread, pasta, snack foods, and cereals. Avoid all processed foods. Meats and other proteins Fatty cuts of meat. Poultry with skin. Breaded or fried meats. Processed meat. Avoid saturated fats. Dairy Full-fat yogurt, cheese, or milk. Beverages Sweetened drinks, such as soda or iced tea. The items listed above may not be a complete list of foods and beverages you should avoid. Contact a dietitian for more information. Questions to ask a health care provider  Do I need to meet with a diabetes educator?  Do I need to meet with a dietitian?  What number can I call if I have questions?  When are the best times to check my blood glucose? Where to find more information:  American Diabetes Association: diabetes.org  Academy of Nutrition and Dietetics: www.eatright.org  National Institute of Diabetes and Digestive and Kidney Diseases: www.niddk.nih.gov  Association of Diabetes Care and Education Specialists: www.diabeteseducator.org Summary  It is important to have healthy eating   habits because your blood sugar (glucose) levels are greatly affected by what you eat and drink.  A healthy meal plan will help you control your blood glucose and maintain a healthy lifestyle.  Your health care provider may recommend that you work with a dietitian to make a meal plan that is best for you.  Keep in mind that carbohydrates (carbs) and alcohol have immediate effects on your blood glucose levels. It is important to count carbs and to use alcohol carefully. This information is not intended to replace advice given to you by your health care provider. Make sure you discuss any questions you have with your health care provider. Document Revised: 10/30/2019 Document Reviewed: 10/30/2019 Elsevier Patient Education  2021 [employer]  

## 2021-10-01 NOTE — Progress Notes (Signed)
Routine Prenatal Care Visit  Subjective  Robin Houston is a 25 y.o. G3P0020 at [redacted]w[redacted]d being seen today for ongoing prenatal care.  She is currently monitored for the following issues for this high-risk pregnancy and has Symptomatic anemia; Supervision of other high risk pregnancies, second trimester; Obesity affecting pregnancy in second trimester; and Pregnancy complicated by pre-existing type 2 diabetes on their problem list.  ----------------------------------------------------------------------------------- Patient reports no complaints.   Contractions: Not present. Vag. Bleeding: None.  Movement: Present. Denies leaking of fluid.  ----------------------------------------------------------------------------------- The following portions of the patient's history were reviewed and updated as appropriate: allergies, current medications, past family history, past medical history, past social history, past surgical history and problem list. Problem list updated.   Objective  Blood pressure 134/74, weight (!) 345 lb (156.5 kg), last menstrual period 11/17/2020. Pregravid weight 295 lb (133.8 kg) Total Weight Gain 50 lb (22.7 kg) Urinalysis:      Fetal Status:     Movement: Present     General:  Alert, oriented and cooperative. Patient is in no acute distress.  Skin: Skin is warm and dry. No rash noted.   Cardiovascular: Normal heart rate noted  Respiratory: Normal respiratory effort, no problems with respiration noted  Abdomen: Soft, gravid, appropriate for gestational age. Pain/Pressure: Absent     Pelvic:  Cervical exam deferred        Extremities: Normal range of motion.     Mental Status: Normal mood and affect. Normal behavior. Normal judgment and thought content.     Assessment   25 y.o. G3P0020 at [redacted]w[redacted]d by  11/23/2021, by Ultrasound presenting for routine prenatal visit  Plan   May 2022 Problems (from 04/08/21 to present)     Problem Noted Resolved   Obesity affecting  pregnancy in second trimester 04/15/2021 by Nadara Mustard, MD No   Overview Signed 04/15/2021 11:16 AM by Nadara Mustard, MD    BMI >=40 [ ]  screen sleep apnea [ ]  anesthesia consult (early and late if BMI > 45) [x ] u/s for dating [x ]  [x ] nutritional goals [x ] folic acid 1mg  [ ]  bASA (>12 weeks) [ ]  consider nutrition consult [ ]  consider maternal EKG 1st trimester [ ]  Growth u/s 28 [ ] , 32 [ ] , 36 weeks [ ]  [ ]  NST/AFI weekly 34+ weeks (34[] ,35[] ,36[] , 37[] , 38[] , 39[] , 40[] ) [ ]  IOL by 41 weeks (scheduled, prn [] )       Pregnancy complicated by pre-existing type 2 diabetes 04/15/2021 by , MD No   Overview Signed 04/15/2021 11:15 AM by , MD    Current Diabetic Medications:  Metformin  [ ]  Aspirin 81 mg daily after 12 weeks; discontinue after 36 weeks (? A2/B GDM)  For A2/B GDM or higher classes of DM [ ]  Diabetes Education and Testing Supplies [ ]  Nutrition Counsult [ ]  Fetal ECHO after 22-24 weeks  [ ]  Eye exam for retina evaluation  [ ]  Baseline EKG [ ]  fetal growth every 4 weeks starting at 28 weeks [ ]  Twice weekly NST starting at [redacted] weeks gestation [ ]  Delivery planning contingent on fetal growth, AFI, glycemic control, and other co-morbidities but at least by 39 weeks  Baseline and surveillance labs (pulled in from [facility], refresh links as needed)  Lab Results  Component Value Date   CREATININE 0.70 07/24/2020   AST 15 03/31/2020   ALT 12 03/31/2020   Lab Results  Component Value Date  HGBA1C 6.6 (H) 03/31/2020   Antenatal Testing Class of DM U/S NST/AFI DELIVERY  Diabetes   A1 - good control - O24.410    A2 - good control - O24.419      A2  - poor control or poor compliance - O24.419, E11.65   (Macrosomia or polyhydramnios) **E11.65 is extra code for poor control**    A2/B - O24.919  and B-C O24.319  Poor control B-C or D-R-F-T - O24.319  or  Type I DM - O24.019   20-38  20-38  20-24-28-32-36   20-24-28-32-35-38//fetal echo  20-24-27-30-33-36-38//fetal echo  40  32//2 x wk  32//2 x wk   32//2 x wk  28//BPP wkly then 32//2 x wk  40  39  PRN   39  PRN          Supervision of other high risk pregnancies, second trimester 04/08/2021 by Natale Milch, MD No   Overview Addendum 09/08/2021  4:40 PM by Natale Milch, MD     Nursing Staff Provider  Office Location  [facility] Dating  [place] at 6 wks  Language  English Anatomy US  MFM 19 wks complete ECHO 22 wks [normal ]  Flu Vaccine  09/08/2021 Genetic Screen  NIPS:nml XY   TDaP vaccine   09/08/2021 Hgb A1C or  GTT GDMB   Covid vaccinated   LAB RESULTS   Rhogam  n/a Blood Type A/Positive/-- (05/04 1445)   Feeding Plan breast Antibody Negative (05/04 1445)  Contraception  Rubella 1.03 (05/04 1445)  Circumcision  RPR Non Reactive (05/04 1445)   Pediatrician   HBsAg Negative (05/04 1445)   Support Person Husband HIV Non Reactive (05/04 1445)  Prenatal Classes  discussed Varicella Immune    GBS  (For PCN allergy, check sensitivities)   BTL Consent     VBAC Consent n/a Pap  04/08/2021    Hgb Electro         Obesity in pregnancy: pregravid BMI: 49 Type 2 diabetes in pregnancy 07/10/2021 increased to:   Lantus 20 units Novolog 6 units with meals 08/11/2021 increased to: Lantus 20 units Novolog 6 units with breakfast, lunch 8 units with dinner                 Increased insulin regimen to 25 units Lantus, NovoLog 6 units with breakfast, NovoLog 10 units with lunch and dinner.  NST: 140 bpm baseline, moderate variability, 15 x 15 accelerations, no decelerations. Reactive  Encouraged to maintain weight, reviewed weight gain recommendations, she has gained 50 lbs.     Gestational age appropriate obstetric precautions including but not limited to vaginal bleeding, contractions, leaking of fluid and fetal movement were reviewed in detail with the patient.     Return in about 1 week (around 10/08/2021) for Weekly ROB/NST with md x 5 visits.  Natale Milch MD [facility], [facility] 10/01/2021, 4:19 PM

## 2021-10-02 ENCOUNTER — Ambulatory Visit: Payer: Medicaid Other | Attending: Obstetrics and Gynecology

## 2021-10-02 ENCOUNTER — Encounter: Payer: Self-pay | Admitting: *Deleted

## 2021-10-02 ENCOUNTER — Ambulatory Visit: Payer: Medicaid Other | Admitting: *Deleted

## 2021-10-02 ENCOUNTER — Other Ambulatory Visit: Payer: Self-pay | Admitting: *Deleted

## 2021-10-02 ENCOUNTER — Other Ambulatory Visit: Payer: Self-pay

## 2021-10-02 VITALS — BP 132/74 | HR 96

## 2021-10-02 DIAGNOSIS — O99212 Obesity complicating pregnancy, second trimester: Secondary | ICD-10-CM | POA: Insufficient documentation

## 2021-10-02 DIAGNOSIS — Z3A32 32 weeks gestation of pregnancy: Secondary | ICD-10-CM | POA: Diagnosis not present

## 2021-10-02 DIAGNOSIS — O09892 Supervision of other high risk pregnancies, second trimester: Secondary | ICD-10-CM | POA: Insufficient documentation

## 2021-10-02 DIAGNOSIS — E119 Type 2 diabetes mellitus without complications: Secondary | ICD-10-CM

## 2021-10-02 DIAGNOSIS — O24113 Pre-existing diabetes mellitus, type 2, in pregnancy, third trimester: Secondary | ICD-10-CM | POA: Insufficient documentation

## 2021-10-02 DIAGNOSIS — O99213 Obesity complicating pregnancy, third trimester: Secondary | ICD-10-CM

## 2021-10-07 ENCOUNTER — Encounter: Payer: Medicaid Other | Admitting: Obstetrics and Gynecology

## 2021-10-09 ENCOUNTER — Other Ambulatory Visit: Payer: Self-pay

## 2021-10-09 ENCOUNTER — Ambulatory Visit: Payer: Medicaid Other | Admitting: *Deleted

## 2021-10-09 ENCOUNTER — Encounter: Payer: Self-pay | Admitting: *Deleted

## 2021-10-09 ENCOUNTER — Ambulatory Visit: Payer: Medicaid Other | Attending: Obstetrics and Gynecology

## 2021-10-09 ENCOUNTER — Ambulatory Visit (INDEPENDENT_AMBULATORY_CARE_PROVIDER_SITE_OTHER): Payer: Medicaid Other | Admitting: Obstetrics and Gynecology

## 2021-10-09 VITALS — BP 119/75

## 2021-10-09 VITALS — BP 142/82 | HR 100

## 2021-10-09 VITALS — BP 138/80 | Wt 353.0 lb

## 2021-10-09 DIAGNOSIS — O99212 Obesity complicating pregnancy, second trimester: Secondary | ICD-10-CM

## 2021-10-09 DIAGNOSIS — E669 Obesity, unspecified: Secondary | ICD-10-CM | POA: Diagnosis not present

## 2021-10-09 DIAGNOSIS — O24113 Pre-existing diabetes mellitus, type 2, in pregnancy, third trimester: Secondary | ICD-10-CM

## 2021-10-09 DIAGNOSIS — O99213 Obesity complicating pregnancy, third trimester: Secondary | ICD-10-CM | POA: Diagnosis not present

## 2021-10-09 DIAGNOSIS — O09892 Supervision of other high risk pregnancies, second trimester: Secondary | ICD-10-CM

## 2021-10-09 DIAGNOSIS — Z3A33 33 weeks gestation of pregnancy: Secondary | ICD-10-CM

## 2021-10-09 DIAGNOSIS — E119 Type 2 diabetes mellitus without complications: Secondary | ICD-10-CM

## 2021-10-09 LAB — FETAL NONSTRESS TEST

## 2021-10-09 LAB — POCT URINALYSIS DIPSTICK OB
Glucose, UA: NEGATIVE
POC,PROTEIN,UA: NEGATIVE

## 2021-10-09 NOTE — Progress Notes (Signed)
Routine Prenatal Care Visit  Subjective  Robin Houston is a 25 y.o. G3P0020 at [redacted]w[redacted]d being seen today for ongoing prenatal care.  She is currently monitored for the following issues for this low-risk pregnancy and has Symptomatic anemia; Supervision of other high risk pregnancies, second trimester; Obesity affecting pregnancy in second trimester; and Pregnancy complicated by pre-existing type 2 diabetes on their problem list.  ----------------------------------------------------------------------------------- Patient reports no complaints.    .  .   . Denies leaking of fluid.  ----------------------------------------------------------------------------------- The following portions of the patient's history were reviewed and updated as appropriate: allergies, current medications, past family history, past medical history, past social history, past surgical history and problem list. Problem list updated.   Objective  Blood pressure 138/80, weight (!) 353 lb (160.1 kg), last menstrual period 11/17/2020. Pregravid weight 295 lb (133.8 kg) Total Weight Gain 58 lb (26.3 kg) Urinalysis:      Fetal Status:           General:  Alert, oriented and cooperative. Patient is in no acute distress.  Skin: Skin is warm and dry. No rash noted.   Cardiovascular: Normal heart rate noted  Respiratory: Normal respiratory effort, no problems with respiration noted  Abdomen: Soft, gravid, appropriate for gestational age.       Pelvic:  Cervical exam deferred        Extremities: Normal range of motion.     ental Status: Normal mood and affect. Normal behavior. Normal judgment and thought content.   Baseline: 135 Variability: moderate Accelerations: present Decelerations: absent Tocometry: none The patient was monitored for 30 minutes, fetal heart rate tracing was deemed reactive, category I tracing,  CPT [phone]   Assessment   25 y.o. G3P0020 at [redacted]w[redacted]d by  11/23/2021, by Ultrasound presenting for  routine prenatal visit  Plan   May 2022 Problems (from 04/08/21 to present)     Problem Noted Resolved   Obesity affecting pregnancy in second trimester 04/15/2021 by Nadara Mustard, MD No   Overview Signed 04/15/2021 11:16 AM by Nadara Mustard, MD    BMI >=40 [ ]  screen sleep apnea [ ]  anesthesia consult (early and late if BMI > 45) [x ] u/s for dating [x ]  [x ] nutritional goals [x ] folic acid 1mg  [ ]  bASA (>12 weeks) [ ]  consider nutrition consult [ ]  consider maternal EKG 1st trimester [ ]  Growth u/s 28 [ ] , 32 [ ] , 36 weeks [ ]  [ ]  NST/AFI weekly 34+ weeks (34[] ,35[] ,36[] , 37[] , 38[] , 39[] , 40[] ) [ ]  IOL by 41 weeks (scheduled, prn [] )       Pregnancy complicated by pre-existing type 2 diabetes 04/15/2021 by , MD No   Overview Signed 04/15/2021 11:15 AM by , MD    Current Diabetic Medications:  Metformin  [ ]  Aspirin 81 mg daily after 12 weeks; discontinue after 36 weeks (? A2/B GDM)  For A2/B GDM or higher classes of DM [ ]  Diabetes Education and Testing Supplies [ ]  Nutrition Counsult [ ]  Fetal ECHO after 22-24 weeks  [ ]  Eye exam for retina evaluation  [ ]  Baseline EKG [ ]  fetal growth every 4 weeks starting at 28 weeks [ ]  Twice weekly NST starting at [redacted] weeks gestation [ ]  Delivery planning contingent on fetal growth, AFI, glycemic control, and other co-morbidities but at least by 39 weeks  Baseline and surveillance labs (pulled in from [facility], refresh links as needed)  Lab Results  Component Value Date   CREATININE 0.70 07/24/2020   AST 15 03/31/2020   ALT 12 03/31/2020   Lab Results  Component Value Date   HGBA1C 6.6 (H) 03/31/2020   Antenatal Testing Class of DM U/S NST/AFI DELIVERY  Diabetes   A1 - good control - O24.410    A2 - good control - O24.419      A2  - poor control or poor compliance - O24.419, E11.65   (Macrosomia or polyhydramnios) **E11.65 is extra code for poor control**    A2/B - O24.919  and  B-C O24.319  Poor control B-C or D-R-F-T - O24.319  or  Type I DM - O24.019  20-38  20-38  20-24-28-32-36   20-24-28-32-35-38//fetal echo  20-24-27-30-33-36-38//fetal echo  40  32//2 x wk  32//2 x wk   32//2 x wk  28//BPP wkly then 32//2 x wk  40  39  PRN   39  PRN          Supervision of other high risk pregnancies, second trimester 04/08/2021 by Natale Milch, MD No   Overview Addendum 10/01/2021  4:20 PM by Natale Milch, MD     Nursing Staff Provider  Office Location  [facility] Dating  [place] at 6 wks  Language  English Anatomy US  MFM 19 wks complete ECHO 22 wks [normal ]  Flu Vaccine  09/08/2021 Genetic Screen  NIPS:nml XY   TDaP vaccine   09/08/2021 Hgb A1C or  GTT GDMB   Covid vaccinated   LAB RESULTS   Rhogam  n/a Blood Type A/Positive/-- (05/04 1445)   Feeding Plan breast Antibody Negative (05/04 1445)  Contraception POP Rubella 1.03 (05/04 1445)  Circumcision  RPR Non Reactive (05/04 1445)   Pediatrician   HBsAg Negative (05/04 1445)   Support Person Husband HIV Non Reactive (05/04 1445)  Prenatal Classes  discussed Varicella Immune    GBS  (For PCN allergy, check sensitivities)   BTL Consent     VBAC Consent n/a Pap  04/08/2021    Hgb Electro         Obesity in pregnancy: pregravid BMI: 49 Type 2 diabetes in pregnancy 07/10/2021 increased to:   Lantus 20 units Novolog 6 units with meals 08/11/2021 increased to: Lantus 20 units Novolog 6 units with breakfast, lunch 8 units with dinner 10/01/2021 increased to: Lantus 25 units NovoLog 6 units with breakfast, 10 units with lunch and dinner           Gestational age appropriate obstetric precautions including but not limited to vaginal bleeding, contractions, leaking of fluid and fetal movement were reviewed in detail with the patient.    - NST reactive - BG log reviewed an values remain in goal no adjustments to insulin regimen  Return in about 4 days (around 10/13/2021) for ROB,  NST 11/8 amd 11/11.  Vena Austria, MD, [name], [facility] 10/09/2021, 8:49 AM

## 2021-10-09 NOTE — Progress Notes (Signed)
ROB - no concerns. RM 6 

## 2021-10-13 ENCOUNTER — Other Ambulatory Visit: Payer: Self-pay

## 2021-10-13 ENCOUNTER — Ambulatory Visit (INDEPENDENT_AMBULATORY_CARE_PROVIDER_SITE_OTHER): Payer: Medicaid Other | Admitting: Advanced Practice Midwife

## 2021-10-13 VITALS — BP 140/82 | Ht 65.0 in | Wt 350.2 lb

## 2021-10-13 DIAGNOSIS — O0993 Supervision of high risk pregnancy, unspecified, third trimester: Secondary | ICD-10-CM

## 2021-10-13 DIAGNOSIS — Z3A34 34 weeks gestation of pregnancy: Secondary | ICD-10-CM | POA: Diagnosis not present

## 2021-10-13 DIAGNOSIS — O24113 Pre-existing diabetes mellitus, type 2, in pregnancy, third trimester: Secondary | ICD-10-CM | POA: Diagnosis not present

## 2021-10-13 DIAGNOSIS — O163 Unspecified maternal hypertension, third trimester: Secondary | ICD-10-CM

## 2021-10-13 DIAGNOSIS — O99213 Obesity complicating pregnancy, third trimester: Secondary | ICD-10-CM

## 2021-10-13 LAB — POCT URINALYSIS DIPSTICK OB: Glucose, UA: NEGATIVE

## 2021-10-13 LAB — FETAL NONSTRESS TEST

## 2021-10-13 NOTE — Progress Notes (Signed)
Routine Prenatal Care Visit  Subjective  Robin Houston is a 25 y.o. G3P0020 at [redacted]w[redacted]d being seen today for ongoing prenatal care.  She is currently monitored for the following issues for this high-risk pregnancy and has Symptomatic anemia; Supervision of high-risk pregnancy; Obesity affecting pregnancy in second trimester; and Pregnancy complicated by pre-existing type 2 diabetes on their problem list.  ----------------------------------------------------------------------------------- Patient reports no complaints.  She denies headache, visual changes or epigastric pain. Contractions: Not present. Vag. Bleeding: None.  Movement: Present. Leaking Fluid denies.  ----------------------------------------------------------------------------------- The following portions of the patient's history were reviewed and updated as appropriate: allergies, current medications, past family history, past medical history, past social history, past surgical history and problem list. Problem list updated.  Objective  Blood pressure 140/82, height 5\' 5"  (1.651 m), weight (!) 350 lb 3.2 oz (158.8 kg), last menstrual period 11/17/2020. Pregravid weight 295 lb (133.8 kg) Total Weight Gain 55 lb 3.2 oz (25 kg) Urinalysis: Urine Protein    Urine Glucose    Fetal Status: Fetal Heart Rate (bpm): 140   Movement: Present      NST: reactive 30 minute tracing, 140 bpm, moderate variability, +accelerations, -decelerations (some difficulty with tracing and overall reassuring)  BS Log: all values are within normal range since last visit, no adjustment of insulin  General:  Alert, oriented and cooperative. Patient is in no acute distress.  Skin: Skin is warm and dry. No rash noted.   Cardiovascular: Normal heart rate noted  Respiratory: Normal respiratory effort, no problems with respiration noted  Abdomen: Soft, gravid, appropriate for gestational age. Pain/Pressure: Absent     Pelvic:  Cervical exam deferred         Extremities: Normal range of motion.     Mental Status: Normal mood and affect. Normal behavior. Normal judgment and thought content.   Assessment   25 y.o. G3P0020 at [redacted]w[redacted]d by  11/23/2021, by Ultrasound presenting for routine prenatal visit  Plan   May 2022 Problems (from 04/08/21 to present)    Problem Noted Resolved   Obesity affecting pregnancy in second trimester 04/15/2021 by Gae Dry, MD No   Overview Signed 04/15/2021 11:16 AM by Gae Dry, MD    BMI >=40 [ ]  screen sleep apnea [ ]  anesthesia consult (early and late if BMI > 45) [x ] u/s for dating [x ]  [x ] nutritional goals [x ] folic acid 1mg  [ ]  bASA (>12 weeks) [ ]  consider nutrition consult [ ]  consider maternal EKG 1st trimester [ ]  Growth u/s 28 [ ] , 32 [ ] , 36 weeks [ ]  [ ]  NST/AFI weekly 34+ weeks (34[] ,35[] ,36[] , 37[] , 38[] , 39[] , 40[] ) [ ]  IOL by 41 weeks (scheduled, prn [] )       Pregnancy complicated by pre-existing type 2 diabetes 04/15/2021 by Gae Dry, MD No   Overview Signed 04/15/2021 11:15 AM by Gae Dry, MD    Current Diabetic Medications:  Metformin  [ ]  Aspirin 81 mg daily after 12 weeks; discontinue after 36 weeks (? A2/B GDM)  For A2/B GDM or higher classes of DM [ ]  Diabetes Education and Testing Supplies [ ]  Nutrition Counsult [ ]  Fetal ECHO after 22-24 weeks  [ ]  Eye exam for retina evaluation  [ ]  Baseline EKG [ ]  US fetal growth every 4 weeks starting at 28 weeks [ ]  Twice weekly NST starting at [redacted] weeks gestation [ ]  Delivery planning contingent on fetal growth, AFI, glycemic control, and other co-morbidities but  at least by 39 weeks  Baseline and surveillance labs (pulled in from [facility], refresh links as needed)  Lab Results  Component Value Date   CREATININE 0.70 07/24/2020   AST 15 03/31/2020   ALT 12 03/31/2020   Lab Results  Component Value Date   HGBA1C 6.6 (H) 03/31/2020    Antenatal Testing Class of DM U/S NST/AFI DELIVERY  Diabetes    A1 - good control - O24.410    A2 - good control - O24.419      A2  - poor control or poor compliance - O24.419, E11.65   (Macrosomia or polyhydramnios) **E11.65 is extra code for poor control**    A2/B - O24.919  and B-C O24.319  Poor control B-C or D-R-F-T - O24.319  or  Type I DM - O24.019  20-38  20-38  20-24-28-32-36   20-24-28-32-35-38//fetal echo  20-24-27-30-33-36-38//fetal echo  40  32//2 x wk  32//2 x wk   32//2 x wk  28//BPP wkly then 32//2 x wk  40  39  PRN   39  PRN          Supervision of high-risk pregnancy 04/08/2021 by Natale Milch, MD No   Overview Addendum 10/01/2021  4:20 PM by Natale Milch, MD     Nursing Staff Provider  Office Location  [facility] Dating  [place] at 6 wks  Language  English Anatomy US  MFM 19 wks complete ECHO 22 wks [normal ]  Flu Vaccine  09/08/2021 Genetic Screen  NIPS:nml XY   TDaP vaccine   09/08/2021 Hgb A1C or  GTT GDMB   Covid vaccinated   LAB RESULTS   Rhogam  n/a Blood Type A/Positive/-- (05/04 1445)   Feeding Plan breast Antibody Negative (05/04 1445)  Contraception POP Rubella 1.03 (05/04 1445)  Circumcision  RPR Non Reactive (05/04 1445)   Pediatrician   HBsAg Negative (05/04 1445)   Support Person Husband HIV Non Reactive (05/04 1445)  Prenatal Classes  discussed Varicella Immune    GBS  (For PCN allergy, check sensitivities)   BTL Consent     VBAC Consent n/a Pap  04/08/2021    Hgb Electro          Obesity in pregnancy: pregravid BMI: 49 Type 2 diabetes in pregnancy 07/10/2021 increased to:   Lantus 20 units Novolog 6 units with meals 08/11/2021 increased to: Lantus 20 units Novolog 6 units with breakfast, lunch 8 units with dinner 10/01/2021 increased to: Lantus 25 units NovoLog 6 units with breakfast, 10 units with lunch and dinner       PIH labs today due to elevated BP   Preterm labor symptoms and general obstetric precautions including but not limited to vaginal bleeding,  contractions, leaking of fluid and fetal movement were reviewed in detail with the patient.   Return for scheduled visits.  Tresea Mall, CNM 10/13/2021 9:15 AM

## 2021-10-14 ENCOUNTER — Other Ambulatory Visit: Payer: Medicaid Other

## 2021-10-14 LAB — CBC
Hematocrit: 34.1 % (ref 34.0–46.6)
Hemoglobin: 10.9 g/dL — ABNORMAL LOW (ref 11.1–15.9)
MCH: 24.9 pg — ABNORMAL LOW (ref 26.6–33.0)
MCHC: 32 g/dL (ref 31.5–35.7)
MCV: 78 fL — ABNORMAL LOW (ref 79–97)
Platelets: 295 10*3/uL (ref 150–450)
RBC: 4.38 x10E6/uL (ref 3.77–5.28)
RDW: 14.5 % (ref 11.7–15.4)
WBC: 8.8 10*3/uL (ref 3.4–10.8)

## 2021-10-14 LAB — COMPREHENSIVE METABOLIC PANEL
ALT: 13 IU/L (ref 0–32)
AST: 13 IU/L (ref 0–40)
Albumin/Globulin Ratio: 1 — ABNORMAL LOW (ref 1.2–2.2)
Albumin: 3.3 g/dL — ABNORMAL LOW (ref 3.9–5.0)
Alkaline Phosphatase: 110 IU/L (ref 44–121)
BUN/Creatinine Ratio: 12 (ref 9–23)
BUN: 6 mg/dL (ref 6–20)
Bilirubin Total: 0.2 mg/dL (ref 0.0–1.2)
CO2: 23 mmol/L (ref 20–29)
Calcium: 9.3 mg/dL (ref 8.7–10.2)
Chloride: 100 mmol/L (ref 96–106)
Creatinine, Ser: 0.51 mg/dL — ABNORMAL LOW (ref 0.57–1.00)
Globulin, Total: 3.2 g/dL (ref 1.5–4.5)
Glucose: 91 mg/dL (ref 70–99)
Potassium: 4.4 mmol/L (ref 3.5–5.2)
Sodium: 137 mmol/L (ref 134–144)
Total Protein: 6.5 g/dL (ref 6.0–8.5)
eGFR: 132 mL/min/{1.73_m2} (ref >59)

## 2021-10-15 ENCOUNTER — Observation Stay
Admission: EM | Admit: 2021-10-15 | Discharge: 2021-10-15 | Disposition: A | Discharge status: Home / Self Care | Payer: Medicaid Other | Attending: Obstetrics and Gynecology | Admitting: Obstetrics and Gynecology

## 2021-10-15 ENCOUNTER — Encounter: Payer: Self-pay | Admitting: Obstetrics and Gynecology

## 2021-10-15 ENCOUNTER — Ambulatory Visit (INDEPENDENT_AMBULATORY_CARE_PROVIDER_SITE_OTHER): Payer: Medicaid Other | Admitting: Obstetrics and Gynecology

## 2021-10-15 ENCOUNTER — Observation Stay: Payer: Medicaid Other

## 2021-10-15 ENCOUNTER — Other Ambulatory Visit: Payer: Self-pay

## 2021-10-15 VITALS — BP 122/70 | Ht 65.0 in | Wt 351.8 lb

## 2021-10-15 DIAGNOSIS — O24113 Pre-existing diabetes mellitus, type 2, in pregnancy, third trimester: Secondary | ICD-10-CM

## 2021-10-15 DIAGNOSIS — O288 Other abnormal findings on antenatal screening of mother: Secondary | ICD-10-CM | POA: Diagnosis present

## 2021-10-15 DIAGNOSIS — O99212 Obesity complicating pregnancy, second trimester: Secondary | ICD-10-CM

## 2021-10-15 DIAGNOSIS — Z3A34 34 weeks gestation of pregnancy: Secondary | ICD-10-CM | POA: Diagnosis not present

## 2021-10-15 DIAGNOSIS — O0993 Supervision of high risk pregnancy, unspecified, third trimester: Secondary | ICD-10-CM

## 2021-10-15 LAB — FETAL NONSTRESS TEST

## 2021-10-15 LAB — PROTEIN / CREATININE RATIO, URINE
Creatinine, Urine: 86.7 mg/dL
Creatinine, Urine: 59 mg/dL
Protein Creatinine Ratio: 0.15 mg/mg{Cre} (ref 0.00–0.15)
Protein, Ur: 11.6 mg/dL
Protein/Creat Ratio: 134 mg/g creat (ref 0–200)
Total Protein, Urine: 9 mg/dL

## 2021-10-15 NOTE — OB Triage Note (Signed)
Pt. reports to Labor & Delivery today following a non-reactive NST during her routine OB office visit this morning. Pt. Is a 25yo G3P0  at [redacted]w[redacted]d with a hx of insulin-dependent Type II diabetes, anemia, and a BMI of 58. Dr. Jerene Pitch requests prolonged monitoring/NST, a BPP, and possibly a PIH workup. Pt. denies pain, ctx, LOF, and vaginal bleeding; states positive fetal movement. External [place] and [place] applied; initial FHT 141bpm. Vital signs WNL. BPs cycling q15 minutes. Will continue to assess.

## 2021-10-15 NOTE — Patient Instructions (Signed)
Preeclampsia and Eclampsia  Preeclampsia is a serious condition that may develop during pregnancy. This condition involves high blood pressure during pregnancy and causes symptoms such as headaches, vision changes, and increased swelling in the legs, hands, and face. Preeclampsia occurs after 20 weeks of pregnancy. Eclampsia is a seizure that happens from worsening preeclampsia. Diagnosing and managing preeclampsia early is important. If not treated early, it can cause serious problems for mother and baby.  There is no cure for this condition. However, during pregnancy, delivering the baby may be the best treatment for preeclampsia or eclampsia. For most women, symptoms of preeclampsia and eclampsia go away after giving birth. In rare cases, a woman may develop preeclampsia or eclampsia after giving birth. This usually occurs within 48 hours after childbirth but may occur up to 6 weeks after giving birth.  What are the causes?  The cause of this condition is not known.  What increases the risk?  The following factors make you more likely to develop preeclampsia:  Being pregnant for the first time or being pregnant with multiples.  Having had preeclampsia or a condition called hemolysis, elevated liver enzymes, and low platelet count (HELLP)syndrome during a past pregnancy.  Having a family history of preeclampsia.  Being older than age 35.  Being obese.  Becoming pregnant through fertility treatments.  Conditions that reduce blood flow or oxygen to your placenta and baby may also increase your risk. These include:  High blood pressure before, during, or immediately following pregnancy.  Kidney disease.  Diabetes.  Blood clotting disorders.  Autoimmune diseases, such as lupus.  Sleep apnea.  What are the signs or symptoms?  Common symptoms of this condition include:  A severe, throbbing headache that does not go away.  Vision problems, such as blurred or double vision and light sensitivity.  Pain in the stomach,  especially the right upper region.  Pain in the shoulder.  Other symptoms that may develop as the condition gets worse include:  Sudden weight gain because of fluid buildup in the body. This causes swelling of the face, hands, legs, and feet.  Severe nausea and vomiting.  Urinating less than usual.  Shortness of breath.  Seizures.  How is this diagnosed?    Your health care provider will ask you about symptoms and check for signs of preeclampsia during your prenatal visits. You will also have routine tests, including:  Checking your blood pressure.  Urine tests to check for protein.  Blood tests to assess your organ function.  Monitoring your baby's heart rate.  Ultrasounds to check fetal growth.  How is this treated?  You and your health care provider will determine the treatment that is best for you. Treatment may include:  Frequent prenatal visits to check for preeclampsia.  Medicine to lower your blood pressure.  Medicine to prevent seizures.  Low-dose aspirin during your pregnancy.  Staying in the hospital, in severe cases. You will be given medicines to control your blood pressure and the amount of fluids in your body.  Delivering your baby.  Work with your health care provider to manage any chronic health conditions, such as diabetes or kidney problems. Also, work with your health care provider to manage weight gain during pregnancy.  Follow these instructions at home:  Eating and drinking  Drink enough fluid to keep your urine pale yellow.  Avoid caffeine. Caffeine may increase blood pressure and heart rate and lead to dehydration.  Reduce the amount of salt that you eat.  Lifestyle    Do not use any products that contain nicotine or tobacco. These products include cigarettes, chewing tobacco, and vaping devices, such as e-cigarettes. If you need help quitting, ask your health care provider.  Do not use alcohol or drugs.  Avoid stress as much as possible.  Rest and get plenty of sleep.  General  instructions    Take over-the-counter and prescription medicines only as told by your health care provider.  When lying down, lie on your left side. This keeps pressure off your major blood vessels.  When sitting or lying down, raise (elevate) your feet. Try putting pillows underneath your lower legs.  Exercise regularly. Ask your health care provider what kinds of exercise are best for you.  Check your blood pressure as often as recommended by your health care provider.  Keep all prenatal and follow-up visits. This is important.  Contact a health care provider if:  You have symptoms that may need treatment or closer monitoring. These include:  Headaches.  Stomach pain or nausea and vomiting.  Shoulder pain.  Vision problems, such as spots in front of your eyes or blurry vision.  Sudden weight gain or increased swelling in your face, hands, legs, and feet.  Increased anxiety or feeling of impending doom.  Signs or symptoms of labor.  Get help right away if:  You have any of the following symptoms:  A seizure.  Shortness of breath or trouble breathing.  Trouble speaking or slurred speech.  Fainting.  Chest pain.  These symptoms may represent a serious problem that is an emergency. Do not wait to see if the symptoms will go away. Get medical help right away. Call your local emergency services (911 in the [place] Do not drive yourself to the hospital.  Summary  Preeclampsia is a serious condition that may develop during pregnancy.  Diagnosing and treating preeclampsia early is very important.  Keep all prenatal and follow-up visits. This is important.  Get help right away if you have a seizure, shortness of breath or trouble breathing, trouble speaking or slurred speech, chest pain, or fainting.  This information is not intended to replace advice given to you by your health care provider. Make sure you discuss any questions you have with your health care provider.  Document Revised: 08/14/2020 Document Reviewed:  08/14/2020  [employer] Patient Education  2022 [employer]

## 2021-10-15 NOTE — Discharge Summary (Signed)
Physician Final Progress Note  Patient ID: Robin Houston MRN: [id number] DOB/AGE: March 26, 1996 25 y.o.  Admit date: 10/15/2021 Admitting provider: Malachy Mood, MD Discharge date: 10/15/2021   Admission Diagnoses: Non-reactive NST  Discharge Diagnoses:  Active Problems:   NST (non-stress test) nonreactive   25 y.o. G3P0020 at 67w3dby Estimated Date of Delivery: 11/23/21 presenting from office for non-reactive NST.  NST here reactive.  BPP obtained and noted to be 8/8.  +FM, no LOF, no VB, no contractions  Temp:  [97.9 F (36.6 C)] 97.9 F (36.6 C) (11/10 1332) Pulse Rate:  [91-93] 93 (11/10 1433) Resp:  [18] 18 (11/10 1332) BP: (122-137)/(70-80) 137/80 (11/10 1433) Weight:  [159.6 kg] 159.6 kg (11/10 1051)   Consults: None  Significant Findings/ Diagnostic Studies:  Results for orders placed or performed during the hospital encounter of 10/15/21 (from the past 24 hour(s))  Protein / creatinine ratio, urine     Status: None   Collection Time: 10/15/21  1:31 PM  Result Value Ref Range   Creatinine, Urine 59 mg/dL   Total Protein, Urine 9 mg/dL   Protein Creatinine Ratio 0.15 0.00 - 0.15 mg/mg[Cre]    [place] BPP WO NON STRESS  Result Date: 10/15/2021 CLINICAL DATA:  Nonreactive nonstress test. Thirty-four week 3 day assigned gestational age. EXAM: BIOPHYSICAL PROFILE FINDINGS: Number of Fetuses: 1 Heart Rate: 150 bpm Presentation: Yes Movement: 2 time: 10 minutes Breathing: 2 Tone: 2 Amniotic Fluid: 2 Total Score: 8 IMPRESSION: Biophysical profile score of 8/8. Electronically Signed   By: JMarlaine HindM.D.   On: 10/15/2021 15:31   [place] FETAL BPP WO NON STRESS  Result Date: 10/09/2021 ----------------------------------------------------------------------  OBSTETRICS REPORT                       (Signed Final 10/09/2021 03:43 pm) ---------------------------------------------------------------------- Patient Info  ID #:       [id number]                         D.O.B.:   111-10-1996(25 yrs)  Name:       [name]                   Visit Date: 10/09/2021 02:49 pm ---------------------------------------------------------------------- Performed By  Attending:        CSander Nephew     Referred By:      WJola BabinskiOB/GYN                    MD  Performed By:     BGermain Osgood           [facility] ---------------------------------------------------------------------- Orders  #  Description                           Code        Ordered By  1  [place] MFM FETAL BPP WO NON               [id number]    RAVI [facility]     STRESS ----------------------------------------------------------------------  #  Order #                     Accession #                Episode #  1  [id number]                   [id number]                 [id number] ---------------------------------------------------------------------- Indications  Diabetes - Pregestational,3rd trimester        O24.313  (Metformin & Insulin)  Maternal morbid obesity (pregravid BMI 48)     O99.210 E66.01  Encounter for other antenatal screening        Z36.2  follow-up  [redacted] weeks gestation of pregnancy                Z3A.33 ---------------------------------------------------------------------- Fetal Evaluation  Num Of Fetuses:         1  Fetal Heart Rate(bpm):  157  Cardiac Activity:       Observed  Presentation:           Cephalic  Placenta:               Anterior  P. Cord Insertion:      Previously Visualized  Amniotic Fluid  AFI FV:      Within normal limits  AFI Sum(cm)     %Tile       Largest Pocket(cm)  8.92            10          4.65  RUQ(cm)       RLQ(cm)       LUQ(cm)        LLQ(cm)  4.65          1.35          1.51           1.41 ----------------------------------------------------------------------  Biophysical Evaluation  Amniotic F.V:   Within normal limits       F. Tone:        Observed  F. Movement:    Observed                   Score:          8/8  F. Breathing:   Observed ---------------------------------------------------------------------- Biometry  LV:        1.4  mm ---------------------------------------------------------------------- OB History  Gravidity:    3         Term:   0        Prem:   0        SAB:   2  TOP:          0       Ectopic:  0        Living: 0 ---------------------------------------------------------------------- Gestational Age  LMP:           46w 4d        Date:  11/17/20  EDD:   08/24/21  Best:          33w 4d     Det. By:  U/S C R L  (05/08/21)    EDD:   11/23/21 ---------------------------------------------------------------------- Anatomy  Ventricles:            Appears normal         Kidneys:                Appear normal  Diaphragm:             Appears normal         Bladder:                Appears normal  Stomach:               Appears normal, left                         sided ---------------------------------------------------------------------- Cervix Uterus Adnexa  Cervix  Not visualized (advanced GA >24wks) ---------------------------------------------------------------------- Impression  Antenatal testing performed given maternal T2DM and  elevated BMI > 40  The biophysical profile was 8/8 with good fetal movement and  amniotic fluid volume.  Robin Houston reports good FBS and 2hr PP. She takes Lantus  and Metformin.  Her blood pressure was 140/82, 142/82 and 119/75 mmHg.  She denies s/sx of preeclampsia. ---------------------------------------------------------------------- Recommendations  Continue weekly testing with serial growth exams as  previously scheduled. ----------------------------------------------------------------------               Sander Nephew, MD Electronically Signed Final Report   10/09/2021 03:43 pm  ----------------------------------------------------------------------  [place] MFM FETAL BPP WO NON STRESS  Result Date: 10/02/2021 ----------------------------------------------------------------------  OBSTETRICS REPORT                       (Signed Final 10/02/2021 03:31 pm) ---------------------------------------------------------------------- Patient Info  ID #:       [id number]                          D.O.B.:  11-06-1996 (25 yrs)  Name:       Robin Houston                    Visit Date: 10/02/2021 02:16 pm ---------------------------------------------------------------------- Performed By  Attending:        Tama High MD        Referred By:      Jola Babinski OB/GYN  Performed By:     Maryfrances Bunnell      [facility]                                                             [facility]                                                             [place] [facility] ---------------------------------------------------------------------- Orders  #  Description                           Code        Ordered By  1  [place] MFM OB FOLLOW UP                   [id number]    RAVI SHANKAR  2  [place] MFM FETAL BPP WO NON               [id number]    RAVI [facility]     STRESS ----------------------------------------------------------------------  #  Order #                     Accession #                Episode #  1  [id number]                   [id number]                 [id number]  2  [id number]                   [id number]                 [id number] ---------------------------------------------------------------------- Indications  [redacted] weeks gestation of pregnancy                Z3A.32  Diabetes - Pregestational,3rd trimester        O24.313  (Metformin & Insulin)  Maternal morbid obesity (pregravid BMI 48)     O99.210 E66.01  Encounter for other antenatal screening        Z36.2  follow-up  ---------------------------------------------------------------------- Fetal Evaluation  Num Of Fetuses:         1  Fetal Heart Rate(bpm):  132  Cardiac Activity:       Observed  Presentation:           Breech  Placenta:               Anterior  P. Cord Insertion:      Previously Visualized  Amniotic Fluid  AFI FV:      Within normal limits  AFI Sum(cm)     %Tile       Largest Pocket(cm)  19.09           71          7.11  RUQ(cm)       RLQ(cm)       LUQ(cm)        LLQ(cm)  4.42          3.07          7.11           4.49 ---------------------------------------------------------------------- Biophysical Evaluation  Amniotic F.V:   Pocket => 2 cm             F. Tone:        Observed  F. Movement:    Observed                   Score:          8/8  F. Breathing:   Observed ---------------------------------------------------------------------- Biometry  BPD:      82.8  mm     G. Age:  33w 2d         65  %    CI:  67.92   %    70 - 86                                                          FL/HC:      18.9   %    19.9 - 21.5  HC:      321.4  mm     G. Age:  36w 2d         95  %    HC/AC:      0.97        0.96 - 1.11  AC:      331.1  mm     G. Age:  37w 0d       > 99  %    FL/BPD:     73.2   %    71 - 87  FL:       60.6  mm     G. Age:  31w 4d         14  %    FL/AC:      18.3   %    20 - 24  LV:        5.9  mm  Est. FW:    2608  gm    5 lb 12 oz      98  % ---------------------------------------------------------------------- OB History  Gravidity:    3         Term:   0        Prem:   0        SAB:   2  TOP:          0       Ectopic:  0        Living: 0 ---------------------------------------------------------------------- Gestational Age  LMP:           45w 4d        Date:  11/17/20                 EDD:   08/24/21  U/S Today:     34w 4d                                        EDD:   11/09/21  Best:          32w 4d     Det. By:  U/S C R L  (05/08/21)    EDD:   11/23/21  ---------------------------------------------------------------------- Anatomy  Cranium:               Appears normal         Aortic Arch:            Previously seen  Cavum:                 Appears normal         Ductal Arch:            Previously seen  Ventricles:            Appears normal         Diaphragm:              Previously seen  Choroid Plexus:  Previously seen        Stomach:                Appears normal, left                                                                        sided  Cerebellum:            Previously seen        Abdomen:                Previously seen  Posterior Fossa:       Previously seen        Abdominal Wall:         Previously seen  Nuchal Fold:           Previously seen        Cord Vessels:           Previously seen  Face:                  Orbits nl; profile not Kidneys:                Appear normal                         well vis.  Lips:                  Previously seen        Bladder:                Appears normal  Thoracic:              Previously seen        Spine:                  Limited views                                                                        previously seen  Heart:                 Not well visualized    Upper Extremities:      Previously                                                                        Visualized  RVOT:                  Not well visualized    Lower Extremities:      Previously Vsualized  LVOT:                  Not well visualized  Other:  Female  gender previously seen. Technically difficult due to maternal          habitus and fetal position. ---------------------------------------------------------------------- Cervix Uterus Adnexa  Cervix  Not visualized (advanced GA >24wks)  Uterus  No abnormality visualized.  Right Ovary  Not visualized.  Left Ovary  Not visualized.  Adnexa  No adnexal mass visualized. ---------------------------------------------------------------------- Impression  Type 2 diabetes.  Patient takes  Lantus insulin 20 units at  night and Humalog 05/11/2009 units with meals.  She reports  both her fasting and postprandial levels are within normal  range.  Blood pressure today at her office is 132/74 mmHg.  On today's ultrasound the estimated fetal weight is at the 98th  percentile.  Amniotic fluid is normal good fetal activity seen.  Antenatal testing is reassuring.  BPP 8/8.  I explained the limitations of ultrasound and accurately  estimating fetal weights. ---------------------------------------------------------------------- Recommendations  -Continue weekly BPP till delivery. ----------------------------------------------------------------------                  Tama High, MD Electronically Signed Final Report   10/02/2021 03:31 pm ----------------------------------------------------------------------  [place] MFM OB FOLLOW UP  Result Date: 10/02/2021 ----------------------------------------------------------------------  OBSTETRICS REPORT                       (Signed Final 10/02/2021 03:31 pm) ---------------------------------------------------------------------- Patient Info  ID #:       [id number]                          D.O.B.:  1996-06-20 (25 yrs)  Name:       Robin Houston                    Visit Date: 10/02/2021 02:16 pm ---------------------------------------------------------------------- Performed By  Attending:        Tama High MD        Referred By:      Jola Babinski OB/GYN  Performed By:     Maryfrances Bunnell      [facility]                                                             [facility]                                                             [place] [facility]                                                             [facility] ---------------------------------------------------------------------- Orders  #  Description                           Code        Ordered By  1  [place] MFM OB FOLLOW UP  [id number]    RAVI SHANKAR  2  [place] MFM FETAL BPP WO NON                [id number]    RAVI SHANKAR     STRESS ----------------------------------------------------------------------  #  Order #                     Accession #                Episode #  1  [id number]                   [id number]                 [id number]  2  [id number]                   [id number]                 [id number] ---------------------------------------------------------------------- Indications  [redacted] weeks gestation of pregnancy                Z3A.32  Diabetes - Pregestational,3rd trimester        O24.313  (Metformin & Insulin)  Maternal morbid obesity (pregravid BMI 48)     O99.210 E66.01  Encounter for other antenatal screening        Z36.2  follow-up ---------------------------------------------------------------------- Fetal Evaluation  Num Of Fetuses:         1  Fetal Heart Rate(bpm):  132  Cardiac Activity:       Observed  Presentation:           Breech  Placenta:               Anterior  P. Cord Insertion:      Previously Visualized  Amniotic Fluid  AFI FV:      Within normal limits  AFI Sum(cm)     %Tile       Largest Pocket(cm)  19.09           71          7.11  RUQ(cm)       RLQ(cm)       LUQ(cm)        LLQ(cm)  4.42          3.07          7.11           4.49 ---------------------------------------------------------------------- Biophysical Evaluation  Amniotic F.V:   Pocket => 2 cm             F. Tone:        Observed  F. Movement:    Observed                   Score:          8/8  F. Breathing:   Observed ---------------------------------------------------------------------- Biometry  BPD:      82.8  mm     G. Age:  33w 2d         65  %    CI:        67.92   %    70 - 86  FL/HC:      18.9   %    19.9 - 21.5  HC:      321.4  mm     G. Age:  36w 2d         95  %    HC/AC:      0.97        0.96 - 1.11  AC:      331.1  mm     G. Age:  37w 0d       > 99  %    FL/BPD:     73.2   %    71 - 87  FL:       60.6  mm     G. Age:  31w 4d         14  %     FL/AC:      18.3   %    20 - 24  LV:        5.9  mm  Est. FW:    2608  gm    5 lb 12 oz      98  % ---------------------------------------------------------------------- OB History  Gravidity:    3         Term:   0        Prem:   0        SAB:   2  TOP:          0       Ectopic:  0        Living: 0 ---------------------------------------------------------------------- Gestational Age  LMP:           45w 4d        Date:  11/17/20                 EDD:   08/24/21  U/S Today:     34w 4d                                        EDD:   11/09/21  Best:          32w 4d     Det. By:  U/S C R L  (05/08/21)    EDD:   11/23/21 ---------------------------------------------------------------------- Anatomy  Cranium:               Appears normal         Aortic Arch:            Previously seen  Cavum:                 Appears normal         Ductal Arch:            Previously seen  Ventricles:            Appears normal         Diaphragm:              Previously seen  Choroid Plexus:        Previously seen        Stomach:                Appears normal, left  sided  Cerebellum:            Previously seen        Abdomen:                Previously seen  Posterior Fossa:       Previously seen        Abdominal Wall:         Previously seen  Nuchal Fold:           Previously seen        Cord Vessels:           Previously seen  Face:                  Orbits nl; profile not Kidneys:                Appear normal                         well vis.  Lips:                  Previously seen        Bladder:                Appears normal  Thoracic:              Previously seen        Spine:                  Limited views                                                                        previously seen  Heart:                 Not well visualized    Upper Extremities:      Previously                                                                        Visualized  RVOT:                   Not well visualized    Lower Extremities:      Previously Vsualized  LVOT:                  Not well visualized  Other:  Female gender previously seen. Technically difficult due to maternal          habitus and fetal position. ---------------------------------------------------------------------- Cervix Uterus Adnexa  Cervix  Not visualized (advanced GA >24wks)  Uterus  No abnormality visualized.  Right Ovary  Not visualized.  Left Ovary  Not visualized.  Adnexa  No adnexal mass visualized. ---------------------------------------------------------------------- Impression  Type 2 diabetes.  Patient takes Lantus insulin 20 units at  night and Humalog 05/11/2009 units with meals.  She reports  both her fasting and postprandial levels are within normal  range.  Blood pressure  today at her office is 132/74 mmHg.  On today's ultrasound the estimated fetal weight is at the 98th  percentile.  Amniotic fluid is normal good fetal activity seen.  Antenatal testing is reassuring.  BPP 8/8.  I explained the limitations of ultrasound and accurately  estimating fetal weights. ---------------------------------------------------------------------- Recommendations  -Continue weekly BPP till delivery. ----------------------------------------------------------------------                  Tama High, MD Electronically Signed Final Report   10/02/2021 03:31 pm ----------------------------------------------------------------------    Procedures:  Baseline: 135 Variability: moderate Accelerations: present Decelerations: absent Tocometry: none The patient was monitored for 30 minutes, fetal heart rate tracing was deemed reactive, category I tracing,  CPT [phone]   Discharge Condition: good  Disposition: Discharge disposition: 01-Home or Self Care       Diet: Regular diet  Discharge Activity: Activity as tolerated  Discharge Instructions     Discharge activity:  No Restrictions   Complete by: As directed     Discharge diet:  No restrictions   Complete by: As directed    Fetal Kick Count:  Lie on our left side for one hour after a meal, and count the number of times your baby kicks.  If it is less than 5 times, get up, move around and drink some juice.  Repeat the test 30 minutes later.  If it is still less than 5 kicks in an hour, notify your doctor.   Complete by: As directed    LABOR:  When conractions begin, you should start to time them from the beginning of one contraction to the beginning  of the next.  When contractions are 5 - 10 minutes apart or less and have been regular for at least an hour, you should call your health care provider.   Complete by: As directed    No sexual activity restrictions   Complete by: As directed    Notify physician for bleeding from the vagina   Complete by: As directed    Notify physician for blurring of vision or spots before the eyes   Complete by: As directed    Notify physician for chills or fever   Complete by: As directed    Notify physician for fainting spells, "black outs" or loss of consciousness   Complete by: As directed    Notify physician for increase in vaginal discharge   Complete by: As directed    Notify physician for leaking of fluid   Complete by: As directed    Notify physician for pain or burning when urinating   Complete by: As directed    Notify physician for pelvic pressure (sudden increase)   Complete by: As directed    Notify physician for severe or continued nausea or vomiting   Complete by: As directed    Notify physician for sudden gushing of fluid from the vagina (with or without continued leaking)   Complete by: As directed    Notify physician for sudden, constant, or occasional abdominal pain   Complete by: As directed    Notify physician if baby moving less than usual   Complete by: As directed       Allergies as of 10/15/2021   No Known Allergies      Medication List     TAKE these medications     Accu-Chek Guide w/Device Kit 1 KIT BY SUBDERMAL ROUTE AS DIRECTED. CHECK BLOOD SUGARS FOR FASTING, AND TWO HOURS AFTER BREAKFAST, LUNCH AND DINNER (4  CHECKS DAILY)   Accu-Chek Softclix Lancets lancets 1 each by Other route 4 (four) times daily.   aspirin EC 81 MG tablet Take 81 mg by mouth daily. Swallow whole.   ferrous sulfate 325 (65 FE) MG EC tablet Take 325 mg by mouth 3 (three) times daily with meals.   glucose blood test strip Use four times daily as instructed   insulin aspart 100 UNIT/ML FlexPen Commonly known as: NOVOLOG 6 units with breakfast, 10 units with lunch and dinner   insulin glargine 100 UNIT/ML Solostar Pen Commonly known as: LANTUS Inject 25 Units into the skin at bedtime.   Insulin Pen Needle 31G X 8 MM Misc 1 each by Does not apply route in the morning, at noon, in the evening, and at bedtime.   metFORMIN 1000 MG tablet Commonly known as: GLUCOPHAGE TAKE 1 TABLET (1,000 MG TOTAL) BY MOUTH 2 (TWO) TIMES DAILY WITH A MEAL.   multivitamin-prenatal 27-0.8 MG Tabs tablet Take 1 tablet by mouth daily at 12 noon.         Total time spent taking care of this patient: 30 minutes  Signed: Malachy Mood 10/15/2021, 3:55 PM

## 2021-10-15 NOTE — Progress Notes (Addendum)
Routine Prenatal Care Visit  Subjective  Robin Houston is a 25 y.o. G3P0020 at 105w3d being seen today for ongoing prenatal care.  She is currently monitored for the following issues for this high-risk pregnancy and has Symptomatic anemia; Supervision of high-risk pregnancy; Obesity affecting pregnancy in second trimester; and Pregnancy complicated by pre-existing type 2 diabetes on their problem list.  ----------------------------------------------------------------------------------- Patient reports no complaints.   Contractions: Not present. Vag. Bleeding: None.  Movement: Present. Denies leaking of fluid.  ----------------------------------------------------------------------------------- The following portions of the patient's history were reviewed and updated as appropriate: allergies, current medications, past family history, past medical history, past social history, past surgical history and problem list. Problem list updated.   Objective  Blood pressure 122/70, height 5\' 5"  (1.651 m), weight (!) 351 lb 12.8 oz (159.6 kg), last menstrual period 11/17/2020. Pregravid weight 295 lb (133.8 kg) Total Weight Gain 56 lb 12.8 oz (25.8 kg) Urinalysis:      Fetal Status: Fetal Heart Rate (bpm): 140   Movement: Present     General:  Alert, oriented and cooperative. Patient is in no acute distress.  Skin: Skin is warm and dry. No rash noted.   Cardiovascular: Normal heart rate noted  Respiratory: Normal respiratory effort, no problems with respiration noted  Abdomen: Soft, gravid, appropriate for gestational age. Pain/Pressure: Absent     Pelvic:  Cervical exam deferred        Extremities: Normal range of motion.  Edema: None  Mental Status: Normal mood and affect. Normal behavior. Normal judgment and thought content.     Assessment   25 y.o. G3P0020 at [redacted]w[redacted]d by  11/23/2021, by Ultrasound presenting for routine prenatal visit  Plan   May 2022 Problems (from 04/08/21 to present)      Problem Noted Resolved   Obesity affecting pregnancy in second trimester 04/15/2021 by 06/15/2021, MD No   Overview Signed 04/15/2021 11:16 AM by 06/15/2021, MD    BMI >=40 [ ]  screen sleep apnea [ ]  anesthesia consult (early and late if BMI > 45) [x ] u/s for dating [x ]  [x ] nutritional goals [x ] folic acid 1mg  [ ]  bASA (>12 weeks) [ ]  consider nutrition consult [ ]  consider maternal EKG 1st trimester [ ]  Growth u/s 28 [ ] , 32 [ ] , 36 weeks [ ]  [ ]  NST/AFI weekly 34+ weeks (34[] ,35[] ,36[] , 37[] , 38[] , 39[] , 40[] ) [ ]  IOL by 41 weeks (scheduled, prn [] )       Pregnancy complicated by pre-existing type 2 diabetes 04/15/2021 by , MD No   Overview Signed 04/15/2021 11:15 AM by , MD    Current Diabetic Medications:  Metformin  [ ]  Aspirin 81 mg daily after 12 weeks; discontinue after 36 weeks (? A2/B GDM)  For A2/B GDM or higher classes of DM [ ]  Diabetes Education and Testing Supplies [ ]  Nutrition Counsult [ ]  Fetal ECHO after 22-24 weeks  [ ]  Eye exam for retina evaluation  [ ]  Baseline EKG [ ]  fetal growth every 4 weeks starting at 28 weeks [ ]  Twice weekly NST starting at [redacted] weeks gestation [ ]  Delivery planning contingent on fetal growth, AFI, glycemic control, and other co-morbidities but at least by 39 weeks  Baseline and surveillance labs (pulled in from [facility], refresh links as needed)  Lab Results  Component Value Date   CREATININE 0.70 07/24/2020   AST 15 03/31/2020   ALT 12 03/31/2020  Lab Results  Component Value Date   HGBA1C 6.6 (H) 03/31/2020   Antenatal Testing Class of DM U/S NST/AFI DELIVERY  Diabetes   A1 - good control - O24.410    A2 - good control - O24.419      A2  - poor control or poor compliance - O24.419, E11.65   (Macrosomia or polyhydramnios) **E11.65 is extra code for poor control**    A2/B - O24.919  and B-C O24.319  Poor control B-C or D-R-F-T - O24.319  or  Type I DM - O24.019   20-38  20-38  20-24-28-32-36   20-24-28-32-35-38//fetal echo  20-24-27-30-33-36-38//fetal echo  40  32//2 x wk  32//2 x wk   32//2 x wk  28//BPP wkly then 32//2 x wk  40  39  PRN   39  PRN          Supervision of high-risk pregnancy 04/08/2021 by Natale Milch, MD No   Overview Addendum 10/01/2021  4:20 PM by Natale Milch, MD     Nursing Staff Provider  Office Location  [facility] Dating  [place] at 6 wks  Language  English Anatomy US  MFM 19 wks complete ECHO 22 wks [normal ]  Flu Vaccine  09/08/2021 Genetic Screen  NIPS:nml XY   TDaP vaccine   09/08/2021 Hgb A1C or  GTT GDMB   Covid vaccinated   LAB RESULTS   Rhogam  n/a Blood Type A/Positive/-- (05/04 1445)   Feeding Plan breast Antibody Negative (05/04 1445)  Contraception POP Rubella 1.03 (05/04 1445)  Circumcision  RPR Non Reactive (05/04 1445)   Pediatrician   HBsAg Negative (05/04 1445)   Support Person Husband HIV Non Reactive (05/04 1445)  Prenatal Classes  discussed Varicella Immune    GBS  (For PCN allergy, check sensitivities)   BTL Consent     VBAC Consent n/a Pap  04/08/2021    Hgb Electro         Obesity in pregnancy: pregravid BMI: 49 Type 2 diabetes in pregnancy 07/10/2021 increased to:   Lantus 20 units Novolog 6 units with meals 08/11/2021 increased to: Lantus 20 units Novolog 6 units with breakfast, lunch 8 units with dinner 10/01/2021 increased to: Lantus 25 units NovoLog 6 units with breakfast, 10 units with lunch and dinner           Blood glucose reviewed and within normal limits.  Continue current insulin regimen. Blood pressure normal today but has had prior elevated values suggested that patient may be a gestational hypertension consider medication initiation. NST nonreactive today patient To labor and delivery for prolonged monitoring and BPP. If blood pressures are elevated while she is on labor and delivery consider preeclampsia evaluation.  NST: 140 bpm  baseline, moderate variability, 10x10 accelerations, no decelerations. Not reactive   Gestational age appropriate obstetric precautions including but not limited to vaginal bleeding, contractions, leaking of fluid and fetal movement were reviewed in detail with the patient.    Return in about 1 week (around 10/22/2021) for ROB/NST with MD.  Natale Milch MD [facility], [facility] 10/15/2021, 12:11 PM

## 2021-10-16 ENCOUNTER — Ambulatory Visit: Payer: Medicaid Other

## 2021-10-21 ENCOUNTER — Ambulatory Visit (INDEPENDENT_AMBULATORY_CARE_PROVIDER_SITE_OTHER): Payer: Medicaid Other | Admitting: Obstetrics and Gynecology

## 2021-10-21 ENCOUNTER — Other Ambulatory Visit (HOSPITAL_COMMUNITY)
Admission: RE | Admit: 2021-10-21 | Discharge: 2021-10-21 | Disposition: A | Discharge status: Home / Self Care | Payer: Medicaid Other | Source: Ambulatory Visit | Attending: Obstetrics and Gynecology | Admitting: Obstetrics and Gynecology

## 2021-10-21 ENCOUNTER — Encounter: Payer: Self-pay | Admitting: Obstetrics and Gynecology

## 2021-10-21 ENCOUNTER — Other Ambulatory Visit: Payer: Self-pay

## 2021-10-21 ENCOUNTER — Encounter: Payer: Medicaid Other | Admitting: Obstetrics and Gynecology

## 2021-10-21 VITALS — BP 140/80 | Ht 65.0 in | Wt 355.8 lb

## 2021-10-21 DIAGNOSIS — O0993 Supervision of high risk pregnancy, unspecified, third trimester: Secondary | ICD-10-CM | POA: Insufficient documentation

## 2021-10-21 DIAGNOSIS — Z3A35 35 weeks gestation of pregnancy: Secondary | ICD-10-CM

## 2021-10-21 DIAGNOSIS — O163 Unspecified maternal hypertension, third trimester: Secondary | ICD-10-CM

## 2021-10-21 LAB — FETAL NONSTRESS TEST

## 2021-10-21 LAB — OB RESULTS CONSOLE GC/CHLAMYDIA: Gonorrhea: NEGATIVE

## 2021-10-21 NOTE — Progress Notes (Signed)
Routine Prenatal Care Visit  Subjective  Robin Houston is a 25 y.o. G3P0020 at [redacted]w[redacted]d being seen today for ongoing prenatal care.  She is currently monitored for the following issues for this high-risk pregnancy and has Symptomatic anemia; Supervision of high-risk pregnancy; Obesity affecting pregnancy in second trimester; Pregnancy complicated by pre-existing type 2 diabetes; and NST (non-stress test) nonreactive on their problem list.  ----------------------------------------------------------------------------------- Patient reports no complaints.   Contractions: Irregular. Vag. Bleeding: None.  Movement: Present. Denies leaking of fluid.  ----------------------------------------------------------------------------------- The following portions of the patient's history were reviewed and updated as appropriate: allergies, current medications, past family history, past medical history, past social history, past surgical history and problem list. Problem list updated.   Objective  Blood pressure 140/80, height 5\' 5"  (1.651 m), weight (!) 355 lb 12.8 oz (161.4 kg), last menstrual period 11/17/2020. Pregravid weight 295 lb (133.8 kg) Total Weight Gain 60 lb 12.8 oz (27.6 kg) Urinalysis:      Fetal Status: Fetal Heart Rate (bpm): 150   Movement: Present     General:  Alert, oriented and cooperative. Patient is in no acute distress.  Skin: Skin is warm and dry. No rash noted.   Cardiovascular: Normal heart rate noted  Respiratory: Normal respiratory effort, no problems with respiration noted  Abdomen: Soft, gravid, appropriate for gestational age. Pain/Pressure: Present     Pelvic:  Cervical exam deferred        Extremities: Normal range of motion.  Edema: None  Mental Status: Normal mood and affect. Normal behavior. Normal judgment and thought content.     Assessment   25 y.o. G3P0020 at [redacted]w[redacted]d by  11/23/2021, by Ultrasound presenting for routine prenatal visit  Plan   May 2022  Problems (from 04/08/21 to present)     Problem Noted Resolved   Obesity affecting pregnancy in second trimester 04/15/2021 by 06/15/2021, MD No   Overview Signed 04/15/2021 11:16 AM by 06/15/2021, MD    BMI >=40 [ ]  screen sleep apnea [ ]  anesthesia consult (early and late if BMI > 45) [x ] u/s for dating [x ]  [x ] nutritional goals [x ] folic acid 1mg  [ ]  bASA (>12 weeks) [ ]  consider nutrition consult [ ]  consider maternal EKG 1st trimester [ ]  Growth u/s 28 [ ] , 32 [ ] , 36 weeks [ ]  [ ]  NST/AFI weekly 34+ weeks (34[] ,35[] ,36[] , 37[] , 38[] , 39[] , 40[] ) [ ]  IOL by 41 weeks (scheduled, prn [] )       Pregnancy complicated by pre-existing type 2 diabetes 04/15/2021 by , MD No   Overview Signed 04/15/2021 11:15 AM by , MD    Current Diabetic Medications:  Metformin  [ ]  Aspirin 81 mg daily after 12 weeks; discontinue after 36 weeks (? A2/B GDM)  For A2/B GDM or higher classes of DM [ ]  Diabetes Education and Testing Supplies [ ]  Nutrition Counsult [ ]  Fetal ECHO after 22-24 weeks  [ ]  Eye exam for retina evaluation  [ ]  Baseline EKG [ ]  fetal growth every 4 weeks starting at 28 weeks [ ]  Twice weekly NST starting at [redacted] weeks gestation [ ]  Delivery planning contingent on fetal growth, AFI, glycemic control, and other co-morbidities but at least by 39 weeks  Baseline and surveillance labs (pulled in from [facility], refresh links as needed)  Lab Results  Component Value Date   CREATININE 0.70 07/24/2020   AST 15 03/31/2020   ALT  12 03/31/2020   Lab Results  Component Value Date   HGBA1C 6.6 (H) 03/31/2020   Antenatal Testing Class of DM U/S NST/AFI DELIVERY  Diabetes   A1 - good control - O24.410    A2 - good control - O24.419      A2  - poor control or poor compliance - O24.419, E11.65   (Macrosomia or polyhydramnios) **E11.65 is extra code for poor control**    A2/B - O24.919  and B-C O24.319  Poor control B-C or D-R-F-T -  O24.319  or  Type I DM - O24.019  20-38  20-38  20-24-28-32-36   20-24-28-32-35-38//fetal echo  20-24-27-30-33-36-38//fetal echo  40  32//2 x wk  32//2 x wk   32//2 x wk  28//BPP wkly then 32//2 x wk  40  39  PRN   39  PRN          Supervision of high-risk pregnancy 04/08/2021 by Homero Fellers, MD No   Overview Addendum 10/01/2021  4:20 PM by Homero Fellers, MD     Nursing Staff Provider  Office Location  [facility] Dating  [place] at 6 wks  Language  English Anatomy US  MFM 19 wks complete ECHO 22 wks [normal ]  Flu Vaccine  09/08/2021 Genetic Screen  NIPS:nml XY   TDaP vaccine   09/08/2021 Hgb A1C or  GTT GDMB   Covid vaccinated   LAB RESULTS   Rhogam  n/a Blood Type A/Positive/-- (05/04 1445)   Feeding Plan breast Antibody Negative (05/04 1445)  Contraception POP Rubella 1.03 (05/04 1445)  Circumcision  RPR Non Reactive (05/04 1445)   Pediatrician   HBsAg Negative (05/04 1445)   Support Person Husband HIV Non Reactive (05/04 1445)  Prenatal Classes  discussed Varicella Immune    GBS  (For PCN allergy, check sensitivities)   BTL Consent     VBAC Consent n/a Pap  04/08/2021    Hgb Electro         Obesity in pregnancy: pregravid BMI: 49 Type 2 diabetes in pregnancy 07/10/2021 increased to:   Lantus 20 units Novolog 6 units with meals 08/11/2021 increased to: Lantus 20 units Novolog 6 units with breakfast, lunch 8 units with dinner 10/01/2021 increased to: Lantus 25 units NovoLog 6 units with breakfast, 10 units with lunch and dinner         IOL planned for 11/02/2021- orders placed  NST: 150 bpm baseline, moderate variability, 15x15 accelerations, no decelerations. Reactive tracing     Gestational age appropriate obstetric precautions including but not limited to vaginal bleeding, contractions, leaking of fluid and fetal movement were reviewed in detail with the patient.    Return in about 1 week (around 10/28/2021) for ROB/NST with  MD.  Homero Fellers MD [place], [place] 10/21/2021, 3:51 PM

## 2021-10-21 NOTE — Patient Instructions (Addendum)
Covid Testing at 10/30/2021 between 8-4PM am, [facility]. Follow signs for Pre-admission testing. Please wear a mask.  Induction 11/02/2021 at 8 AM . Enter through the [facility] for an 8 AM induction. Please enter through the ER for a midnight or 5 AM induction.  Please eat a meal prior to your arrival.    Third Trimester of Pregnancy The third trimester of pregnancy is from week 28 through week 40. This is months 7 through 9. The third trimester is a time when the unborn baby (fetus) is growing rapidly. At the end of the ninth month, the fetus is about 20 inches long and weighs 6-10 pounds. Body changes during your third trimester During the third trimester, your body will continue to go through many changes. The changes vary and generally return to normal after your baby is born. Physical changes Your weight will continue to increase. You can expect to gain 25-35 pounds (11-16 kg) by the end of the pregnancy if you begin pregnancy at a normal weight. If you are underweight, you can expect to gain 28-40 lb (about 13-18 kg), and if you are overweight, you can expect to gain 15-25 lb (about 7-11 kg). You may begin to get stretch marks on your hips, abdomen, and breasts. Your breasts will continue to grow and may hurt. A yellow fluid (colostrum) may leak from your breasts. This is the first milk you are producing for your baby. You may have changes in your hair. These can include thickening of your hair, rapid growth, and changes in texture. Some people also have hair loss during or after pregnancy, or hair that feels dry or thin. Your belly button may stick out. You may notice more swelling in your hands, face, or ankles. Health changes You may have heartburn. You may have constipation. You may develop hemorrhoids. You may develop swollen, bulging veins (varicose veins) in your legs. You may have increased body aches in the pelvis, back, or thighs. This is due to weight gain  and increased hormones that are relaxing your joints. You may have increased tingling or numbness in your hands, arms, and legs. The skin on your abdomen may also feel numb. You may feel short of breath because of your expanding uterus. Other changes You may urinate more often because the fetus is moving lower into your pelvis and pressing on your bladder. You may have more problems sleeping. This may be caused by the size of your abdomen, an increased need to urinate, and an increase in your body's metabolism. You may notice the fetus "dropping," or moving lower in your abdomen (lightening). You may have increased vaginal discharge. You may notice that you have pain around your pelvic bone as your uterus distends. Follow these instructions at home: Medicines Follow your health care provider's instructions regarding medicine use. Specific medicines may be either safe or unsafe to take during pregnancy. Do not take any medicines unless approved by your health care provider. Take a prenatal vitamin that contains at least 600 micrograms (mcg) of folic acid. Eating and drinking Eat a healthy diet that includes fresh fruits and vegetables, whole grains, good sources of protein such as meat, eggs, or tofu, and low-fat dairy products. Avoid raw meat and unpasteurized juice, milk, and cheese. These carry germs that can harm you and your baby. Eat 4 or 5 small meals rather than 3 large meals a day. You may need to take these actions to prevent or treat constipation: Drink enough  fluid to keep your urine pale yellow. Eat foods that are high in fiber, such as beans, whole grains, and fresh fruits and vegetables. Limit foods that are high in fat and processed sugars, such as fried or sweet foods. Activity Exercise only as directed by your health care provider. Most people can continue their usual exercise routine during pregnancy. Try to exercise for 30 minutes at least 5 days a week. Stop exercising if  you experience contractions in the uterus. Stop exercising if you develop pain or cramping in the lower abdomen or lower back. Avoid heavy lifting. Do not exercise if it is very hot or humid or if you are at a high altitude. If you choose to, you may continue to have sex unless your health care provider tells you not to. Relieving pain and discomfort Take frequent breaks and rest with your legs raised (elevated) if you have leg cramps or low back pain. Take warm sitz baths to soothe any pain or discomfort caused by hemorrhoids. Use hemorrhoid cream if your health care provider approves. Wear a supportive bra to prevent discomfort from breast tenderness. If you develop varicose veins: Wear support hose as told by your health care provider. Elevate your feet for 15 minutes, 3-4 times a day. Limit salt in your diet. Safety Talk to your health care provider before traveling far distances. Do not use hot tubs, steam rooms, or saunas. Wear your seat belt at all times when driving or riding in a car. Talk with your health care provider if someone is verbally or physically abusive to you. Preparing for birth To prepare for the arrival of your baby: Take prenatal classes to understand, practice, and ask questions about labor and delivery. Visit the hospital and tour the maternity area. Purchase a rear-facing car seat and make sure you know how to install it in your car. Prepare the baby's room or sleeping area. Make sure to remove all pillows and stuffed animals from the baby's crib to prevent suffocation. General instructions Avoid cat litter boxes and soil used by cats. These carry germs that can cause birth defects in the baby. If you have a cat, ask someone to clean the litter box for you. Do not douche or use tampons. Do not use scented sanitary pads. Do not use any products that contain nicotine or tobacco, such as cigarettes, e-cigarettes, and chewing tobacco. If you need help quitting, ask  your health care provider. Do not use any herbal remedies, illegal drugs, or medicines that were not prescribed to you. Chemicals in these products can harm your baby. Do not drink alcohol. You will have more frequent prenatal exams during the third trimester. During a routine prenatal visit, your health care provider will do a physical exam, perform tests, and discuss your overall health. Keep all follow-up visits. This is important. Where to find more information American Pregnancy Association: americanpregnancy.org [employer]: https://www.todd-brady.net/ Office on [employer] Health: MightyReward.co.nz Contact a health care provider if you have: A fever. Mild pelvic cramps, pelvic pressure, or nagging pain in your abdominal area or lower back. Vomiting or diarrhea. Bad-smelling vaginal discharge or foul-smelling urine. Pain when you urinate. A headache that does not go away when you take medicine. Visual changes or see spots in front of your eyes. Get help right away if: Your water breaks. You have regular contractions less than 5 minutes apart. You have spotting or bleeding from your vagina. You have severe abdominal pain. You have difficulty breathing.  You have chest pain. You have fainting spells. You have not felt your baby move for the time period told by your health care provider. You have new or increased pain, swelling, or redness in an arm or leg. Summary The third trimester of pregnancy is from week 28 through week 40 (months 7 through 9). You may have more problems sleeping. This can be caused by the size of your abdomen, an increased need to urinate, and an increase in your body's metabolism. You will have more frequent prenatal exams during the third trimester. Keep all follow-up visits. This is important. This information is not intended to replace advice given to you by your health care provider. Make sure you  discuss any questions you have with your health care provider. Document Revised: 04/30/2020 Document Reviewed: 03/06/2020 Elsevier Patient Education  2022 [employer].   Labor Induction Labor induction is when steps are taken to cause a pregnant woman to begin the labor process. Most women go into labor on their own between 37 weeks and 42 weeks of pregnancy. When this does not happen, or when there is a medical need for labor to begin, steps may be taken to induce, or bring on, labor. Labor induction causes a pregnant woman's uterus to contract. It also causes the cervix to soften (ripen), open (dilate), and thin out. Usually, labor is not induced before 39 weeks of pregnancy unless there is a medical reason to do so. When is labor induction considered? Labor induction may be right for you if: Your pregnancy lasts longer than 41 to 42 weeks. Your placenta is separating from your uterus (placental abruption). You have a rupture of membranes and your labor does not begin. You have health problems, like diabetes or high blood pressure (preeclampsia) during your pregnancy. Your baby has stopped growing or does not have enough amniotic fluid. Before labor induction begins, your health care provider will consider the following factors: Your medical condition and the baby's condition. How many weeks you have been pregnant. How mature the baby's lungs are. The condition of your cervix. The position of the baby. The size of your birth canal. Tell a health care provider about: Any allergies you have. All medicines you are taking, including vitamins, herbs, eye drops, creams, and over-the-counter medicines. Any problems you or your family members have had with anesthetic medicines. Any surgeries you have had. Any blood disorders you have. Any medical conditions you have. What are the risks? Generally, this is a safe procedure. However, problems may occur, including: Failed induction. Changes  in fetal heart rate, such as being too high, too low, or irregular (erratic). Infection in the mother or the baby. Increased risk of having a cesarean delivery. Breaking off (abruption) of the placenta from the uterus. This is rare. Rupture of the uterus. This is very rare. Your baby could fail to get enough blood flow or oxygen. This can be life-threatening. When induction is needed for medical reasons, the benefits generally outweigh the risks. What happens during the procedure? During the procedure, your health care provider will use one of these methods to induce labor: Stripping the membranes. In this method, the amniotic sac tissue is gently separated from the cervix. This causes the following to happen: Your cervix stretches, which in turn causes the release of prostaglandins. Prostaglandins induce labor and cause the uterus to contract. This procedure is often done in an office visit. You will be sent home to wait for contractions to begin. Prostaglandin medicine. This medicine  starts contractions and causes the cervix to dilate and ripen. This can be taken by mouth (orally) or by being inserted into the vagina (suppository). Inserting a small, thin tube (catheter) with a balloon into the vagina and then expanding the balloon with water to dilate the cervix. Breaking the water. In this method, a small instrument is used to make a small hole in the amniotic sac. This eventually causes the amniotic sac to break. Contractions should begin within a few hours. Medicine to trigger or strengthen contractions. This medicine is given through an IV that is inserted into a vein in your arm. This procedure may vary among health care providers and hospitals. Where to find more information [employer]: www.marchofdimes.org [employer]: www.acog.org Summary Labor induction causes a pregnant woman's uterus to contract. It also causes the cervix to soften  (ripen), open (dilate), and thin out. Labor is usually not induced before 39 weeks of pregnancy unless there is a medical reason to do so. When induction is needed for medical reasons, the benefits generally outweigh the risks. Talk with your health care provider about which methods of labor induction are right for you. This information is not intended to replace advice given to you by your health care provider. Make sure you discuss any questions you have with your health care provider. Document Revised: 09/04/2020 Document Reviewed: 09/04/2020 Elsevier Patient Education  2022 [employer].

## 2021-10-22 LAB — COMPREHENSIVE METABOLIC PANEL
ALT: 13 IU/L (ref 0–32)
AST: 11 IU/L (ref 0–40)
Albumin/Globulin Ratio: 1 — ABNORMAL LOW (ref 1.2–2.2)
Albumin: 3.4 g/dL — ABNORMAL LOW (ref 3.9–5.0)
Alkaline Phosphatase: 115 IU/L (ref 44–121)
BUN/Creatinine Ratio: 15 (ref 9–23)
BUN: 9 mg/dL (ref 6–20)
Bilirubin Total: <0.2 mg/dL (ref 0.0–1.2)
CO2: 21 mmol/L (ref 20–29)
Calcium: 9.3 mg/dL (ref 8.7–10.2)
Chloride: 102 mmol/L (ref 96–106)
Creatinine, Ser: 0.59 mg/dL (ref 0.57–1.00)
Globulin, Total: 3.4 g/dL (ref 1.5–4.5)
Glucose: 90 mg/dL (ref 70–99)
Potassium: 4.2 mmol/L (ref 3.5–5.2)
Sodium: 139 mmol/L (ref 134–144)
Total Protein: 6.8 g/dL (ref 6.0–8.5)
eGFR: 127 mL/min/{1.73_m2} (ref >59)

## 2021-10-22 LAB — CBC WITH DIFFERENTIAL
Basophils Absolute: 0 10*3/uL (ref 0.0–0.2)
Basos: 0 %
EOS (ABSOLUTE): 0.1 10*3/uL (ref 0.0–0.4)
Eos: 1 %
Hematocrit: 35.8 % (ref 34.0–46.6)
Hemoglobin: 11.4 g/dL (ref 11.1–15.9)
Immature Grans (Abs): 0 10*3/uL (ref 0.0–0.1)
Immature Granulocytes: 0 %
Lymphocytes Absolute: 1.7 10*3/uL (ref 0.7–3.1)
Lymphs: 17 %
MCH: 24.7 pg — ABNORMAL LOW (ref 26.6–33.0)
MCHC: 31.8 g/dL (ref 31.5–35.7)
MCV: 78 fL — ABNORMAL LOW (ref 79–97)
Monocytes Absolute: 0.7 10*3/uL (ref 0.1–0.9)
Monocytes: 7 %
Neutrophils Absolute: 7.5 10*3/uL — ABNORMAL HIGH (ref 1.4–7.0)
Neutrophils: 75 %
RBC: 4.61 x10E6/uL (ref 3.77–5.28)
RDW: 14.5 % (ref 11.7–15.4)
WBC: 10 10*3/uL (ref 3.4–10.8)

## 2021-10-22 LAB — PROTEIN / CREATININE RATIO, URINE
Creatinine, Urine: 120.6 mg/dL
Protein, Ur: 14.8 mg/dL
Protein/Creat Ratio: 123 mg/g creat (ref 0–200)

## 2021-10-23 ENCOUNTER — Other Ambulatory Visit: Payer: Self-pay

## 2021-10-23 ENCOUNTER — Ambulatory Visit: Payer: Medicaid Other | Admitting: *Deleted

## 2021-10-23 ENCOUNTER — Ambulatory Visit: Payer: Medicaid Other | Attending: Obstetrics and Gynecology

## 2021-10-23 ENCOUNTER — Encounter: Payer: Self-pay | Admitting: *Deleted

## 2021-10-23 VITALS — BP 126/76 | HR 103

## 2021-10-23 DIAGNOSIS — O99213 Obesity complicating pregnancy, third trimester: Secondary | ICD-10-CM | POA: Diagnosis not present

## 2021-10-23 DIAGNOSIS — Z3A35 35 weeks gestation of pregnancy: Secondary | ICD-10-CM

## 2021-10-23 DIAGNOSIS — E669 Obesity, unspecified: Secondary | ICD-10-CM

## 2021-10-23 DIAGNOSIS — O99212 Obesity complicating pregnancy, second trimester: Secondary | ICD-10-CM | POA: Diagnosis present

## 2021-10-23 DIAGNOSIS — O0993 Supervision of high risk pregnancy, unspecified, third trimester: Secondary | ICD-10-CM | POA: Insufficient documentation

## 2021-10-23 DIAGNOSIS — O24113 Pre-existing diabetes mellitus, type 2, in pregnancy, third trimester: Secondary | ICD-10-CM | POA: Insufficient documentation

## 2021-10-23 LAB — CERVICOVAGINAL ANCILLARY ONLY
Chlamydia: NEGATIVE
Comment: NEGATIVE
Comment: NEGATIVE
Comment: NORMAL
Neisseria Gonorrhea: NEGATIVE
Trichomonas: NEGATIVE

## 2021-10-25 LAB — CULTURE, BETA STREP (GROUP B ONLY): Strep Gp B Culture: NEGATIVE

## 2021-10-28 ENCOUNTER — Other Ambulatory Visit: Payer: Self-pay

## 2021-10-28 ENCOUNTER — Ambulatory Visit (INDEPENDENT_AMBULATORY_CARE_PROVIDER_SITE_OTHER): Payer: Medicaid Other | Admitting: Obstetrics and Gynecology

## 2021-10-28 VITALS — BP 126/82 | Wt 364.0 lb

## 2021-10-28 DIAGNOSIS — Z3A36 36 weeks gestation of pregnancy: Secondary | ICD-10-CM

## 2021-10-28 DIAGNOSIS — O1413 Severe pre-eclampsia, third trimester: Secondary | ICD-10-CM

## 2021-10-28 DIAGNOSIS — O24113 Pre-existing diabetes mellitus, type 2, in pregnancy, third trimester: Secondary | ICD-10-CM | POA: Diagnosis not present

## 2021-10-28 DIAGNOSIS — O99212 Obesity complicating pregnancy, second trimester: Secondary | ICD-10-CM | POA: Diagnosis not present

## 2021-10-28 DIAGNOSIS — Z3685 Encounter for antenatal screening for Streptococcus B: Secondary | ICD-10-CM

## 2021-10-28 DIAGNOSIS — O0993 Supervision of high risk pregnancy, unspecified, third trimester: Secondary | ICD-10-CM

## 2021-10-28 LAB — FETAL NONSTRESS TEST

## 2021-10-28 NOTE — Progress Notes (Signed)
Routine Prenatal Care Visit  Subjective  Robin Houston is a 25 y.o. G3P0020 at [redacted]w[redacted]d being seen today for ongoing prenatal care.  She is currently monitored for the following issues for this high-risk pregnancy and has Symptomatic anemia; Supervision of high-risk pregnancy; Obesity affecting pregnancy in second trimester; Pregnancy complicated by pre-existing type 2 diabetes; and NST (non-stress test) nonreactive on their problem list.  ----------------------------------------------------------------------------------- Patient reports no complaints.  She denies headaches, vision changes, RUQ or epigastric pain.  Contractions: Irregular. Vag. Bleeding: None.  Movement: Present. Denies leaking of fluid.  ----------------------------------------------------------------------------------- The following portions of the patient's history were reviewed and updated as appropriate: allergies, current medications, past family history, past medical history, past social history, past surgical history and problem list. Problem list updated.   Objective  Blood pressure (!) 150/92, weight (!) 364 lb (165.1 kg), last menstrual period 11/17/2020. Prior BP earlier today 126/76 at [facility] 10/23/2021, and repeat today 126/82 Pregravid weight 295 lb (133.8 kg) Total Weight Gain 69 lb (31.3 kg) Body mass index is 60.57 kg/m.  Urinalysis:      Fetal Status: Fetal Heart Rate (bpm): 150   Movement: Present  Presentation: Vertex  General:  Alert, oriented and cooperative. Patient is in no acute distress.  Skin: Skin is warm and dry. No rash noted.   Cardiovascular: Normal heart rate noted  Respiratory: Normal respiratory effort, no problems with respiration noted  Abdomen: Soft, gravid, appropriate for gestational age. Pain/Pressure: Present     Pelvic:  Cervical exam performed        Extremities: Normal range of motion.     ental Status: Normal mood and affect. Normal behavior. Normal judgment and thought  content.   Baseline: 150 Variability: moderate Accelerations: present Decelerations: absent Tocometry: N/A The patient was monitored for 30 minutes, fetal heart rate tracing was deemed reactive, category I tracing,  CPT [phone]   Assessment   25 y.o. G3P0020 at [redacted]w[redacted]d by  11/23/2021, by Ultrasound presenting for routine prenatal visit  Plan   May 2022 Problems (from 04/08/21 to present)     Problem Noted Resolved   Obesity affecting pregnancy in second trimester 04/15/2021 by Nadara Mustard, MD No   Overview Signed 04/15/2021 11:16 AM by Nadara Mustard, MD    BMI >=40 [ ]  screen sleep apnea [ ]  anesthesia consult (early and late if BMI > 45) [x ] u/s for dating [x ]  [x ] nutritional goals [x ] folic acid 1mg  [ ]  bASA (>12 weeks) [ ]  consider nutrition consult [ ]  consider maternal EKG 1st trimester [ ]  Growth u/s 28 [ ] , 32 [ ] , 36 weeks [ ]  [ ]  NST/AFI weekly 34+ weeks (34[] ,35[] ,36[] , 37[] , 38[] , 39[] , 40[] ) [ ]  IOL by 41 weeks (scheduled, prn [] )       Pregnancy complicated by pre-existing type 2 diabetes 04/15/2021 by , MD No   Overview Signed 04/15/2021 11:15 AM by , MD    Current Diabetic Medications:  Metformin  [ ]  Aspirin 81 mg daily after 12 weeks; discontinue after 36 weeks (? A2/B GDM)  For A2/B GDM or higher classes of DM [ ]  Diabetes Education and Testing Supplies [ ]  Nutrition Counsult [ ]  Fetal ECHO after 22-24 weeks  [ ]  Eye exam for retina evaluation  [ ]  Baseline EKG [ ]  fetal growth every 4 weeks starting at 28 weeks [ ]  Twice weekly NST starting at [redacted] weeks gestation [ ]  Delivery  planning contingent on fetal growth, AFI, glycemic control, and other co-morbidities but at least by 39 weeks  Baseline and surveillance labs (pulled in from [facility], refresh links as needed)  Lab Results  Component Value Date   CREATININE 0.70 07/24/2020   AST 15 03/31/2020   ALT 12 03/31/2020   Lab Results  Component Value Date    HGBA1C 6.6 (H) 03/31/2020   Antenatal Testing Class of DM U/S NST/AFI DELIVERY  Diabetes   A1 - good control - O24.410    A2 - good control - O24.419      A2  - poor control or poor compliance - O24.419, E11.65   (Macrosomia or polyhydramnios) **E11.65 is extra code for poor control**    A2/B - O24.919  and B-C O24.319  Poor control B-C or D-R-F-T - O24.319  or  Type I DM - O24.019  20-38  20-38  20-24-28-32-36   20-24-28-32-35-38//fetal echo  20-24-27-30-33-36-38//fetal echo  40  32//2 x wk  32//2 x wk   32//2 x wk  28//BPP wkly then 32//2 x wk  40  39  PRN   39  PRN          Supervision of high-risk pregnancy 04/08/2021 by Homero Fellers, MD No   Overview Addendum 10/01/2021  4:20 PM by Homero Fellers, MD     Nursing Staff Provider  Office Location  [facility] Dating  [place] at 6 wks  Language  English Anatomy US  MFM 19 wks complete ECHO 22 wks [normal ]  Flu Vaccine  09/08/2021 Genetic Screen  NIPS:nml XY   TDaP vaccine   09/08/2021 Hgb A1C or  GTT GDMB   Covid vaccinated   LAB RESULTS   Rhogam  n/a Blood Type A/Positive/-- (05/04 1445)   Feeding Plan breast Antibody Negative (05/04 1445)  Contraception POP Rubella 1.03 (05/04 1445)  Circumcision  RPR Non Reactive (05/04 1445)   Pediatrician   HBsAg Negative (05/04 1445)   Support Person Husband HIV Non Reactive (05/04 1445)  Prenatal Classes  discussed Varicella Immune    GBS  (For PCN allergy, check sensitivities)   BTL Consent     VBAC Consent n/a Pap  04/08/2021    Hgb Electro         Obesity in pregnancy: pregravid BMI: 49 Type 2 diabetes in pregnancy 07/10/2021 increased to:   Lantus 20 units Novolog 6 units with meals 08/11/2021 increased to: Lantus 20 units Novolog 6 units with breakfast, lunch 8 units with dinner 10/01/2021 increased to: Lantus 25 units NovoLog 6 units with breakfast, 10 units with lunch and dinner           Gestational age appropriate obstetric  precautions including but not limited to vaginal bleeding, contractions, leaking of fluid and fetal movement were reviewed in detail with the patient.    1) GDM - BPP 8/10 - NST today reactive - IOL scheduled for 11/02/21 - Growth 10/02/2021 at 32 weeks 5lbs 12oz c/w 98%ile  2) Elevated BP - 150/92 was 126/76 at MFM visit last week, repeat today 126/82 - 9lbs weight gain in the past week - Asymptomatic to L&D for labs if normal keep 37 week IOL plan  No follow-ups on file.  Malachy Mood, MD, [facility], [facility] 10/28/2021, 3:34 PM

## 2021-10-28 NOTE — Progress Notes (Signed)
ROB - NST, GBS, no concerns. RM 4

## 2021-10-30 ENCOUNTER — Encounter: Payer: Self-pay | Admitting: Obstetrics and Gynecology

## 2021-10-30 LAB — STREP GP B NAA: Strep Gp B NAA: NEGATIVE

## 2021-11-02 ENCOUNTER — Ambulatory Visit: Payer: Medicaid Other

## 2021-11-02 ENCOUNTER — Encounter: Payer: Self-pay | Admitting: Obstetrics and Gynecology

## 2021-11-02 ENCOUNTER — Other Ambulatory Visit: Payer: Self-pay

## 2021-11-02 ENCOUNTER — Inpatient Hospital Stay
Admission: EM | Admit: 2021-11-02 | Discharge: 2021-11-06 | DRG: 787 | Disposition: A | Discharge status: Home / Self Care | Payer: Medicaid Other | Attending: Obstetrics & Gynecology | Admitting: Obstetrics & Gynecology

## 2021-11-02 ENCOUNTER — Ambulatory Visit: Payer: Self-pay

## 2021-11-02 DIAGNOSIS — O0933 Supervision of pregnancy with insufficient antenatal care, third trimester: Secondary | ICD-10-CM | POA: Diagnosis not present

## 2021-11-02 DIAGNOSIS — Z794 Long term (current) use of insulin: Secondary | ICD-10-CM

## 2021-11-02 DIAGNOSIS — O3663X0 Maternal care for excessive fetal growth, third trimester, not applicable or unspecified: Secondary | ICD-10-CM | POA: Diagnosis present

## 2021-11-02 DIAGNOSIS — O0993 Supervision of high risk pregnancy, unspecified, third trimester: Secondary | ICD-10-CM

## 2021-11-02 DIAGNOSIS — O99214 Obesity complicating childbirth: Secondary | ICD-10-CM | POA: Diagnosis present

## 2021-11-02 DIAGNOSIS — Z20822 Contact with and (suspected) exposure to covid-19: Secondary | ICD-10-CM | POA: Diagnosis present

## 2021-11-02 DIAGNOSIS — O99212 Obesity complicating pregnancy, second trimester: Secondary | ICD-10-CM | POA: Diagnosis present

## 2021-11-02 DIAGNOSIS — E119 Type 2 diabetes mellitus without complications: Secondary | ICD-10-CM | POA: Diagnosis present

## 2021-11-02 DIAGNOSIS — O134 Gestational [pregnancy-induced] hypertension without significant proteinuria, complicating childbirth: Secondary | ICD-10-CM | POA: Diagnosis not present

## 2021-11-02 DIAGNOSIS — Z3A37 37 weeks gestation of pregnancy: Secondary | ICD-10-CM

## 2021-11-02 DIAGNOSIS — O1414 Severe pre-eclampsia complicating childbirth: Secondary | ICD-10-CM | POA: Diagnosis present

## 2021-11-02 DIAGNOSIS — O24119 Pre-existing diabetes mellitus, type 2, in pregnancy, unspecified trimester: Secondary | ICD-10-CM | POA: Diagnosis present

## 2021-11-02 DIAGNOSIS — O24113 Pre-existing diabetes mellitus, type 2, in pregnancy, third trimester: Secondary | ICD-10-CM | POA: Diagnosis not present

## 2021-11-02 DIAGNOSIS — O1413 Severe pre-eclampsia, third trimester: Secondary | ICD-10-CM

## 2021-11-02 DIAGNOSIS — O2412 Pre-existing diabetes mellitus, type 2, in childbirth: Secondary | ICD-10-CM | POA: Diagnosis present

## 2021-11-02 LAB — RESP PANEL BY RT-PCR (FLU A&B, COVID) ARPGX2
Influenza A by PCR: NEGATIVE
Influenza B by PCR: NEGATIVE
SARS Coronavirus 2 by RT PCR: NEGATIVE

## 2021-11-02 LAB — COMPREHENSIVE METABOLIC PANEL
ALT: 13 U/L (ref 0–44)
AST: 18 U/L (ref 15–41)
Albumin: 2.5 g/dL — ABNORMAL LOW (ref 3.5–5.0)
Alkaline Phosphatase: 94 U/L (ref 38–126)
Anion gap: 8 (ref 5–15)
BUN: 11 mg/dL (ref 6–20)
CO2: 26 mmol/L (ref 22–32)
Calcium: 8.5 mg/dL — ABNORMAL LOW (ref 8.9–10.3)
Chloride: 100 mmol/L (ref 98–111)
Creatinine, Ser: 0.57 mg/dL (ref 0.44–1.00)
GFR, Estimated: >60 mL/min (ref >60)
Glucose, Bld: 193 mg/dL — ABNORMAL HIGH (ref 70–99)
Potassium: 3.4 mmol/L — ABNORMAL LOW (ref 3.5–5.1)
Sodium: 134 mmol/L — ABNORMAL LOW (ref 135–145)
Total Bilirubin: 0.3 mg/dL (ref 0.3–1.2)
Total Protein: 6.5 g/dL (ref 6.5–8.1)

## 2021-11-02 LAB — CBC
HCT: 33.2 % — ABNORMAL LOW (ref 36.0–46.0)
Hemoglobin: 10.1 g/dL — ABNORMAL LOW (ref 12.0–15.0)
MCH: 25.2 pg — ABNORMAL LOW (ref 26.0–34.0)
MCHC: 30.4 g/dL (ref 30.0–36.0)
MCV: 82.8 fL (ref 80.0–100.0)
Platelets: 279 10*3/uL (ref 150–400)
RBC: 4.01 MIL/uL (ref 3.87–5.11)
RDW: 15.4 % (ref 11.5–15.5)
WBC: 9.3 10*3/uL (ref 4.0–10.5)
nRBC: 0.2 % (ref 0.0–0.2)

## 2021-11-02 LAB — GLUCOSE, CAPILLARY
Glucose-Capillary: 103 mg/dL — ABNORMAL HIGH (ref 70–99)
Glucose-Capillary: 103 mg/dL — ABNORMAL HIGH (ref 70–99)
Glucose-Capillary: 103 mg/dL — ABNORMAL HIGH (ref 70–99)
Glucose-Capillary: 107 mg/dL — ABNORMAL HIGH (ref 70–99)
Glucose-Capillary: 108 mg/dL — ABNORMAL HIGH (ref 70–99)
Glucose-Capillary: 110 mg/dL — ABNORMAL HIGH (ref 70–99)
Glucose-Capillary: 111 mg/dL — ABNORMAL HIGH (ref 70–99)
Glucose-Capillary: 115 mg/dL — ABNORMAL HIGH (ref 70–99)
Glucose-Capillary: 127 mg/dL — ABNORMAL HIGH (ref 70–99)
Glucose-Capillary: 128 mg/dL — ABNORMAL HIGH (ref 70–99)
Glucose-Capillary: 143 mg/dL — ABNORMAL HIGH (ref 70–99)
Glucose-Capillary: 147 mg/dL — ABNORMAL HIGH (ref 70–99)
Glucose-Capillary: 150 mg/dL — ABNORMAL HIGH (ref 70–99)
Glucose-Capillary: 86 mg/dL (ref 70–99)
Glucose-Capillary: 89 mg/dL (ref 70–99)
Glucose-Capillary: 94 mg/dL (ref 70–99)

## 2021-11-02 LAB — PROTEIN / CREATININE RATIO, URINE
Creatinine, Urine: 91 mg/dL
Protein Creatinine Ratio: 0.12 mg/mg{Cre} (ref 0.00–0.15)
Total Protein, Urine: 11 mg/dL

## 2021-11-02 LAB — TYPE AND SCREEN
ABO/RH(D): A POS
Antibody Screen: NEGATIVE

## 2021-11-02 LAB — RPR: RPR Ser Ql: NONREACTIVE

## 2021-11-02 MED ORDER — LABETALOL HCL 5 MG/ML IV SOLN: 20.0000 mg | INTRAVENOUS | Status: DC | PRN

## 2021-11-02 MED ORDER — HYDRALAZINE HCL 20 MG/ML IJ SOLN: 10.0000 mg | INTRAMUSCULAR | Status: DC | PRN

## 2021-11-02 MED ORDER — OXYTOCIN-SODIUM CHLORIDE 30-0.9 UT/500ML-% IV SOLN
1.0000 m[IU]/min | INTRAVENOUS | Status: DC
  Filled 2021-11-02: qty 500

## 2021-11-02 MED ORDER — LIDOCAINE HCL (PF) 1 % IJ SOLN: 30.0000 mL | INTRAMUSCULAR | Status: DC | PRN

## 2021-11-02 MED ORDER — LABETALOL HCL 5 MG/ML IV SOLN: 80.0000 mg | INTRAVENOUS | Status: DC | PRN

## 2021-11-02 MED ORDER — LABETALOL HCL 5 MG/ML IV SOLN
20.0000 mg | INTRAVENOUS | Status: DC | PRN
  Administered 2021-11-02 (×2): 20 mg via INTRAVENOUS
  Filled 2021-11-02 (×2): qty 4

## 2021-11-02 MED ORDER — SOD CITRATE-CITRIC ACID 500-334 MG/5ML PO SOLN: 30.0000 mL | ORAL | Status: DC | PRN

## 2021-11-02 MED ORDER — TERBUTALINE SULFATE 1 MG/ML IJ SOLN: 0.2500 mg | Freq: Once | INTRAMUSCULAR | Status: DC | PRN

## 2021-11-02 MED ORDER — MISOPROSTOL 200 MCG PO TABS
ORAL_TABLET | ORAL | Status: AC
  Administered 2021-11-02: 11:00:00 25 ug via VAGINAL
  Filled 2021-11-02: qty 4

## 2021-11-02 MED ORDER — LABETALOL HCL 5 MG/ML IV SOLN: 40.0000 mg | INTRAVENOUS | Status: DC | PRN

## 2021-11-02 MED ORDER — MAGNESIUM SULFATE 40 GM/1000ML IV SOLN
2.0000 g/h | INTRAVENOUS | Status: DC
  Administered 2021-11-03 – 2021-11-05 (×3): 2 g/h via INTRAVENOUS
  Filled 2021-11-02 (×4): qty 1000

## 2021-11-02 MED ORDER — DEXTROSE 50 % IV SOLN: 0.0000 mL | INTRAVENOUS | Status: DC | PRN

## 2021-11-02 MED ORDER — OXYTOCIN BOLUS FROM INFUSION: 333.0000 mL | Freq: Once | INTRAVENOUS | Status: DC

## 2021-11-02 MED ORDER — INSULIN REGULAR(HUMAN) IN NACL 100-0.9 UT/100ML-% IV SOLN
INTRAVENOUS | Status: DC
  Administered 2021-11-02: 06:00:00 8 [IU]/h via INTRAVENOUS
  Administered 2021-11-04: 1.3 [IU]/h via INTRAVENOUS
  Filled 2021-11-02 (×2): qty 100

## 2021-11-02 MED ORDER — ONDANSETRON HCL 4 MG/2ML IJ SOLN
4.0000 mg | Freq: Four times a day (QID) | INTRAMUSCULAR | Status: DC | PRN
  Administered 2021-11-04: 4 mg via INTRAVENOUS
  Filled 2021-11-02: qty 2

## 2021-11-02 MED ORDER — DEXTROSE IN LACTATED RINGERS 5 % IV SOLN: INTRAVENOUS | Status: DC

## 2021-11-02 MED ORDER — MAGNESIUM SULFATE BOLUS VIA INFUSION
4.0000 g | Freq: Once | INTRAVENOUS | Status: AC
  Administered 2021-11-02: 13:00:00 4 g via INTRAVENOUS
  Filled 2021-11-02: qty 1000

## 2021-11-02 MED ORDER — OXYTOCIN-SODIUM CHLORIDE 30-0.9 UT/500ML-% IV SOLN
2.5000 [IU]/h | INTRAVENOUS | Status: DC
  Administered 2021-11-04: 500 [IU]/h via INTRAVENOUS

## 2021-11-02 MED ORDER — LACTATED RINGERS IV SOLN: INTRAVENOUS | Status: DC

## 2021-11-02 MED ORDER — AMMONIA AROMATIC IN INHA
RESPIRATORY_TRACT | Status: AC
  Filled 2021-11-02: qty 10

## 2021-11-02 MED ORDER — OXYTOCIN 10 UNIT/ML IJ SOLN
INTRAMUSCULAR | Status: AC
  Filled 2021-11-02: qty 2

## 2021-11-02 MED ORDER — CALCIUM GLUCONATE 10 % IV SOLN
INTRAVENOUS | Status: AC
  Filled 2021-11-02: qty 10

## 2021-11-02 MED ORDER — INSULIN REGULAR(HUMAN) IN NACL 100-0.9 UT/100ML-% IV SOLN
INTRAVENOUS | Status: DC
  Filled 2021-11-02: qty 100

## 2021-11-02 MED ORDER — MISOPROSTOL 25 MCG QUARTER TABLET
25.0000 ug | ORAL_TABLET | ORAL | Status: DC
  Administered 2021-11-02 – 2021-11-03 (×3): 25 ug via BUCCAL
  Filled 2021-11-02 (×5): qty 1

## 2021-11-02 MED ORDER — LIDOCAINE HCL (PF) 1 % IJ SOLN
INTRAMUSCULAR | Status: AC
  Filled 2021-11-02: qty 30

## 2021-11-02 MED ORDER — BUTORPHANOL TARTRATE 1 MG/ML IJ SOLN: 2.0000 mg | INTRAMUSCULAR | Status: DC | PRN

## 2021-11-02 MED ORDER — LACTATED RINGERS IV SOLN
500.0000 mL | INTRAVENOUS | Status: DC | PRN
Start: 2021-11-02 — End: 2021-11-04

## 2021-11-02 MED ORDER — ACETAMINOPHEN 325 MG PO TABS
650.0000 mg | ORAL_TABLET | ORAL | Status: DC | PRN
  Administered 2021-11-03: 650 mg via ORAL
  Filled 2021-11-02: qty 2

## 2021-11-02 MED ORDER — MISOPROSTOL 25 MCG QUARTER TABLET
25.0000 ug | ORAL_TABLET | ORAL | Status: DC | PRN
  Administered 2021-11-02 – 2021-11-03 (×6): 25 ug via VAGINAL
  Filled 2021-11-02 (×7): qty 1

## 2021-11-02 NOTE — Progress Notes (Signed)
Robin Houston is a 25 y.o. G3P0020 at [redacted]w[redacted]d who continues with IOL.  Subjective:she can now feel occasional contractions. Her mother is present at bedside.   Objective: BP (!) 154/88 (BP Location: Left Arm)   Pulse 92   Temp 98.1 F (36.7 C) (Oral)   Resp 20   Ht 5\' 5"  (1.651 m)   Wt (!) 165.1 kg   LMP 11/17/2020 (Exact Date)   SpO2 97%   BMI 60.57 kg/m  No intake/output data recorded. Total I/O In: 240 [P.O.:240] Out: 700 [Urine:700]2  FHT:  FHR: 135 baseline bpm, variability: moderate,  accelerations:  Present,  decelerations:  Absent UC:   irregular, every 7 -10 minutes SVE:   Dilation: Closed Effacement (%): 50 Station: Ballotable Exam by:: [email or web address] CNM Bishop score is 2  Labs: Lab Results  Component Value Date   WBC 9.3 11/02/2021   HGB 10.1 (L) 11/02/2021   HCT 33.2 (L) 11/02/2021   MCV 82.8 11/02/2021   PLT 279 11/02/2021    Assessment / Plan: Induction of labor due to gestational hypertension and diabetes type 2 Ongoing cervical ripening  Labor:  cervical ripening continues Preeclampsia:  not pre eclamptic, but gestational HTN- now on magnesium for severe blood pressures  Fetal Wellbeing:  Category I Pain Control:  Labor support without medications I/D:  n/a Anticipated MOD:   for trial of labor. EFW is >98% per last ultrasound  11/04/2021 11/02/2021, 3:19 PM

## 2021-11-02 NOTE — Progress Notes (Signed)
Robin Houston is a 25 y.o. G3P0020 at 107w0d who continues with an IOL for Diabetes, LGA fetus and who has now developed hypertension and is on magnesium sulphate infusion for seizure prophylaxis. She has had several severe range blood pressures and has been treated with IV Labetalol.  Subjective: Robin Houston can now feel some mild contractions. Hr baby has been moving well. She denies any headache or visual disturbances.   Objective: BP (!) 145/87 (BP Location: Left Arm)   Pulse 92   Temp 98.1 F (36.7 C) (Oral)   Resp 18   Ht 5\' 5"  (1.651 m)   Wt (!) 165.1 kg   LMP 11/17/2020 (Exact Date)   SpO2 94%   BMI 60.57 kg/m  I/O last 3 completed shifts: In: 2742 [P.O.:840; I.V.:1902] Out: 1500 [Urine:1500] Total I/O In: 575.5 [P.O.:120; I.V.:455.5] Out: 1145 [Urine:1145]  FHT:  FHR: 130 baseline bpm, variability: moderate,  accelerations:  Present,  decelerations:  Absent UC:   regular, every 4-5 minutes SVE:   Dilation: Closed Effacement (%): 50 Station: Ballotable Exam by:: [email or web address] Guptill RN  Labs: Lab Results  Component Value Date   WBC 9.3 11/02/2021   HGB 10.1 (L) 11/02/2021   HCT 33.2 (L) 11/02/2021   MCV 82.8 11/02/2021   PLT 279 11/02/2021    Assessment / Plan: IUP 37 weeks IOL for Diabetes Type 2 and Gestational hypertension, continues with cervical ripening Higher risk pregnancy. Category 1 FHTS.  Labor:  Little cervical change noted on this Day 1 IOL Preeclampsia:  on magnesium sulfate and no signs or symptoms of toxicity Fetal Wellbeing:  Category I Pain Control:  Labor support without medications I/D:  n/a Anticipated MOD:   Trial of labor  11/04/2021 11/02/2021, 9:52 PM

## 2021-11-02 NOTE — Progress Notes (Addendum)
RN at bedside for shift report. RN attempting to apply Maxine Glenn to obtain a fetal heart rate tracing from 0702 until 0748. External wireless monitors applied at 0748 and RN able to obtain a tracing. Pt denies any pain. Pt had one severe range pressure at 0751 but repeat was not severe. CNM made aware.

## 2021-11-02 NOTE — H&P (Signed)
OB History & Physical   History of Present Illness:  Chief Complaint: Induction of labor of diabetes  HPI:  Robin Houston is a 25 y.o. G3P0020 female at 69w0ddated by 11 week ultrasound .  Her pregnancy has been complicated by obesity with initial BMI 49 with 69 pound weight gain, T2DM on insulin, elevated BPs intermittently without a diagnosis of hypertensive disorder, EFW 98th%ile on last growth scan .    She denies contractions.   She denies leakage of fluid.   She denies vaginal bleeding.   She reports fetal movement.    Total weight gain for pregnancy: 31.3 kg   Obstetrical Problem List: May 2022 Problems (from 04/08/21 to present)     Problem Noted Resolved   Obesity affecting pregnancy in second trimester 04/15/2021 by HGae Dry MD No   Overview Signed 04/15/2021 11:16 AM by HGae Dry MD    BMI >=40 '[ ]'  screen sleep apnea '[ ]'  anesthesia consult (early and late if BMI > 45) [x ] u/s for dating [x ]  [x ] nutritional goals [x ] folic acid 145m'[ ]'  bASA (>12 weeks) '[ ]'  consider nutrition consult '[ ]'  consider maternal EKG 1st trimester '[ ]'  Growth u/s 28 '[ ]' , 32 '[ ]' , 36 weeks '[ ]'  '[ ]'  NST/AFI weekly 34+ weeks (34'[]' ,35'[]' ,36'[]' , 37'[]' , 38'[]' , 39'[]' , 40'[]' ) '[ ]'  IOL by 41 weeks (scheduled, prn '[]' )       Pregnancy complicated by pre-existing type 2 diabetes 04/15/2021 by HaGae DryMD No   Overview Signed 04/15/2021 11:15 AM by HaGae DryMD    Current Diabetic Medications:  Metformin  '[ ]'  Aspirin 81 mg daily after 12 weeks; discontinue after 36 weeks (? A2/B GDM)  For A2/B GDM or higher classes of DM '[ ]'  Diabetes Education and Testing Supplies '[ ]'  Nutrition Counsult '[ ]'  Fetal ECHO after 22-24 weeks  '[ ]'  Eye exam for retina evaluation  '[ ]'  Baseline EKG '[ ]'  [place] growth every 4 weeks starting at 28 weeks '[ ]'  Twice weekly NST starting at [redacted] weeks gestation '[ ]'  Delivery planning contingent on fetal growth, AFI, glycemic control, and other co-morbidities  but at least by 39 weeks  Baseline and surveillance labs [facility]  Lab Results  Component Value Date   CREATININE 0.70 07/24/2020   AST 15 03/31/2020   ALT 12 03/31/2020   Lab Results  Component Value Date   HGBA1C 6.6 (H) 03/31/2020   Antenatal Testing Class of DM U/S NST/AFI DELIVERY  Diabetes   A1 - good control - O24.410    A2 - good control - O24.419      A2  - poor control or poor compliance - O24.419, E11.65   (Macrosomia or polyhydramnios) **E11.65 is extra code for poor control**    A2/B - O24.919  and B-C O24.319  Poor control B-C or D-R-F-T - O24.319  or  Type I DM - O24.019  20-38  20-38  20-24-28-32-36   20-24-28-32-35-38//fetal echo  20-24-27-30-33-36-38//fetal echo  40  32//2 x wk  32//2 x wk   32//2 x wk  28//BPP wkly then 32//2 x wk  40  39  PRN   39  PRN          Supervision of high-risk pregnancy 04/08/2021 by ScHomero FellersMD No   Overview Addendum 10/01/2021  4:20 PM by ScHomero FellersMD     Nursing Staff  Provider  Office Location  [facility] Dating  [place] at 6 wks  Language  English Anatomy US  MFM 19 wks complete ECHO 22 wks [normal ]  Flu Vaccine  09/08/2021 Genetic Screen  NIPS:nml XY   TDaP vaccine   09/08/2021 Hgb A1C or  GTT GDMB   Covid vaccinated   LAB RESULTS   Rhogam  n/a Blood Type A/Positive/-- (05/04 1445)   Feeding Plan breast Antibody Negative (05/04 1445)  Contraception POP Rubella 1.03 (05/04 1445)  Circumcision  RPR Non Reactive (05/04 1445)   Pediatrician   HBsAg Negative (05/04 1445)   Support Person Husband HIV Non Reactive (05/04 1445)  Prenatal Classes  discussed Varicella Immune    GBS  (For PCN allergy, check sensitivities)   BTL Consent     VBAC Consent n/a Pap  04/08/2021    Hgb Electro         Obesity in pregnancy: pregravid BMI: 49 Type 2 diabetes in pregnancy 07/10/2021 increased to:   Lantus 20 units Novolog 6 units with meals 08/11/2021  increased to: Lantus 20 units Novolog 6 units with breakfast, lunch 8 units with dinner 10/01/2021 increased to: Lantus 25 units NovoLog 6 units with breakfast, 10 units with lunch and dinner           Maternal Medical History:   Past Medical History:  Diagnosis Date   Anemia    Diabetes mellitus without complication ([place])    type 2    Past Surgical History:  Procedure Laterality Date   no surgical history      No Known Allergies  Prior to Admission medications   Medication Sig Start Date End Date Taking? Authorizing Provider  aspirin EC 81 MG tablet Take 81 mg by mouth daily. Swallow whole.   Yes [provider]  ferrous sulfate 325 (65 FE) MG EC tablet Take 325 mg by mouth 3 (three) times daily with meals.   Yes [provider]  insulin aspart (NOVOLOG) 100 UNIT/ML FlexPen 6 units with breakfast, 10 units with lunch and dinner 10/01/21  Yes Schuman, Christanna R, MD  metFORMIN (GLUCOPHAGE) 1000 MG tablet TAKE 1 TABLET (1,000 MG TOTAL) BY MOUTH 2 (TWO) TIMES DAILY WITH A MEAL. 07/30/21  Yes Gae Dry, MD  Prenatal Vit-Fe Fumarate-FA (MULTIVITAMIN-PRENATAL) 27-0.8 MG TABS tablet Take 1 tablet by mouth daily at 12 noon.   Yes [provider]  Accu-Chek Softclix Lancets lancets 1 each by Other route 4 (four) times daily. 05/01/21   Gae Dry, MD  Blood Glucose Monitoring Suppl (ACCU-CHEK GUIDE) w/Device KIT 1 KIT BY SUBDERMAL ROUTE AS DIRECTED. CHECK BLOOD SUGARS FOR FASTING, AND TWO HOURS AFTER BREAKFAST, LUNCH AND DINNER (4 CHECKS DAILY) 05/01/21   Gae Dry, MD  glucose blood test strip Use four times daily as instructed 05/01/21   Gae Dry, MD  insulin glargine (LANTUS) 100 UNIT/ML Solostar Pen Inject 25 Units into the skin at bedtime. 10/01/21   Schuman, Stefanie Libel, MD  Insulin Pen Needle 31G X 8 MM MISC 1 each by Does not apply route in the morning, at noon, in the evening, and at bedtime. 06/09/21   Homero Fellers, MD     OB History  Gravida Para Term Preterm AB Living  3       2    SAB IAB Ectopic Multiple Live Births  2            # Outcome Date GA Lbr Len/2nd Weight Sex Delivery  Anes PTL Lv  3 Current           2 SAB 07/22/20          1 SAB 02/2020            Prenatal care site: [facility]  Social History: She  reports that she has never smoked. She has never used smokeless tobacco. She reports that she does not drink alcohol and does not use drugs.  Family History: family history includes Diabetes in her father and paternal grandmother; Thyroid disease in her mother.   Review of Systems  Constitutional: Negative.   HENT: Negative.    Eyes: Negative.   Respiratory: Negative.    Cardiovascular: Negative.   Gastrointestinal: Negative.   Genitourinary: Negative.   Musculoskeletal: Negative.   Skin: Negative.   Neurological: Negative.   Psychiatric/Behavioral: Negative.      Physical Exam:  LMP 11/17/2020 (Exact Date)   Physical Exam Constitutional:      General: She is not in acute distress.    Appearance: Normal appearance. She is well-developed. She is obese.  HENT:     Head: Normocephalic and atraumatic.  Eyes:     General: No scleral icterus.    Conjunctiva/sclera: Conjunctivae normal.  Cardiovascular:     Rate and Rhythm: Normal rate and regular rhythm.     Heart sounds: No murmur heard.   No friction rub. No gallop.  Pulmonary:     Effort: Pulmonary effort is normal. No respiratory distress.     Breath sounds: Normal breath sounds. No wheezing or rales.  Abdominal:     General: Bowel sounds are normal. There is no distension.     Palpations: Abdomen is soft. There is mass (gravid, NT).     Tenderness: There is no abdominal tenderness. There is no guarding or rebound.  Musculoskeletal:        General: Normal range of motion.     Cervical back: Normal range of motion and neck supple.  Neurological:     General: No focal deficit present.     Mental Status: She is  alert and oriented to person, place, and time.     Cranial Nerves: No cranial nerve deficit.  Skin:    General: Skin is warm and dry.     Findings: No erythema.  Psychiatric:        Mood and Affect: Mood normal.        Behavior: Behavior normal.        Judgment: Judgment normal.     Baseline FHR: 140 beats/min   Variability: moderate   Accelerations: present   Decelerations: absent Contractions: absent frequency: n/a Overall assessment: cat 1 (Limited amount of tracing time)  Bedside Ultrasound:  Number of Fetus: 1  Presentation: cephalic  Fluid: not assessed (was 25.1 cm on 11/18)  Placental Location: anterior  Lab Results  Component Value Date   [place] NEGATIVE 03/31/2020  Pending for this admission  Assessment:  Robin Houston is a 25 y.o. G76P0020 female at 61w0dwith T2DM, morbid obesity, elevated blood pressure, here for induction of labor.   Plan:  Admit to Labor & Delivery  CBC, T&S, Clrs, IVF GBS negative on 10/28/2021   Fetwal well-being: reassuring overall so far T2DM: endotool for glucose monitoring, insulin gtt as indicated.  Monitor BPs, preeclampsia labs.  Treat as indicated IOL: anticipate cervical ripening followed by pitocin. All questions answered.  SPrentice Docker MD 11/02/2021 5:32 AM

## 2021-11-02 NOTE — Progress Notes (Signed)
RN's at bedside continuously trying to adjust monitor for fetal tracing without success of a continuous NST. Provider made aware.

## 2021-11-02 NOTE — Progress Notes (Addendum)
Robin Houston is a 25 y.o. G3P0020 at [redacted]w[redacted]d by ultrasound admitted for induction of labor due to Diabetes and the anticipation of an LGA baby.Her prenatal course is marked by late to care at 21 weeks (dating via late sono), morbid obesity, Type 2 DM, and per a recent ultrasound, some polyhydramnios.  Subjective:She is comfortable. Does not feel any contractions or cramping. She is feeling plenty of fetal movement. She denies any visual changes or headahces.she verbalizes that she is being induced due to her pregnancy being higher risk.   Objective: BP (!) 159/96   Pulse 89   Temp 98.2 F (36.8 C) (Oral)   Resp 18   Ht 5\' 5"  (1.651 m)   Wt (!) 165.1 kg   LMP 11/17/2020 (Exact Date)   SpO2 96%   BMI 60.57 kg/m  No intake/output data recorded. No intake/output data recorded.  FHT:  FHR: 130s bpm, variability: moderate,  accelerations:  Present,  decelerations:  Absent UC:   rare, occasional SVE:   Dilation: Closed Effacement (%): 50 Station: Ballotable Exam by:: [email or web address] CNM Bishop score:2  Outlet of the Pelvis is narrow per this provider  Labs: Lab Results  Component Value Date   WBC 9.3 11/02/2021   HGB 10.1 (L) 11/02/2021   HCT 33.2 (L) 11/02/2021   MCV 82.8 11/02/2021   PLT 279 11/02/2021    Assessment / Plan: Induction of labor due to gestational diabetes type 2,  progressing well on pitocin Elevated blood pressures noted. PIH labs are WNL ? CHTN?  Labor:  cervical ripening now  Fetal Wellbeing:  Category I Pain Control:  Labor support without medications I/D:  n/a Anticipated MOD:   for Trial of labor. Cytotec 11/04/2021 placed by the cervix. Will recheck in 4 hours; consider buccal as well as vaginal Cytotec with next dosing.  11/02/2021, 10:50 AM

## 2021-11-02 NOTE — Progress Notes (Signed)
Provider aware of pt BP's 150's/90's and verbal order given for labetalol order set and to initiate with severe range pressure.

## 2021-11-02 NOTE — Telephone Encounter (Signed)
Office closed 10/30/21. Patient currently admitted.

## 2021-11-02 NOTE — Progress Notes (Addendum)
Inpatient Diabetes Program Recommendations  Diabetes Treatment Program Recommendations  ADA Standards of Care  Diabetes in Pregnancy Target Glucose Ranges:  Fasting: 60 - 90 mg/dL Preprandial: 60 - 694 mg/dL 1 hr postprandial: Less than 140mg /dL (from first bite of meal) 2 hr postprandial: Less than 120 mg/dL (from first bite of meal)     Latest Reference Range & Units 11/02/21 07:34 11/02/21 08:37 11/02/21 09:36  Glucose-Capillary 70 - 99 mg/dL 11/04/21 (H) 854 (H) 627 (H)    Latest Reference Range & Units 11/02/21 05:42  Glucose 70 - 99 mg/dL 11/04/21 (H)   Review of Glycemic Control  Diabetes history: DM2; 37W gestation Outpatient Diabetes medications: Lantus 25 units QHS, Novolog 6 units with breakfast, Novolog 10 units with lunch and supper, Metformin 1000 mg BID Current orders for Inpatient glycemic control: IV insulin  Inpatient Diabetes Program Recommendations:    Insulin: Recommend IV insulin be continued until after delivery. Once delivered and provider is ready to discontinue IV insulin, recommend ordering CBGs AC&HS with Novolog 0-9 units TID with meals, Novolog 0-5 units QHS, and Metformin 1000 mg BID.  NOTE: Per chart, patient with DM2 hx and was prescribed Metformin 1000 mg BID prior to pregnancy. Patient admitted today for induction and is 37W gestation. Initial glucose 193 mg/dl and patient was started on IV insulin. Agree with using IV insulin until after delivery. Once delivered and provider is ready to transition off IV insulin would recommend CBGs AC&HS, Novolog 0-9 units TID, Novolog 0-5 units QHS, and Metformin 1000 mg BID. Will follow along.  Thanks, 009, RN, MSN, CDE Diabetes Coordinator Inpatient Diabetes Program [phone] (Team Pager from 8am to 5pm)

## 2021-11-02 NOTE — Progress Notes (Signed)
Robin Houston is a 25 y.o. G3P0020 at [redacted]w[redacted]d who continues with her IOL at 37 weeks. She iscontinuing with cervical ripening with cytotec  Subjective:She denies any headache of vision changes. She is not feeling contractions or cramping.   Objective: BP (!) 159/108 (BP Location: Left Arm)   Pulse 85   Temp 98.2 F (36.8 C) (Oral)   Resp 18   Ht 5\' 5"  (1.651 m)   Wt (!) 165.1 kg   LMP 11/17/2020 (Exact Date)   SpO2 96%   BMI 60.57 kg/m  No intake/output data recorded. No intake/output data recorded.  FHT:  FHR: 130 baseline bpm, variability: moderate,  accelerations:  Present,  decelerations:  Absent UC:   some uterine irritability and occasional contractions SVE:   Dilation: Closed Effacement (%): 50 Station: Ballotable Exam by:: [email or web address] CNM  Labs: Lab Results  Component Value Date   WBC 9.3 11/02/2021   HGB 10.1 (L) 11/02/2021   HCT 33.2 (L) 11/02/2021   MCV 82.8 11/02/2021   PLT 279 11/02/2021    Assessment / Plan: Induction of labor due to [place] 2 DM, now developed elevated blood pressures.  Labor:  continues with cytotec dosing Gestational hypertension- will start magnesium sulphate for seizure prophylaxis Fetal Wellbeing:  Category I Pain Control:  Labor support without medications I/D:  n/a Anticipated MOD:   Trial of labor   11/04/2021 11/02/2021, 12:51 PM

## 2021-11-03 LAB — GLUCOSE, CAPILLARY
Glucose-Capillary: 101 mg/dL — ABNORMAL HIGH (ref 70–99)
Glucose-Capillary: 101 mg/dL — ABNORMAL HIGH (ref 70–99)
Glucose-Capillary: 102 mg/dL — ABNORMAL HIGH (ref 70–99)
Glucose-Capillary: 107 mg/dL — ABNORMAL HIGH (ref 70–99)
Glucose-Capillary: 109 mg/dL — ABNORMAL HIGH (ref 70–99)
Glucose-Capillary: 111 mg/dL — ABNORMAL HIGH (ref 70–99)
Glucose-Capillary: 111 mg/dL — ABNORMAL HIGH (ref 70–99)
Glucose-Capillary: 116 mg/dL — ABNORMAL HIGH (ref 70–99)
Glucose-Capillary: 117 mg/dL — ABNORMAL HIGH (ref 70–99)
Glucose-Capillary: 123 mg/dL — ABNORMAL HIGH (ref 70–99)
Glucose-Capillary: 124 mg/dL — ABNORMAL HIGH (ref 70–99)
Glucose-Capillary: 130 mg/dL — ABNORMAL HIGH (ref 70–99)
Glucose-Capillary: 89 mg/dL (ref 70–99)
Glucose-Capillary: 94 mg/dL (ref 70–99)
Glucose-Capillary: 97 mg/dL (ref 70–99)
Glucose-Capillary: 97 mg/dL (ref 70–99)

## 2021-11-03 LAB — CBC
HCT: 35.6 % — ABNORMAL LOW (ref 36.0–46.0)
Hemoglobin: 11.2 g/dL — ABNORMAL LOW (ref 12.0–15.0)
MCH: 25.6 pg — ABNORMAL LOW (ref 26.0–34.0)
MCHC: 31.5 g/dL (ref 30.0–36.0)
MCV: 81.3 fL (ref 80.0–100.0)
Platelets: 253 10*3/uL (ref 150–400)
RBC: 4.38 MIL/uL (ref 3.87–5.11)
RDW: 15.9 % — ABNORMAL HIGH (ref 11.5–15.5)
WBC: 9.2 10*3/uL (ref 4.0–10.5)
nRBC: 0 % (ref 0.0–0.2)

## 2021-11-03 MED ORDER — LABETALOL HCL 200 MG PO TABS
300.0000 mg | ORAL_TABLET | Freq: Two times a day (BID) | ORAL | Status: DC
  Administered 2021-11-03 – 2021-11-06 (×6): 300 mg via ORAL
  Filled 2021-11-03: qty 1
  Filled 2021-11-03 (×3): qty 3
  Filled 2021-11-03 (×2): qty 1
  Filled 2021-11-03: qty 3

## 2021-11-03 MED ORDER — LABETALOL HCL 100 MG PO TABS
ORAL_TABLET | ORAL | Status: AC
  Filled 2021-11-03: qty 3

## 2021-11-03 MED ORDER — OXYTOCIN-SODIUM CHLORIDE 30-0.9 UT/500ML-% IV SOLN
1.0000 m[IU]/min | INTRAVENOUS | Status: DC
  Administered 2021-11-03: 2 m[IU]/min via INTRAVENOUS

## 2021-11-03 NOTE — Progress Notes (Signed)
Subjective:  No concerns.  Feeling irregular contractions.    Objective:   Vitals: Blood pressure 122/66, pulse 97, temperature 98 F (36.7 C), temperature source Oral, resp. rate 18, height 5\' 5"  (1.651 m), weight (!) 165.1 kg, last menstrual period 11/17/2020, SpO2 98 %. General:  Abdomen: Cervical Exam:  Dilation: 1.5 Effacement (%): 60 Cervical Position: Anterior Station: -3 Presentation: Vertex Exam by:: Khalidah Herbold MD Soft  FHT: 130, moderate, +accels, no decels  Toco:q3-17min  Results for orders placed or performed during the hospital encounter of 11/02/21 (from the past 24 hour(s))  Glucose, capillary     Status: Abnormal   Collection Time: 11/02/21  2:48 PM  Result Value Ref Range   Glucose-Capillary 103 (H) 70 - 99 mg/dL  Glucose, capillary     Status: None   Collection Time: 11/02/21  3:44 PM  Result Value Ref Range   Glucose-Capillary 89 70 - 99 mg/dL  Glucose, capillary     Status: Abnormal   Collection Time: 11/02/21  4:46 PM  Result Value Ref Range   Glucose-Capillary 127 (H) 70 - 99 mg/dL  Glucose, capillary     Status: Abnormal   Collection Time: 11/02/21  5:49 PM  Result Value Ref Range   Glucose-Capillary 103 (H) 70 - 99 mg/dL  Glucose, capillary     Status: Abnormal   Collection Time: 11/02/21  7:12 PM  Result Value Ref Range   Glucose-Capillary 147 (H) 70 - 99 mg/dL  Glucose, capillary     Status: Abnormal   Collection Time: 11/02/21  8:17 PM  Result Value Ref Range   Glucose-Capillary 107 (H) 70 - 99 mg/dL  Glucose, capillary     Status: Abnormal   Collection Time: 11/02/21  9:30 PM  Result Value Ref Range   Glucose-Capillary 103 (H) 70 - 99 mg/dL  Glucose, capillary     Status: None   Collection Time: 11/02/21 10:28 PM  Result Value Ref Range   Glucose-Capillary 94 70 - 99 mg/dL  Glucose, capillary     Status: Abnormal   Collection Time: 11/02/21 11:31 PM  Result Value Ref Range   Glucose-Capillary 110 (H) 70 - 99 mg/dL  Glucose,  capillary     Status: None   Collection Time: 11/03/21 12:30 AM  Result Value Ref Range   Glucose-Capillary 94 70 - 99 mg/dL  Glucose, capillary     Status: Abnormal   Collection Time: 11/03/21  2:36 AM  Result Value Ref Range   Glucose-Capillary 109 (H) 70 - 99 mg/dL  Glucose, capillary     Status: Abnormal   Collection Time: 11/03/21  4:31 AM  Result Value Ref Range   Glucose-Capillary 116 (H) 70 - 99 mg/dL  Glucose, capillary     Status: Abnormal   Collection Time: 11/03/21  6:40 AM  Result Value Ref Range   Glucose-Capillary 111 (H) 70 - 99 mg/dL  Glucose, capillary     Status: Abnormal   Collection Time: 11/03/21  8:36 AM  Result Value Ref Range   Glucose-Capillary 102 (H) 70 - 99 mg/dL  Glucose, capillary     Status: Abnormal   Collection Time: 11/03/21 10:28 AM  Result Value Ref Range   Glucose-Capillary 124 (H) 70 - 99 mg/dL  Glucose, capillary     Status: Abnormal   Collection Time: 11/03/21 11:33 AM  Result Value Ref Range   Glucose-Capillary 130 (H) 70 - 99 mg/dL  Glucose, capillary     Status: Abnormal   Collection Time:  11/03/21 12:30 PM  Result Value Ref Range   Glucose-Capillary 123 (H) 70 - 99 mg/dL  Glucose, capillary     Status: Abnormal   Collection Time: 11/03/21  1:32 PM  Result Value Ref Range   Glucose-Capillary 117 (H) 70 - 99 mg/dL    Assessment:   25 y.o. G3P0020 [redacted]w[redacted]d   Plan:   1) Labor  - foley catheter placed - start pitocin - Will plan on internal monitors once able to AROM  2) Fetus - cat I tracing  3) GHTN vs preeclampsia with severe features based on BP criteria, has some mild range BP noted outside of pregnancy but mostly normotensive - continue magnesium sulfate - laboratory work up negative, will need repeat labs prior to epidural  4) GDM  - EFW 5lbs 12oz on 10/02/2021 her EFW if normal growth would be around 7lbs 12oz assuming normal growth - continue endotool  Vena Austria, MD, [name], [facility] 11/03/2021, 2:08 PM

## 2021-11-03 NOTE — Progress Notes (Signed)
Robin Houston is a 25 y.o. G3P0020 at [redacted]w[redacted]d now on Day #2 of induction for gestational hypertension and diabetes Type 2. She has been receiving vaginal Cytotec. Continues on magnesium sulphate for seizure prophylaxis. She denies headache or visual disturbance.  Subjective: She is feeling contractions, and describes them as mild. Has been able to sleep during the night.  Objective: BP (!) 152/91   Pulse 99   Temp 98.2 F (36.8 C) (Oral)   Resp 15   Ht 5\' 5"  (1.651 m)   Wt (!) 165.1 kg   LMP 11/17/2020 (Exact Date)   SpO2 95%   BMI 60.57 kg/m  I/O last 3 completed shifts: In: 2742 [P.O.:840; I.V.:1902] Out: 1500 [Urine:1500] Total I/O In: 2142.2 [P.O.:480; I.V.:1662.2] Out: 3310 [Urine:3310]  FHT:  FHR: 130 baseline bpm, variability: moderate,  accelerations:  Present,  decelerations:  Absent UC:   regular, every 4 minutes SVE:   Dilation: 1.5 Effacement (%): 50 Station: Ballotable Exam by:: [email or web address] CNM Bishop score: 4  Labs: Lab Results  Component Value Date   WBC 9.3 11/02/2021   HGB 10.1 (L) 11/02/2021   HCT 33.2 (L) 11/02/2021   MCV 82.8 11/02/2021   PLT 279 11/02/2021    Assessment / Plan: Induction of labor due to gestational hypertension and type 2 diabetes  Labor:  ongoing cervical ripening. Anticipate pitocin once Bishop score is more favorable.  on magnesium sulfate/ GHTN Fetal Wellbeing:  Category I Pain Control:  Labor support without medications I/D:  n/a Anticipated MOD:   Trial of labor cytotec 11/04/2021 placed by the cervix at approximately 0600. Will recheck in 4 hours.  11/03/2021, 6:01 AM

## 2021-11-03 NOTE — Progress Notes (Signed)
Discussed epidural placement with patient. Patient states she would like to receive it at 6 cm.

## 2021-11-03 NOTE — Progress Notes (Signed)
  Labor Progress Note   25 y.o. [id number] @ [redacted]w[redacted]d , admitted for  Pregnancy, Labor Management. IOL GHTN, T2DM/insulin  Subjective:  She is currently comfortable, not feeling contractions. She denies headache, visual changes or epigastric pain.  Objective:  BP (!) 154/95   Pulse 100   Temp 97.9 F (36.6 C) (Oral)   Resp 20   Ht 5\' 5"  (1.651 m)   Wt (!) 165.1 kg   LMP 11/17/2020 (Exact Date)   SpO2 (!) 88%   BMI 60.57 kg/m  Abd: gravid, ND, FHT present, mild tenderness on exam Extr: trace to 1+ bilateral pedal edema SVE: CERVIX: 1.5 cm dilated, 60 effaced, -3 station, cervical sweep  EFM: FHR: 130 bpm, variability: moderate,  accelerations:  Present,  decelerations:  Absent Toco: Frequency: Every 1-8 minutes Labs: I have reviewed the patient's lab results.   Assessment & Plan:  11/19/2020 @ [redacted]w[redacted]d, admitted for  Pregnancy and Labor/Delivery Management  1. Pain management: none. 2. FWB: FHT category I.  3. ID: GBS negative 4. Labor management: buccal cytotec given, vaginal cytotec placed 5. GHTN with severe range BPs: continue magnesium sulfate, start PO Labetalol 300 mg BID 6. T2 DM: continue Endo Tool   All discussed with patient, see orders  [redacted]w[redacted]d, CNM [facility] 11/03/2021  10:07 AM

## 2021-11-03 NOTE — Progress Notes (Signed)
Subjective:  Feeling increasing strength of contractions  Objective:   Vitals: Blood pressure (!) 142/80, pulse 88, temperature 97.9 F (36.6 C), temperature source Oral, resp. rate 18, height 5\' 5"  (1.651 m), weight (!) 165.1 kg, last menstrual period 11/17/2020, SpO2 96 %. General: NAD Abdomen: gravid, non-tender Cervical Exam:  Dilation: 1.5 Effacement (%): 60 Cervical Position: Anterior Station: -3 Presentation: Vertex Exam by:: Jennalynn Rivard MD  FHT: 125, moderate, +accles, no decels Toco:q1-54min  Results for orders placed or performed during the hospital encounter of 11/02/21 (from the past 24 hour(s))  Glucose, capillary     Status: Abnormal   Collection Time: 11/02/21  5:49 PM  Result Value Ref Range   Glucose-Capillary 103 (H) 70 - 99 mg/dL  Glucose, capillary     Status: Abnormal   Collection Time: 11/02/21  7:12 PM  Result Value Ref Range   Glucose-Capillary 147 (H) 70 - 99 mg/dL  Glucose, capillary     Status: Abnormal   Collection Time: 11/02/21  8:17 PM  Result Value Ref Range   Glucose-Capillary 107 (H) 70 - 99 mg/dL  Glucose, capillary     Status: Abnormal   Collection Time: 11/02/21  9:30 PM  Result Value Ref Range   Glucose-Capillary 103 (H) 70 - 99 mg/dL  Glucose, capillary     Status: None   Collection Time: 11/02/21 10:28 PM  Result Value Ref Range   Glucose-Capillary 94 70 - 99 mg/dL  Glucose, capillary     Status: Abnormal   Collection Time: 11/02/21 11:31 PM  Result Value Ref Range   Glucose-Capillary 110 (H) 70 - 99 mg/dL  Glucose, capillary     Status: None   Collection Time: 11/03/21 12:30 AM  Result Value Ref Range   Glucose-Capillary 94 70 - 99 mg/dL  Glucose, capillary     Status: Abnormal   Collection Time: 11/03/21  2:36 AM  Result Value Ref Range   Glucose-Capillary 109 (H) 70 - 99 mg/dL  Glucose, capillary     Status: Abnormal   Collection Time: 11/03/21  4:31 AM  Result Value Ref Range   Glucose-Capillary 116 (H) 70 - 99 mg/dL   Glucose, capillary     Status: Abnormal   Collection Time: 11/03/21  6:40 AM  Result Value Ref Range   Glucose-Capillary 111 (H) 70 - 99 mg/dL  Glucose, capillary     Status: Abnormal   Collection Time: 11/03/21  8:36 AM  Result Value Ref Range   Glucose-Capillary 102 (H) 70 - 99 mg/dL  Glucose, capillary     Status: Abnormal   Collection Time: 11/03/21 10:28 AM  Result Value Ref Range   Glucose-Capillary 124 (H) 70 - 99 mg/dL  Glucose, capillary     Status: Abnormal   Collection Time: 11/03/21 11:33 AM  Result Value Ref Range   Glucose-Capillary 130 (H) 70 - 99 mg/dL  Glucose, capillary     Status: Abnormal   Collection Time: 11/03/21 12:30 PM  Result Value Ref Range   Glucose-Capillary 123 (H) 70 - 99 mg/dL  Glucose, capillary     Status: Abnormal   Collection Time: 11/03/21  1:32 PM  Result Value Ref Range   Glucose-Capillary 117 (H) 70 - 99 mg/dL  Glucose, capillary     Status: Abnormal   Collection Time: 11/03/21  2:37 PM  Result Value Ref Range   Glucose-Capillary 111 (H) 70 - 99 mg/dL  Glucose, capillary     Status: Abnormal   Collection Time: 11/03/21  3:36 PM  Result Value Ref Range   Glucose-Capillary 101 (H) 70 - 99 mg/dL    Assessment:   25 y.o. G3P0020 [redacted]w[redacted]d IOL DM2, on magnesium for preeclampsia with severe features by BP criteria (normal labs and urine protein)  Plan:   1) Labor  - AROM clear fluid - IUPC placed - continue to titrate pitocin  2) Fetus - cat I tracing  3) GHTN vs CHTN vs preeclampsia with severe features by BP - labs normal - continue magnesium sulfate  - continue labetalol 300mg  po bid  , MD, V[facility] Austria [facility], [facility] 11/03/2021, 5:38 PM

## 2021-11-03 NOTE — Progress Notes (Signed)
Subjective:  Feeling contractions, not particularly more painful since last check but more regular  Objective:   Vitals: Blood pressure 140/75, pulse 95, temperature 97.9 F (36.6 C), temperature source Oral, resp. rate 18, height 5\' 5"  (1.651 m), weight (!) 165.1 kg, last menstrual period 11/17/2020, SpO2 93 %.  Temp:  [97.7 F (36.5 C)-98.2 F (36.8 C)] 97.9 F (36.6 C) (11/29 1930) Pulse Rate:  [88-106] 95 (11/29 1832) Resp:  [15-20] 18 (11/29 1930) BP: (122-169)/(51-105) 140/75 (11/29 1832) SpO2:  [77 %-99 %] 93 % (11/29 1900)  Intake/Output Summary (Last 24 hours) at 11/03/2021 2006 Last data filed at 11/03/2021 1930 Gross per 24 hour  Intake 5461.92 ml  Output 6235 ml  Net -773.08 ml    General: NAD Abdomen: gravid, non-tender Cervical Exam:  Dilation: 4 Effacement (%): 60 Cervical Position: Anterior Station: -3 Presentation: Vertex Exam by:: Dr. [email or web address]  FHT: 130, moderate, +accels, no decels Toco: q1-48min  Results for orders placed or performed during the hospital encounter of 11/02/21 (from the past 24 hour(s))  Glucose, capillary     Status: Abnormal   Collection Time: 11/02/21  8:17 PM  Result Value Ref Range   Glucose-Capillary 107 (H) 70 - 99 mg/dL  Glucose, capillary     Status: Abnormal   Collection Time: 11/02/21  9:30 PM  Result Value Ref Range   Glucose-Capillary 103 (H) 70 - 99 mg/dL  Glucose, capillary     Status: None   Collection Time: 11/02/21 10:28 PM  Result Value Ref Range   Glucose-Capillary 94 70 - 99 mg/dL  Glucose, capillary     Status: Abnormal   Collection Time: 11/02/21 11:31 PM  Result Value Ref Range   Glucose-Capillary 110 (H) 70 - 99 mg/dL  Glucose, capillary     Status: None   Collection Time: 11/03/21 12:30 AM  Result Value Ref Range   Glucose-Capillary 94 70 - 99 mg/dL  Glucose, capillary     Status: Abnormal   Collection Time: 11/03/21  2:36 AM  Result Value Ref Range   Glucose-Capillary 109 (H) 70 - 99 mg/dL   Glucose, capillary     Status: Abnormal   Collection Time: 11/03/21  4:31 AM  Result Value Ref Range   Glucose-Capillary 116 (H) 70 - 99 mg/dL  Glucose, capillary     Status: Abnormal   Collection Time: 11/03/21  6:40 AM  Result Value Ref Range   Glucose-Capillary 111 (H) 70 - 99 mg/dL  Glucose, capillary     Status: Abnormal   Collection Time: 11/03/21  8:36 AM  Result Value Ref Range   Glucose-Capillary 102 (H) 70 - 99 mg/dL  Glucose, capillary     Status: Abnormal   Collection Time: 11/03/21 10:28 AM  Result Value Ref Range   Glucose-Capillary 124 (H) 70 - 99 mg/dL  Glucose, capillary     Status: Abnormal   Collection Time: 11/03/21 11:33 AM  Result Value Ref Range   Glucose-Capillary 130 (H) 70 - 99 mg/dL  Glucose, capillary     Status: Abnormal   Collection Time: 11/03/21 12:30 PM  Result Value Ref Range   Glucose-Capillary 123 (H) 70 - 99 mg/dL  Glucose, capillary     Status: Abnormal   Collection Time: 11/03/21  1:32 PM  Result Value Ref Range   Glucose-Capillary 117 (H) 70 - 99 mg/dL  Glucose, capillary     Status: Abnormal   Collection Time: 11/03/21  2:37 PM  Result Value Ref Range  Glucose-Capillary 111 (H) 70 - 99 mg/dL  Glucose, capillary     Status: Abnormal   Collection Time: 11/03/21  3:36 PM  Result Value Ref Range   Glucose-Capillary 101 (H) 70 - 99 mg/dL  Glucose, capillary     Status: None   Collection Time: 11/03/21  5:49 PM  Result Value Ref Range   Glucose-Capillary 89 70 - 99 mg/dL  Glucose, capillary     Status: None   Collection Time: 11/03/21  6:46 PM  Result Value Ref Range   Glucose-Capillary 97 70 - 99 mg/dL  CBC     Status: Abnormal   Collection Time: 11/03/21  7:05 PM  Result Value Ref Range   WBC 9.2 4.0 - 10.5 K/uL   RBC 4.38 3.87 - 5.11 MIL/uL   Hemoglobin 11.2 (L) 12.0 - 15.0 g/dL   HCT 46.9 (L) 62.9 - 52.8 %   MCV 81.3 80.0 - 100.0 fL   MCH 25.6 (L) 26.0 - 34.0 pg   MCHC 31.5 30.0 - 36.0 g/dL   RDW 41.3 (H) 24.4 - 01.0 %    Platelets 253 150 - 400 K/uL   nRBC 0.0 0.0 - 0.2 %    Assessment:   25 y.o. G3P0020 [redacted]w[redacted]d IOL DM2, and preeclampsia with severe features (by BP normal labs) vs GHTN/CHTN  Plan:   1) Labor  - continue pitocin - FSE placed, IUPC placed last check  2) Fetus - cat I tracing  3) GDM - endotool  4) Preeclampsia with severe features by BP (vs GHTN/CHTN) - labs and P/C ratio normal - continue magnesium sulfate  Vena Austria, MD, [name], [facility] 11/03/2021, 8:04 PM

## 2021-11-04 ENCOUNTER — Inpatient Hospital Stay: Payer: Medicaid Other | Admitting: Anesthesiology

## 2021-11-04 ENCOUNTER — Encounter: Payer: Self-pay | Admitting: Obstetrics and Gynecology

## 2021-11-04 ENCOUNTER — Encounter
Admission: EM | Disposition: A | Discharge status: Home / Self Care | Payer: Self-pay | Source: Home / Self Care | Attending: Obstetrics and Gynecology

## 2021-11-04 DIAGNOSIS — O24113 Pre-existing diabetes mellitus, type 2, in pregnancy, third trimester: Secondary | ICD-10-CM | POA: Diagnosis not present

## 2021-11-04 DIAGNOSIS — O134 Gestational [pregnancy-induced] hypertension without significant proteinuria, complicating childbirth: Secondary | ICD-10-CM | POA: Diagnosis not present

## 2021-11-04 DIAGNOSIS — O1413 Severe pre-eclampsia, third trimester: Secondary | ICD-10-CM

## 2021-11-04 DIAGNOSIS — O0933 Supervision of pregnancy with insufficient antenatal care, third trimester: Secondary | ICD-10-CM

## 2021-11-04 DIAGNOSIS — O1414 Severe pre-eclampsia complicating childbirth: Secondary | ICD-10-CM

## 2021-11-04 DIAGNOSIS — Z3A37 37 weeks gestation of pregnancy: Secondary | ICD-10-CM

## 2021-11-04 DIAGNOSIS — O99214 Obesity complicating childbirth: Secondary | ICD-10-CM

## 2021-11-04 DIAGNOSIS — Z794 Long term (current) use of insulin: Secondary | ICD-10-CM

## 2021-11-04 LAB — MAGNESIUM: Magnesium: 6.1 mg/dL (ref 1.7–2.4)

## 2021-11-04 LAB — CBC
HCT: 35.4 % — ABNORMAL LOW (ref 36.0–46.0)
HCT: 35.4 % — ABNORMAL LOW (ref 36.0–46.0)
HCT: 32 % — ABNORMAL LOW (ref 36.0–46.0)
Hemoglobin: 11 g/dL — ABNORMAL LOW (ref 12.0–15.0)
Hemoglobin: 10.8 g/dL — ABNORMAL LOW (ref 12.0–15.0)
Hemoglobin: 9.7 g/dL — ABNORMAL LOW (ref 12.0–15.0)
MCH: 25.3 pg — ABNORMAL LOW (ref 26.0–34.0)
MCH: 25.3 pg — ABNORMAL LOW (ref 26.0–34.0)
MCH: 24.9 pg — ABNORMAL LOW (ref 26.0–34.0)
MCHC: 31.1 g/dL (ref 30.0–36.0)
MCHC: 30.5 g/dL (ref 30.0–36.0)
MCHC: 30.3 g/dL (ref 30.0–36.0)
MCV: 81.4 fL (ref 80.0–100.0)
MCV: 82.9 fL (ref 80.0–100.0)
MCV: 82.1 fL (ref 80.0–100.0)
Platelets: 269 10*3/uL (ref 150–400)
Platelets: 284 10*3/uL (ref 150–400)
Platelets: 266 10*3/uL (ref 150–400)
RBC: 4.35 MIL/uL (ref 3.87–5.11)
RBC: 4.27 MIL/uL (ref 3.87–5.11)
RBC: 3.9 MIL/uL (ref 3.87–5.11)
RDW: 15.9 % — ABNORMAL HIGH (ref 11.5–15.5)
RDW: 16.1 % — ABNORMAL HIGH (ref 11.5–15.5)
RDW: 15.9 % — ABNORMAL HIGH (ref 11.5–15.5)
WBC: 10.7 10*3/uL — ABNORMAL HIGH (ref 4.0–10.5)
WBC: 10.4 10*3/uL (ref 4.0–10.5)
WBC: 14 10*3/uL — ABNORMAL HIGH (ref 4.0–10.5)
nRBC: 0 % (ref 0.0–0.2)
nRBC: 0 % (ref 0.0–0.2)
nRBC: 0 % (ref 0.0–0.2)

## 2021-11-04 LAB — CREATININE, SERUM
Creatinine, Ser: 0.69 mg/dL (ref 0.44–1.00)
GFR, Estimated: >60 mL/min (ref >60)

## 2021-11-04 LAB — GLUCOSE, CAPILLARY
Glucose-Capillary: 100 mg/dL — ABNORMAL HIGH (ref 70–99)
Glucose-Capillary: 103 mg/dL — ABNORMAL HIGH (ref 70–99)
Glucose-Capillary: 103 mg/dL — ABNORMAL HIGH (ref 70–99)
Glucose-Capillary: 103 mg/dL — ABNORMAL HIGH (ref 70–99)
Glucose-Capillary: 106 mg/dL — ABNORMAL HIGH (ref 70–99)
Glucose-Capillary: 107 mg/dL — ABNORMAL HIGH (ref 70–99)
Glucose-Capillary: 110 mg/dL — ABNORMAL HIGH (ref 70–99)
Glucose-Capillary: 171 mg/dL — ABNORMAL HIGH (ref 70–99)

## 2021-11-04 LAB — PLATELET COUNT: Platelets: 287 10*3/uL (ref 150–400)

## 2021-11-04 SURGERY — Surgical Case
Anesthesia: Epidural

## 2021-11-04 MED ORDER — PHENYLEPHRINE 40 MCG/ML (10ML) SYRINGE FOR IV PUSH (FOR BLOOD PRESSURE SUPPORT): 80.0000 ug | PREFILLED_SYRINGE | INTRAVENOUS | Status: DC | PRN

## 2021-11-04 MED ORDER — DEXAMETHASONE SODIUM PHOSPHATE 10 MG/ML IJ SOLN
INTRAMUSCULAR | Status: DC | PRN
  Administered 2021-11-04: 10 mg via INTRAVENOUS

## 2021-11-04 MED ORDER — LIDOCAINE-EPINEPHRINE (PF) 1.5 %-1:200000 IJ SOLN
INTRAMUSCULAR | Status: DC | PRN
  Administered 2021-11-04: 3 mL via EPIDURAL

## 2021-11-04 MED ORDER — OXYTOCIN-SODIUM CHLORIDE 30-0.9 UT/500ML-% IV SOLN
INTRAVENOUS | Status: AC
  Administered 2021-11-04: 2.5 [IU]/h via INTRAVENOUS
  Filled 2021-11-04: qty 500

## 2021-11-04 MED ORDER — COCONUT OIL OIL
1.0000 "application " | TOPICAL_OIL | Status: DC | PRN
  Filled 2021-11-04: qty 120

## 2021-11-04 MED ORDER — LABETALOL HCL 100 MG PO TABS
ORAL_TABLET | ORAL | Status: AC
  Filled 2021-11-04: qty 1

## 2021-11-04 MED ORDER — ENOXAPARIN SODIUM 80 MG/0.8ML IJ SOSY
80.0000 mg | PREFILLED_SYRINGE | INTRAMUSCULAR | Status: DC
  Administered 2021-11-05: 80 mg via SUBCUTANEOUS
  Filled 2021-11-04 (×3): qty 0.8

## 2021-11-04 MED ORDER — NALBUPHINE HCL 10 MG/ML IJ SOLN: 5.0000 mg | Freq: Once | INTRAMUSCULAR | Status: DC | PRN

## 2021-11-04 MED ORDER — LIDOCAINE HCL (PF) 1 % IJ SOLN
INTRAMUSCULAR | Status: DC | PRN
  Administered 2021-11-04: 5 mL via SUBCUTANEOUS

## 2021-11-04 MED ORDER — SOD CITRATE-CITRIC ACID 500-334 MG/5ML PO SOLN
ORAL | Status: AC
  Administered 2021-11-04: 30 mL
  Filled 2021-11-04: qty 15

## 2021-11-04 MED ORDER — OXYTOCIN-SODIUM CHLORIDE 30-0.9 UT/500ML-% IV SOLN
2.5000 [IU]/h | INTRAVENOUS | Status: DC
  Filled 2021-11-04: qty 500

## 2021-11-04 MED ORDER — SCOPOLAMINE 1 MG/3DAYS TD PT72: 1.0000 | MEDICATED_PATCH | Freq: Once | TRANSDERMAL | Status: DC

## 2021-11-04 MED ORDER — NALOXONE HCL 0.4 MG/ML IJ SOLN: 0.4000 mg | INTRAMUSCULAR | Status: DC | PRN

## 2021-11-04 MED ORDER — OXYCODONE HCL 5 MG PO TABS
10.0000 mg | ORAL_TABLET | ORAL | Status: AC | PRN
  Filled 2021-11-04: qty 2

## 2021-11-04 MED ORDER — SENNOSIDES-DOCUSATE SODIUM 8.6-50 MG PO TABS
2.0000 | ORAL_TABLET | ORAL | Status: DC
  Administered 2021-11-05: 2 via ORAL
  Filled 2021-11-04: qty 2

## 2021-11-04 MED ORDER — KETOROLAC TROMETHAMINE 30 MG/ML IJ SOLN: 30.0000 mg | Freq: Four times a day (QID) | INTRAMUSCULAR | Status: AC

## 2021-11-04 MED ORDER — OXYCODONE-ACETAMINOPHEN 5-325 MG PO TABS: 2.0000 | ORAL_TABLET | ORAL | Status: DC | PRN

## 2021-11-04 MED ORDER — NALOXONE HCL 4 MG/10ML IJ SOLN
1.0000 ug/kg/h | INTRAVENOUS | Status: DC | PRN
  Filled 2021-11-04: qty 5

## 2021-11-04 MED ORDER — MORPHINE SULFATE (PF) 0.5 MG/ML IJ SOLN
INTRAMUSCULAR | Status: DC | PRN
  Administered 2021-11-04: .4 mg via EPIDURAL

## 2021-11-04 MED ORDER — DIBUCAINE (PERIANAL) 1 % EX OINT: 1.0000 "application " | TOPICAL_OINTMENT | CUTANEOUS | Status: DC | PRN

## 2021-11-04 MED ORDER — ACETAMINOPHEN 500 MG PO TABS
1000.0000 mg | ORAL_TABLET | Freq: Four times a day (QID) | ORAL | Status: AC
  Administered 2021-11-04 – 2021-11-05 (×4): 1000 mg via ORAL
  Filled 2021-11-04 (×4): qty 2

## 2021-11-04 MED ORDER — CEFAZOLIN (ANCEF) 1 G IV SOLR: 3.0000 g | INTRAVENOUS | Status: DC

## 2021-11-04 MED ORDER — DIPHENHYDRAMINE HCL 25 MG PO CAPS: 25.0000 mg | ORAL_CAPSULE | Freq: Four times a day (QID) | ORAL | Status: DC | PRN

## 2021-11-04 MED ORDER — NALBUPHINE HCL 10 MG/ML IJ SOLN: 5.0000 mg | INTRAMUSCULAR | Status: DC | PRN

## 2021-11-04 MED ORDER — MENTHOL 3 MG MT LOZG
1.0000 | LOZENGE | OROMUCOSAL | Status: DC | PRN
  Filled 2021-11-04: qty 9

## 2021-11-04 MED ORDER — OXYCODONE-ACETAMINOPHEN 5-325 MG PO TABS: 1.0000 | ORAL_TABLET | ORAL | Status: DC | PRN

## 2021-11-04 MED ORDER — ONDANSETRON HCL 4 MG/2ML IJ SOLN
INTRAMUSCULAR | Status: DC | PRN
  Administered 2021-11-04: 4 mg via INTRAVENOUS

## 2021-11-04 MED ORDER — DIPHENHYDRAMINE HCL 50 MG/ML IJ SOLN: 12.5000 mg | INTRAMUSCULAR | Status: DC | PRN

## 2021-11-04 MED ORDER — SOD CITRATE-CITRIC ACID 500-334 MG/5ML PO SOLN: 30.0000 mL | ORAL | Status: DC

## 2021-11-04 MED ORDER — LACTATED RINGERS IV SOLN
500.0000 mL | Freq: Once | INTRAVENOUS | Status: AC
  Administered 2021-11-04: 500 mL via INTRAVENOUS

## 2021-11-04 MED ORDER — PRENATAL MULTIVITAMIN CH
1.0000 | ORAL_TABLET | Freq: Every day | ORAL | Status: DC
  Administered 2021-11-05: 1 via ORAL
  Filled 2021-11-04: qty 1

## 2021-11-04 MED ORDER — FENTANYL-BUPIVACAINE-NACL 0.5-0.125-0.9 MG/250ML-% EP SOLN: 12.0000 mL/h | EPIDURAL | Status: DC | PRN

## 2021-11-04 MED ORDER — MEPERIDINE HCL 25 MG/ML IJ SOLN: 6.2500 mg | INTRAMUSCULAR | Status: DC | PRN

## 2021-11-04 MED ORDER — BUPIVACAINE HCL (PF) 0.5 % IJ SOLN: 5.0000 mL | Freq: Once | INTRAMUSCULAR | Status: DC

## 2021-11-04 MED ORDER — EPHEDRINE 5 MG/ML INJ
10.0000 mg | INTRAVENOUS | Status: DC | PRN
  Filled 2021-11-04: qty 4

## 2021-11-04 MED ORDER — LACTATED RINGERS IV SOLN: INTRAVENOUS | Status: DC

## 2021-11-04 MED ORDER — FENTANYL-BUPIVACAINE-NACL 0.5-0.125-0.9 MG/250ML-% EP SOLN
EPIDURAL | Status: DC | PRN
  Administered 2021-11-04: 12 mL/h via EPIDURAL

## 2021-11-04 MED ORDER — SODIUM CHLORIDE 0.9 % IV SOLN
INTRAVENOUS | Status: DC | PRN
  Administered 2021-11-04 (×2): 5 mL via EPIDURAL

## 2021-11-04 MED ORDER — INSULIN ASPART 100 UNIT/ML IJ SOLN
0.0000 [IU] | Freq: Three times a day (TID) | INTRAMUSCULAR | Status: DC
  Administered 2021-11-04: 3 [IU] via SUBCUTANEOUS
  Administered 2021-11-05: 2 [IU] via SUBCUTANEOUS
  Filled 2021-11-04 (×2): qty 1

## 2021-11-04 MED ORDER — SIMETHICONE 80 MG PO CHEW
80.0000 mg | CHEWABLE_TABLET | Freq: Three times a day (TID) | ORAL | Status: DC
  Administered 2021-11-04 – 2021-11-06 (×4): 80 mg via ORAL
  Filled 2021-11-04 (×4): qty 1

## 2021-11-04 MED ORDER — TRANEXAMIC ACID-NACL 1000-0.7 MG/100ML-% IV SOLN
INTRAVENOUS | Status: AC
  Filled 2021-11-04: qty 100

## 2021-11-04 MED ORDER — OXYCODONE HCL 5 MG PO TABS
5.0000 mg | ORAL_TABLET | ORAL | Status: AC | PRN
  Administered 2021-11-05: 5 mg via ORAL

## 2021-11-04 MED ORDER — BUPIVACAINE 0.25 % ON-Q PUMP DUAL CATH 400 ML
400.0000 mL | INJECTION | Status: DC
  Filled 2021-11-04: qty 400

## 2021-11-04 MED ORDER — CEFAZOLIN IN SODIUM CHLORIDE 3-0.9 GM/100ML-% IV SOLN
3.0000 g | Freq: Once | INTRAVENOUS | Status: AC
  Administered 2021-11-04: 3 g via INTRAVENOUS
  Filled 2021-11-04: qty 100

## 2021-11-04 MED ORDER — PHENYLEPHRINE HCL-NACL 20-0.9 MG/250ML-% IV SOLN
INTRAVENOUS | Status: DC | PRN
  Administered 2021-11-04: 40 ug/min via INTRAVENOUS

## 2021-11-04 MED ORDER — WITCH HAZEL-GLYCERIN EX PADS: 1.0000 "application " | MEDICATED_PAD | CUTANEOUS | Status: DC | PRN

## 2021-11-04 MED ORDER — TRANEXAMIC ACID-NACL 1000-0.7 MG/100ML-% IV SOLN
INTRAVENOUS | Status: DC | PRN
  Administered 2021-11-04: 1000 mg via INTRAVENOUS

## 2021-11-04 MED ORDER — MORPHINE SULFATE (PF) 0.5 MG/ML IJ SOLN
INTRAMUSCULAR | Status: AC
  Filled 2021-11-04: qty 10

## 2021-11-04 MED ORDER — SODIUM CHLORIDE 0.9% FLUSH: 3.0000 mL | INTRAVENOUS | Status: DC | PRN

## 2021-11-04 MED ORDER — EPHEDRINE 5 MG/ML INJ
10.0000 mg | INTRAVENOUS | Status: AC | PRN
  Administered 2021-11-04 (×2): 10 mg via INTRAVENOUS

## 2021-11-04 MED ORDER — BUPIVACAINE HCL (PF) 0.5 % IJ SOLN
INTRAMUSCULAR | Status: AC
  Filled 2021-11-04: qty 30

## 2021-11-04 MED ORDER — IBUPROFEN 600 MG PO TABS
600.0000 mg | ORAL_TABLET | Freq: Four times a day (QID) | ORAL | Status: DC
  Administered 2021-11-05 – 2021-11-06 (×3): 600 mg via ORAL
  Filled 2021-11-04 (×3): qty 1

## 2021-11-04 MED ORDER — ONDANSETRON HCL 4 MG/2ML IJ SOLN: 4.0000 mg | Freq: Three times a day (TID) | INTRAMUSCULAR | Status: DC | PRN

## 2021-11-04 MED ORDER — FERROUS SULFATE 325 (65 FE) MG PO TABS
325.0000 mg | ORAL_TABLET | Freq: Two times a day (BID) | ORAL | Status: DC
  Administered 2021-11-05 – 2021-11-06 (×3): 325 mg via ORAL
  Filled 2021-11-04 (×3): qty 1

## 2021-11-04 MED ORDER — SODIUM CHLORIDE 0.9 % IV SOLN
500.0000 mg | INTRAVENOUS | Status: AC
  Administered 2021-11-04: 500 mg via INTRAVENOUS
  Filled 2021-11-04: qty 500

## 2021-11-04 MED ORDER — KETOROLAC TROMETHAMINE 30 MG/ML IJ SOLN
30.0000 mg | Freq: Four times a day (QID) | INTRAMUSCULAR | Status: AC
  Administered 2021-11-04 – 2021-11-05 (×3): 30 mg via INTRAVENOUS
  Filled 2021-11-04 (×3): qty 1

## 2021-11-04 MED ORDER — FENTANYL-BUPIVACAINE-NACL 0.5-0.125-0.9 MG/250ML-% EP SOLN
EPIDURAL | Status: AC
  Filled 2021-11-04: qty 250

## 2021-11-04 MED ORDER — LIDOCAINE HCL (PF) 2 % IJ SOLN
INTRAMUSCULAR | Status: DC | PRN
  Administered 2021-11-04 (×3): 100 mg via EPIDURAL

## 2021-11-04 SURGICAL SUPPLY — 39 items
CATH KIT ON-Q SILVERSOAK 5IN (CATHETERS) ×4 IMPLANT
DERMABOND ADVANCED (GAUZE/BANDAGES/DRESSINGS) ×1
DERMABOND ADVANCED .7 DNX12 (GAUZE/BANDAGES/DRESSINGS) ×1 IMPLANT
DRESSING PEEL AND PLAC PRVNA20 (GAUZE/BANDAGES/DRESSINGS) ×1 IMPLANT
DRESSING PEEL AND PLC PRVNA 13 (GAUZE/BANDAGES/DRESSINGS) ×2 IMPLANT
DRSG OPSITE POSTOP 4X10 (GAUZE/BANDAGES/DRESSINGS) ×2 IMPLANT
DRSG PEEL AND PLACE PREVENA 13 (GAUZE/BANDAGES/DRESSINGS) ×4
DRSG PEEL AND PLACE PREVENA 20 (GAUZE/BANDAGES/DRESSINGS) ×2
DRSG TELFA 3X8 NADH (GAUZE/BANDAGES/DRESSINGS) ×2 IMPLANT
ELECT CAUTERY BLADE 6.4 (BLADE) ×2 IMPLANT
ELECT REM PT RETURN 9FT ADLT (ELECTROSURGICAL) ×2
ELECTRODE REM PT RTRN 9FT ADLT (ELECTROSURGICAL) ×1 IMPLANT
GAUZE SPONGE 4X4 12PLY STRL (GAUZE/BANDAGES/DRESSINGS) ×2 IMPLANT
GLOVE SURG ENC MOIS LTX SZ7 (GLOVE) ×2 IMPLANT
GLOVE SURG UNDER LTX SZ7.5 (GLOVE) ×2 IMPLANT
GOWN STRL REUS W/ TWL LRG LVL3 (GOWN DISPOSABLE) ×3 IMPLANT
GOWN STRL REUS W/TWL LRG LVL3 (GOWN DISPOSABLE) ×3
MANIFOLD NEPTUNE II (INSTRUMENTS) ×2 IMPLANT
MAT PREVALON FULL STRYKER (MISCELLANEOUS) ×2 IMPLANT
NS IRRIG 1000ML POUR BTL (IV SOLUTION) ×2 IMPLANT
PACK C SECTION AR (MISCELLANEOUS) ×2 IMPLANT
PAD OB MATERNITY 4.3X12.25 (PERSONAL CARE ITEMS) ×4 IMPLANT
PAD PREP 24X41 OB/GYN DISP (PERSONAL CARE ITEMS) ×2 IMPLANT
PENCIL SMOKE EVACUATOR (MISCELLANEOUS) ×2 IMPLANT
RETRACTOR TRAXI PANNICULUS (MISCELLANEOUS) ×2 IMPLANT
RTRCTR C-SECT PINK 34CM XLRG (MISCELLANEOUS) ×2 IMPLANT
SCRUB EXIDINE 4% CHG 4OZ (MISCELLANEOUS) ×2 IMPLANT
STRIP CLOSURE SKIN 1/2X4 (GAUZE/BANDAGES/DRESSINGS) ×2 IMPLANT
SUCT VACUUM KIWI BELL (SUCTIONS) ×2 IMPLANT
SUT MNCRL 4-0 (SUTURE) ×1
SUT MNCRL 4-0 27XMFL (SUTURE) ×1
SUT PDS AB 1 TP1 96 (SUTURE) ×2 IMPLANT
SUT PLAIN GUT 0 (SUTURE) IMPLANT
SUT VIC AB 0 CTX 36 (SUTURE) ×2
SUT VIC AB 0 CTX36XBRD ANBCTRL (SUTURE) ×2 IMPLANT
SUTURE MNCRL 4-0 27XMF (SUTURE) ×1 IMPLANT
SWABSTK COMLB BENZOIN TINCTURE (MISCELLANEOUS) ×2 IMPLANT
TRAXI PANNICULUS RETRACTOR (MISCELLANEOUS) ×2
WATER STERILE IRR 500ML POUR (IV SOLUTION) ×2 IMPLANT

## 2021-11-04 NOTE — Progress Notes (Addendum)
[place] CNM at bedside to evaluate patient SPO2 down into the 70's-80's during sleep. Order given for 2L via [place] and RN started at 1036. Spo2 came up to 96%.

## 2021-11-04 NOTE — Discharge Summary (Signed)
Postpartum Discharge Summary    Patient Name: Robin Houston DOB: 10-10-1996 MRN: [id number]  Date of admission: 11/02/2021 Delivery date:11/04/2021  Delivering provider: Prentice Docker D  Date of discharge: 11/06/2021  Admitting diagnosis: Supervision of high-risk pregnancy, third trimester [O09.93] Intrauterine pregnancy: [redacted]w[redacted]d    Secondary diagnosis:  Principal Problem:   Supervision of high-risk pregnancy, third trimester Active Problems:   Obesity affecting pregnancy in second trimester   Pregnancy complicated by pre-existing type 2 diabetes   Preeclampsia, severe, third trimester   [redacted] weeks gestation of pregnancy   Encounter for care or examination of lactating mother   Postpartum care following cesarean delivery  Additional problems: postpartum hemorrhage    Discharge diagnosis: Term Pregnancy Delivered, Preeclampsia (severe), Type 2 DM, and postpartum hemorrhage                                               Post partum procedures: none Augmentation: AROM, Pitocin, Cytotec, and IP Foley Complications: [id number] Hospital course: Induction of Labor With Cesarean Section   25y.o. yo [id number] admitted to the hospital 11/02/2021 for induction of labor. She was noted to have some severe range blood pressures and was diagnosed with preeclampsia with severe features. She had normal labs.  Patient had a labor course significant for initially placing cytotec. She received a foley balloon and this dilated her cervix to 4 cm.  She was started on pitocin and eventually AROM was performed and internals were placed.  She was on pitocin with ruptured membranes for 19 hours. Since she had no cervical change, she met criteria for failed induction. The patient went for cesarean section due to  failed induction . Delivery details are as follows: Membrane Rupture Time/Date: 5:33 PM ,11/03/2021   Delivery Method:C-Section, Vacuum Assisted  Details of operation can be found in  separate operative Note.  She was maintained on magnesium for 24 hours postpartum.  She had sliding scale coverage for her diabetes. She was continued on labetalol 300 mg.  She is normo-tensive by discharge and labetalol discontinued.  She is ambulating, tolerating a regular diet, passing flatus, and urinating well.  Patient is discharged home in stable condition on 11/06/21.  Discharge medications include Lovenox 80 mg daily, Metformin 1000 mg BID, Roxicodone PRN.    Newborn Data: Birth date:11/04/2021  Birth time:2:45 PM  Gender:Female  "Aiden" Living status:Living  Apgars:1 ,8  Weight:3660 g                                Magnesium Sulfate received: Yes: Seizure prophylaxis BMZ received: No Rhophylac:N/A MMR:N/A T-DaP:Given prenatally 09/08/2021 Flu: given prenatally 09/08/2021 Transfusion:No  Physical exam  Vitals:   11/05/21 1925 11/05/21 2333 11/06/21 0319 11/06/21 0841  BP: 135/71 112/62 (!) 112/59 137/76  Pulse: 98 94 99 95  Resp: _0 Temp: 98.4 F (36.9 C) 98.4 F (36.9 C) 98 F (36.7 C) 98.6 F (37 C)  TempSrc: Oral Oral Oral Oral  SpO2: 95% 96% 97% 93%  Weight:      Height:       General: alert, cooperative, and no distress Lochia: appropriate Uterine Fundus: firm Incision: Wound vac intact DVT Evaluation: No evidence of DVT seen on physical exam. Labs: Lab Results  Component Value Date  WBC 12.3 (H) 11/06/2021   HGB 8.2 (L) 11/06/2021   HCT 27.5 (L) 11/06/2021   MCV 83.8 11/06/2021   PLT 255 11/06/2021   CMP Latest Ref Rng & Units 11/06/2021  Glucose 70 - 99 mg/dL 140(H)  BUN 6 - 20 mg/dL 20  Creatinine 0.44 - 1.00 mg/dL 0.78  Sodium 135 - 145 mmol/L 134(L)  Potassium 3.5 - 5.1 mmol/L 4.2  Chloride 98 - 111 mmol/L 105  CO2 22 - 32 mmol/L 26  Calcium 8.9 - 10.3 mg/dL 7.9(L)  Total Protein 6.5 - 8.1 g/dL -  Total Bilirubin 0.3 - 1.2 mg/dL -  Alkaline Phos 38 - 126 U/L -  AST 15 - 41 U/L -  ALT 0 - 44 U/L -   Edinburgh Score: Edinburgh  Postnatal Depression Scale Screening Tool 11/05/2021  I have been able to laugh and see the funny side of things. 0  I have looked forward with enjoyment to things. 0  I have blamed myself unnecessarily when things went wrong. 0  I have been anxious or worried for no good reason. 0  I have felt scared or panicky for no good reason. 0  Things have been getting on top of me. 1  I have been so unhappy that I have had difficulty sleeping. 1  I have felt sad or miserable. 0  I have been so unhappy that I have been crying. 0  The thought of harming myself has occurred to me. 0  Edinburgh Postnatal Depression Scale Total 2      After visit meds:  Allergies as of 11/06/2021   No Known Allergies      Medication List     STOP taking these medications    Accu-Chek Guide w/Device Kit   Accu-Chek Softclix Lancets lancets   aspirin EC 81 MG tablet   glucose blood test strip   insulin aspart 100 UNIT/ML FlexPen Commonly known as: NOVOLOG   insulin glargine 100 UNIT/ML Solostar Pen Commonly known as: LANTUS   Insulin Pen Needle 31G X 8 MM Misc       TAKE these medications    enoxaparin 80 MG/0.8ML injection Commonly known as: LOVENOX Inject 0.8 mLs (80 mg total) into the skin daily.   ferrous sulfate 325 (65 FE) MG EC tablet Take 325 mg by mouth 3 (three) times daily with meals.   metFORMIN 1000 MG tablet Commonly known as: GLUCOPHAGE TAKE 1 TABLET (1,000 MG TOTAL) BY MOUTH 2 (TWO) TIMES DAILY WITH A MEAL.   multivitamin-prenatal 27-0.8 MG Tabs tablet Take 1 tablet by mouth daily at 12 noon.   oxyCODONE 5 MG immediate release tablet Commonly known as: Roxicodone Take 1 tablet (5 mg total) by mouth every 6 (six) hours as needed for up to 5 days for severe pain.               Discharge Care Instructions  (From admission, onward)           Start     Ordered   11/06/21 0000  Discharge wound care:       Comments: Keep incision dry, clean.   11/06/21 0940              Discharge home in stable condition Infant Feeding:  Breast and Formula Infant Disposition:home with mother Discharge instruction: per After Visit Summary and Postpartum booklet. Activity: Advance as tolerated. Pelvic rest for 6 weeks.  Diet: carb modified diet Anticipated Birth Control: POPs Postpartum Appointment:1 week Additional Postpartum  F/U: Incision check 1 week and BP check 1 week No future appointments.  Follow up Visit:  Follow-up Information     Gae Dry, MD. Schedule an appointment as soon as possible for a visit in 1 week(s).   Specialty: Obstetrics and Gynecology Why: For incision check and Prevena removal Contact information: [address] [place] [address] [phone]                SIGNED: Christean Leaf, CNM [facility] 11/06/2021, 9:47 AM

## 2021-11-04 NOTE — Anesthesia Procedure Notes (Signed)
Epidural Patient location during procedure: OB Start time: 11/04/2021 2:33 AM End time: 11/04/2021 2:45 AM  Staffing Anesthesiologist: Lenard Simmer, MD Performed: anesthesiologist   Preanesthetic Checklist Completed: patient identified, IV checked, site marked, risks and benefits discussed, surgical consent, monitors and equipment checked, pre-op evaluation and timeout performed  Epidural Patient position: sitting Prep: ChloraPrep Patient monitoring: heart rate, continuous pulse ox and blood pressure Approach: midline Location: L3-L4 Injection technique: LOR saline  Needle:  Needle type: Tuohy  Needle gauge: 17 G Needle length: 9 cm and 9 Needle insertion depth: 10.5 cm Catheter type: closed end flexible Catheter size: 19 Gauge Catheter at skin depth: 16 cm Test dose: negative and 1.5% lidocaine with Epi 1:200 K  Assessment Sensory level: T10 Events: blood not aspirated, injection not painful, no injection resistance, no paresthesia and negative IV test  Additional Notes 1st attempt Pt. Evaluated and documentation done after procedure finished. Patient identified. Risks/Benefits/Options discussed with patient including but not limited to bleeding, infection, nerve damage, paralysis, failed block, incomplete pain control, headache, blood pressure changes, nausea, vomiting, reactions to medication both or allergic, itching and postpartum back pain. Confirmed with bedside nurse the patient's most recent platelet count. Confirmed with patient that they are not currently taking any anticoagulation, have any bleeding history or any family history of bleeding disorders. Patient expressed understanding and wished to proceed. All questions were answered. Sterile technique was used throughout the entire procedure. Please see nursing notes for vital signs. Test dose was given through epidural catheter and negative prior to continuing to dose epidural or start infusion. Warning signs of  high block given to the patient including shortness of breath, tingling/numbness in hands, complete motor block, or any concerning symptoms with instructions to call for help. Patient was given instructions on fall risk and not to get out of bed. All questions and concerns addressed with instructions to call with any issues or inadequate analgesia.    Patient tolerated the insertion well without immediate complications.Reason for block:procedure for pain

## 2021-11-04 NOTE — Op Note (Signed)
Cesarean Section Operative Note    Patient Name: Robin Houston  MRN: [id number]  Date of Surgery: 11/04/2021   Pre-operative Diagnosis:  1) Failed induction of labor 2) intrauterine pregnancy at [redacted]w[redacted]d  3) Pregnancy complicated by preexisting type 2 diabetes 4) preeclampsia with severe features  Post-operative Diagnosis:  1) Failed induction of labor 2) intrauterine pregnancy at [redacted]w[redacted]d  3) Pregnancy complicated by preexisting type 2 diabetes 4) preeclampsia with severe features   Procedure: Primary low transverse cesarean section via Pfannenstiel incision with double layer uterine closure  Surgeon: Surgeon(s) and Role:    Conard Novak, MD - Primary  Assistants: Dr. [name]; No other capable assistant available, in surgery requiring high level assistant.  Anesthesia: epidural   Findings:  1) normal appearing gravid uterus, fallopian tubes, and ovaries 2) viable female infant with weight of 3,660 grams and APGARs 1 and 8   Quantified Blood Loss: 1,515 mL  Total IV Fluids: 900 ml   Urine Output:  100 mL clear urine at end of surgery  Specimens: none  Complications: no complications  Disposition: PACU - hemodynamically stable.   Maternal Condition: stable   Baby condition / location:  Couplet care / Skin to Skin  Procedure Details:  The patient was seen in the Holding Room. The risks, benefits, complications, treatment options, and expected outcomes were discussed with the patient. The patient concurred with the proposed plan, giving informed consent. identified as Robin Houston and the procedure verified as C-Section Delivery. A Time Out was held and the above information confirmed.   After induction of anesthesia, the patient was draped and prepped in the usual sterile manner. A Pfannenstiel incision was made and carried down through the subcutaneous tissue to the fascia. Fascial incision was made and extended transversely. The fascia was separated from the  underlying rectus tissue superiorly and inferiorly. The peritoneum was identified and entered. Peritoneal incision was extended longitudinally.  A large Alexis retractor was placed and prior to tightening it was verified that no bowel or other organs were caught under the interior rim of the retractor. The bladder flap was not bluntly or sharply freed from the lower uterine segment. A low transverse uterine incision was made and the hysterotomy was extended with cranial-caudal tension. Delivered using a bell vacuum from cephalic presentation was a 3,660 gram Living newborn infant(s) or Female with Apgar scores of 1 at one minute and 8 at five minutes. Cord ph was  not sent.  The umbilical cord was clamped and cut cord blood was not obtained for evaluation. The placenta was removed Intact and appeared normal. The uterine outline, tubes and ovaries appeared normal. The uterine incision was closed with running locked sutures of 0 Vicryl.  A second layer of the same suture was thrown in an imbricating fashion.  Hemostasis was assured.  The uterus was returned to the abdomen and the paracolic gutters were cleared of all clots and debris.  The Alexis retractor was removed.  The rectus muscles were inspected and found to be hemostatic.  The fascia was then reapproximated with running sutures of 1-0 PDS, looped. The subcutaneous tissue was reapproximated using 2-0 plain gut such that no greater than 2cm of dead space remained. The subcuticular closure was performed using 4-0 monocryl.   The 20 cm Prevena wound vac was placed in accordance with the manufacturer's recommendations.    The patient had a uterus with a boggy tone after starting pitocon. She was given IV TXA 1,000 mg.  The surgical assistant performed tissue retraction, assistance with suturing, and fundal pressure.    Instrument, sponge, and needle counts were correct prior the abdominal closure and were correct at the conclusion of the case.  The  patient received Ancef 3 gram and Azithromycin 500 mg IV prior to skin incision (within 30 minutes). The vagina was prepped with betadine prior to the start of the surgery. For VTE prophylaxis she was wearing SCDs throughout the case.  The assistant surgeon was an MD due to lack of availability of another Sales promotion account executive.    Signed: Conard Novak, MD 11/04/2021 3:42 PM

## 2021-11-04 NOTE — Lactation Note (Signed)
This note was copied from a baby's chart. Lactation Consultation Note  Patient Name: Robin Houston HQION'G Date: 11/04/2021 Reason for consult: L&D Initial assessment;Primapara;1st time breastfeeding;Early term 37-38.6wks;Other (Comment) (Mom Type II DM) Age:26 hours  Initial lactation visit in L&D. Mom is P1, c/s delivery after failed IOL. Mom has type 2 DM so baby's sugars will be monitored per protocol. OB RN assisted with positioning and attempting latching for the first feeding until LC was able to arrive. LC stepped in and assisted with continued hand expression; LC notes that baby began to suck on tongue and upper lip and did no open. Nipple shield was placed (size 72mm), baby did open and sustain latch for 25 minutes with continual stimulation efforts and LC remaining at bedside.  LC educated parents on the need for nipple shield to help baby learn to keep tongue down and to not suck in upper lip; this is a temporary tool as mom has everted nipples and soft pliable tissue. Parents were also educated on positioning and alignment, hand expression, and feeding plan being based on outcome of sugar levels. Parents verbalized understanding with all teaching and demonstration. LC moved baby to mom's chest skin to skin post feeding where he was sleeping. Transition RN updated for time feeding ended.  Maternal Data Has patient been taught Hand Expression?: Yes Does the patient have breastfeeding experience prior to this delivery?: No  Feeding Mother's Current Feeding Choice: Breast Milk and Formula  LATCH Score Latch: Grasps breast easily, tongue down, lips flanged, rhythmical sucking. (with nipple shield)  Audible Swallowing: A few with stimulation  Type of Nipple: Everted at rest and after stimulation  Comfort (Breast/Nipple): Soft / non-tender  Hold (Positioning): Full assist, staff holds infant at breast  LATCH Score: 7   Lactation Tools Discussed/Used Tools: Nipple  Shields Nipple shield size: 20  Interventions Interventions: Breast feeding basics reviewed;Assisted with latch;Hand express;Breast massage;Breast compression;Support pillows;Position options;Education  Discharge    Consult Status Consult Status: Follow-up from L&D    Robin Houston 11/04/2021, 5:08 PM

## 2021-11-04 NOTE — Progress Notes (Addendum)
Subjective:  Comfortable epidural now in place  Objective:   Vitals: Blood pressure 112/74, pulse 87, temperature 98.1 F (36.7 C), temperature source Oral, resp. rate 18, height 5\' 5"  (1.651 m), weight (!) 165.1 kg, last menstrual period 11/17/2020, SpO2 96 %. General: NAD Abdomen: gravid, non-tender Cervical Exam:  Dilation: 4 Effacement (%): 80 Cervical Position: Anterior Station: -3 Presentation: Vertex Exam by:: Dr. [email or web address]  FHT: 130, moderate, +accels, no decels Toco: q2-23min  Results for orders placed or performed during the hospital encounter of 11/02/21 (from the past 24 hour(s))  Glucose, capillary     Status: Abnormal   Collection Time: 11/03/21  6:40 AM  Result Value Ref Range   Glucose-Capillary 111 (H) 70 - 99 mg/dL  Glucose, capillary     Status: Abnormal   Collection Time: 11/03/21  8:36 AM  Result Value Ref Range   Glucose-Capillary 102 (H) 70 - 99 mg/dL  Glucose, capillary     Status: Abnormal   Collection Time: 11/03/21 10:28 AM  Result Value Ref Range   Glucose-Capillary 124 (H) 70 - 99 mg/dL  Glucose, capillary     Status: Abnormal   Collection Time: 11/03/21 11:33 AM  Result Value Ref Range   Glucose-Capillary 130 (H) 70 - 99 mg/dL  Glucose, capillary     Status: Abnormal   Collection Time: 11/03/21 12:30 PM  Result Value Ref Range   Glucose-Capillary 123 (H) 70 - 99 mg/dL  Glucose, capillary     Status: Abnormal   Collection Time: 11/03/21  1:32 PM  Result Value Ref Range   Glucose-Capillary 117 (H) 70 - 99 mg/dL  Glucose, capillary     Status: Abnormal   Collection Time: 11/03/21  2:37 PM  Result Value Ref Range   Glucose-Capillary 111 (H) 70 - 99 mg/dL  Glucose, capillary     Status: Abnormal   Collection Time: 11/03/21  3:36 PM  Result Value Ref Range   Glucose-Capillary 101 (H) 70 - 99 mg/dL  Glucose, capillary     Status: None   Collection Time: 11/03/21  5:49 PM  Result Value Ref Range   Glucose-Capillary 89 70 - 99 mg/dL   Glucose, capillary     Status: None   Collection Time: 11/03/21  6:46 PM  Result Value Ref Range   Glucose-Capillary 97 70 - 99 mg/dL  CBC     Status: Abnormal   Collection Time: 11/03/21  7:05 PM  Result Value Ref Range   WBC 9.2 4.0 - 10.5 K/uL   RBC 4.38 3.87 - 5.11 MIL/uL   Hemoglobin 11.2 (L) 12.0 - 15.0 g/dL   HCT 11/05/21 (L) 69.6 - 78.9 %   MCV 81.3 80.0 - 100.0 fL   MCH 25.6 (L) 26.0 - 34.0 pg   MCHC 31.5 30.0 - 36.0 g/dL   RDW 38.1 (H) 01.7 - 51.0 %   Platelets 253 150 - 400 K/uL   nRBC 0.0 0.0 - 0.2 %  Glucose, capillary     Status: None   Collection Time: 11/03/21  7:49 PM  Result Value Ref Range   Glucose-Capillary 97 70 - 99 mg/dL  Glucose, capillary     Status: Abnormal   Collection Time: 11/03/21  8:51 PM  Result Value Ref Range   Glucose-Capillary 107 (H) 70 - 99 mg/dL  Glucose, capillary     Status: Abnormal   Collection Time: 11/03/21 10:34 PM  Result Value Ref Range   Glucose-Capillary 101 (H) 70 - 99 mg/dL  Glucose,  capillary     Status: Abnormal   Collection Time: 11/04/21 12:29 AM  Result Value Ref Range   Glucose-Capillary 106 (H) 70 - 99 mg/dL  CBC     Status: Abnormal   Collection Time: 11/04/21  1:28 AM  Result Value Ref Range   WBC 10.7 (H) 4.0 - 10.5 K/uL   RBC 4.35 3.87 - 5.11 MIL/uL   Hemoglobin 11.0 (L) 12.0 - 15.0 g/dL   HCT 20.2 (L) 33.4 - 35.6 %   MCV 81.4 80.0 - 100.0 fL   MCH 25.3 (L) 26.0 - 34.0 pg   MCHC 31.1 30.0 - 36.0 g/dL   RDW 86.1 (H) 68.3 - 72.9 %   Platelets 269 150 - 400 K/uL   nRBC 0.0 0.0 - 0.2 %  Glucose, capillary     Status: Abnormal   Collection Time: 11/04/21  3:18 AM  Result Value Ref Range   Glucose-Capillary 103 (H) 70 - 99 mg/dL    Assessment:   25 y.o. G3P0020 [redacted]w[redacted]d IOLD DM2, preeclampsia with severe features (BP criteria)  Plan:   1) Labor  - continue to titrate pitocin currently at 16 miliunits - FSE and IUPC in placed contraction pattern improving, starting to efface - discussed pros of going to  30 on pitocin vs pitocin wash out.  On one hand given her BMI and volume of distribution she may benefit from going to pitocin fo 30 miliunits, on the other hand the anti-diuretic effects of higher dose pitocin while on magnesium sulfate.  Should we go above 30 would need to  monitor urine output closely - epidural in place now  2) Fetus - cat I tracing, did have hypotension following epidural and required ephedrine  3) GDM - Continue endo tool  4) Preeclampsia with severe features (asymptomatic BP criteria only, normal labs) - hold labetalol given normotensive since epidural and hypotension immediately after epidural placement  Vena Austria, MD, [name], [facility] 11/04/2021, 5:55 AM

## 2021-11-04 NOTE — Anesthesia Procedure Notes (Signed)
Date/Time: 11/04/2021 2:12 PM Performed by: Ginger Carne, CRNA Pre-anesthesia Checklist: Patient identified, Emergency Drugs available, Suction available, Patient being monitored and Timeout performed Patient Re-evaluated:Patient Re-evaluated prior to induction Oxygen Delivery Method: Nasal cannula Preoxygenation: Pre-oxygenation with 100% oxygen

## 2021-11-04 NOTE — Anesthesia Preprocedure Evaluation (Signed)
Anesthesia Evaluation  Patient identified by MRN, date of birth, ID band Patient awake    Reviewed: Allergy & Precautions, H&P , NPO status , Patient's Chart, lab work & pertinent test results, reviewed documented beta blocker date and time   History of Anesthesia Complications Negative for: history of anesthetic complications  Airway Mallampati: II  TM Distance: >3 FB Neck ROM: full    Dental no notable dental hx. (+) Teeth Intact   Pulmonary neg pulmonary ROS,    Pulmonary exam normal breath sounds clear to auscultation       Cardiovascular Exercise Tolerance: Good negative cardio ROS Normal cardiovascular exam Rhythm:regular Rate:Normal     Neuro/Psych negative neurological ROS  negative psych ROS   GI/Hepatic Neg liver ROS, GERD  ,  Endo/Other  diabetesMorbid obesity  Renal/GU negative Renal ROS  negative genitourinary   Musculoskeletal   Abdominal   Peds  Hematology  (+) Blood dyscrasia, anemia ,   Anesthesia Other Findings Past Medical History: No date: Anemia No date: Diabetes mellitus without complication (HCC)     Comment:  type 2   Reproductive/Obstetrics (+) Pregnancy                             Anesthesia Physical Anesthesia Plan  ASA: 3  Anesthesia Plan: Epidural   Post-op Pain Management:    Induction:   PONV Risk Score and Plan:   Airway Management Planned:   Additional Equipment:   Intra-op Plan:   Post-operative Plan:   Informed Consent: I have reviewed the patients History and Physical, chart, labs and discussed the procedure including the risks, benefits and alternatives for the proposed anesthesia with the patient or authorized representative who has indicated his/her understanding and acceptance.     Dental Advisory Given  Plan Discussed with: Anesthesiologist, CRNA and Surgeon  Anesthesia Plan Comments:         Anesthesia Quick  Evaluation

## 2021-11-04 NOTE — Progress Notes (Signed)
Subjective:  Doing well feel some contractions but still not painful  Objective:   Vitals: Blood pressure (!) 145/94, pulse (!) 110, temperature 98.3 F (36.8 C), temperature source Oral, resp. rate 16, height 5\' 5"  (1.651 m), weight (!) 165.1 kg, last menstrual period 11/17/2020, SpO2 95 %. General: NAD Abdomen: gravid, non-tender Cervical Exam:  Dilation: 4 Effacement (%): 60, 50 Cervical Position: Anterior Station: -3 Presentation: Vertex Exam by:: Isolde Skaff MD  FHT: 135, moderate, +accels, no decels Toco: q2-17min  Results for orders placed or performed during the hospital encounter of 11/02/21 (from the past 24 hour(s))  Glucose, capillary     Status: None   Collection Time: 11/03/21 12:30 AM  Result Value Ref Range   Glucose-Capillary 94 70 - 99 mg/dL  Glucose, capillary     Status: Abnormal   Collection Time: 11/03/21  2:36 AM  Result Value Ref Range   Glucose-Capillary 109 (H) 70 - 99 mg/dL  Glucose, capillary     Status: Abnormal   Collection Time: 11/03/21  4:31 AM  Result Value Ref Range   Glucose-Capillary 116 (H) 70 - 99 mg/dL  Glucose, capillary     Status: Abnormal   Collection Time: 11/03/21  6:40 AM  Result Value Ref Range   Glucose-Capillary 111 (H) 70 - 99 mg/dL  Glucose, capillary     Status: Abnormal   Collection Time: 11/03/21  8:36 AM  Result Value Ref Range   Glucose-Capillary 102 (H) 70 - 99 mg/dL  Glucose, capillary     Status: Abnormal   Collection Time: 11/03/21 10:28 AM  Result Value Ref Range   Glucose-Capillary 124 (H) 70 - 99 mg/dL  Glucose, capillary     Status: Abnormal   Collection Time: 11/03/21 11:33 AM  Result Value Ref Range   Glucose-Capillary 130 (H) 70 - 99 mg/dL  Glucose, capillary     Status: Abnormal   Collection Time: 11/03/21 12:30 PM  Result Value Ref Range   Glucose-Capillary 123 (H) 70 - 99 mg/dL  Glucose, capillary     Status: Abnormal   Collection Time: 11/03/21  1:32 PM  Result Value Ref Range    Glucose-Capillary 117 (H) 70 - 99 mg/dL  Glucose, capillary     Status: Abnormal   Collection Time: 11/03/21  2:37 PM  Result Value Ref Range   Glucose-Capillary 111 (H) 70 - 99 mg/dL  Glucose, capillary     Status: Abnormal   Collection Time: 11/03/21  3:36 PM  Result Value Ref Range   Glucose-Capillary 101 (H) 70 - 99 mg/dL  Glucose, capillary     Status: None   Collection Time: 11/03/21  5:49 PM  Result Value Ref Range   Glucose-Capillary 89 70 - 99 mg/dL  Glucose, capillary     Status: None   Collection Time: 11/03/21  6:46 PM  Result Value Ref Range   Glucose-Capillary 97 70 - 99 mg/dL  CBC     Status: Abnormal   Collection Time: 11/03/21  7:05 PM  Result Value Ref Range   WBC 9.2 4.0 - 10.5 K/uL   RBC 4.38 3.87 - 5.11 MIL/uL   Hemoglobin 11.2 (L) 12.0 - 15.0 g/dL   HCT 11/05/21 (L) 76.8 - 11.5 %   MCV 81.3 80.0 - 100.0 fL   MCH 25.6 (L) 26.0 - 34.0 pg   MCHC 31.5 30.0 - 36.0 g/dL   RDW 72.6 (H) 20.3 - 55.9 %   Platelets 253 150 - 400 K/uL   nRBC  0.0 0.0 - 0.2 %  Glucose, capillary     Status: None   Collection Time: 11/03/21  7:49 PM  Result Value Ref Range   Glucose-Capillary 97 70 - 99 mg/dL  Glucose, capillary     Status: Abnormal   Collection Time: 11/03/21  8:51 PM  Result Value Ref Range   Glucose-Capillary 107 (H) 70 - 99 mg/dL  Glucose, capillary     Status: Abnormal   Collection Time: 11/03/21 10:34 PM  Result Value Ref Range   Glucose-Capillary 101 (H) 70 - 99 mg/dL    Assessment:   25 y.o. G3P0020 [redacted]w[redacted]d IOL GDM, preeclampsia with severe features (BP criteria, normal labs, asymptomatic)  Plan:   1) Labor  - continue to titrate pitocin still not in a regular pattern, pitocin currently at 12 milliunit - internalized with FSE and IUPC   2) Fetus - cat I tracing  3) GDM - continue endo tool  4) Preeclampsia with severe features - continue magnesium sulfate - BP mild range - continue labetalol 300mg  po  , MD, Vena Austria [facility],  [facility] 11/04/2021, 12:11 AM

## 2021-11-04 NOTE — Transfer of Care (Signed)
Immediate Anesthesia Transfer of Care Note  Patient: Brylan Seubert  Procedure(s) Performed: CESAREAN SECTION  Patient Location: OB room6  Anesthesia Type:Epidural  Level of Consciousness: awake, alert  and oriented  Airway & Oxygen Therapy: Patient Spontanous Breathing and Patient connected to nasal cannula oxygen  Post-op Assessment: Report given to RN and Post -op Vital signs reviewed and stable  Post vital signs: Reviewed and stable  Last Vitals:  Vitals Value Taken Time  BP 137/102 11/04/21 1601  Temp 35.8 C 11/04/21 1601  Pulse 88 11/04/21 1603  Resp 19 11/04/21 1603  SpO2 98 % 11/04/21 1603  Vitals shown include unvalidated device data.  Last Pain:  Vitals:   11/04/21 1601  TempSrc: Oral  PainSc:       Patients Stated Pain Goal: 0 (11/04/21 0132)  Complications: No notable events documented.

## 2021-11-04 NOTE — Progress Notes (Signed)
Labor Check  Subj:  Complaints: comfortable with epidural   Obj:  BP 114/69 (BP Location: Left Arm)   Pulse 95   Temp 98 F (36.7 C) (Oral)   Resp 18   Ht _0  (1.651 m)   Wt (!) 165.1 kg   LMP 11/17/2020 (Exact Date)   SpO2 98%   BMI 60.57 kg/m  Dose (milli-units/min) Oxytocin: 0 milli-units/min  Cervix: Dilation: 4.5 / Effacement (%): 80 / Station: -3, -2  Baseline FHR: 125 beats/min   Variability: moderate with occasional periods of minimal variability. Accelerations: absent   Decelerations: absent Contractions: present frequency: 3-4 q 10 min Overall assessment: cat 1 overall, periods of minimal variability.  Female chaperone present for pelvic exam:   A/P: 25 y.o. G50P0020 female at 94w2dwith IOL for T2DM requiring insulin, on magnesium for preeclampsia with severe features. .  1.  Labor: We discussed that she has had ruptured membranes for 19 hours and on pitocin during that time. She has been on a pitocin level of 28 milli-units/min for a while.  Her contracts are still not adequate.  Her cervix is unchanged from prior exam reports, based on my exam.  She has met criteria for failed induction of labor based on ACOG's guideline for safe reduction in primary c-sections.  I discussed this with the patient and recommended c-section for delivery.   I discussed my reasoning and she voiced understanding and agreement.  She had a growth u/s showing growth of 98th%ile.   2.  FWB: reassuring overall, Overall assessment: category 1  3.  GBS negative on 11/23  4.  Pain: epidural 5.  Recheck: n/a. 6. To OR for primary LTCS.      SPrentice Docker MD, [facility]ardon[facility], [facility] 11/04/2021 1:28 PM

## 2021-11-05 ENCOUNTER — Encounter: Payer: Medicaid Other | Admitting: Obstetrics and Gynecology

## 2021-11-05 LAB — CBC
HCT: 28.8 % — ABNORMAL LOW (ref 36.0–46.0)
Hemoglobin: 8.9 g/dL — ABNORMAL LOW (ref 12.0–15.0)
MCH: 25.5 pg — ABNORMAL LOW (ref 26.0–34.0)
MCHC: 30.9 g/dL (ref 30.0–36.0)
MCV: 82.5 fL (ref 80.0–100.0)
Platelets: 270 10*3/uL (ref 150–400)
RBC: 3.49 MIL/uL — ABNORMAL LOW (ref 3.87–5.11)
RDW: 16 % — ABNORMAL HIGH (ref 11.5–15.5)
WBC: 15.6 10*3/uL — ABNORMAL HIGH (ref 4.0–10.5)
nRBC: 0 % (ref 0.0–0.2)

## 2021-11-05 LAB — GLUCOSE, CAPILLARY
Glucose-Capillary: 123 mg/dL — ABNORMAL HIGH (ref 70–99)
Glucose-Capillary: 113 mg/dL — ABNORMAL HIGH (ref 70–99)
Glucose-Capillary: 105 mg/dL — ABNORMAL HIGH (ref 70–99)

## 2021-11-05 MED ORDER — LACTATED RINGERS IV SOLN: INTRAVENOUS | Status: DC

## 2021-11-05 MED ORDER — METFORMIN HCL 500 MG PO TABS
1000.0000 mg | ORAL_TABLET | Freq: Two times a day (BID) | ORAL | Status: DC
  Administered 2021-11-05 – 2021-11-06 (×3): 1000 mg via ORAL
  Filled 2021-11-05 (×5): qty 2

## 2021-11-05 NOTE — Lactation Note (Signed)
This note was copied from a baby's chart. Lactation Consultation Note  Patient Name: Robin Houston [id number] Date: 11/05/2021 Reason for consult: Follow-up assessment;Primapara;Early term 37-38.6wks Age:26 hours  Lactation follow-up. Mom remains on Mag2+, breastfeeds and output documented overnight for baby. Mom was skin to skin with baby upon entry.  Mom reports breastfeeding has been going well and last 3 feedings have been without the nipple shield. Mom does ask about formula (breast and formula her admission choice)- when asked about why mom voices she likes breastfeeding but unsure about doing it "all the time". Education was given on the importance of delaying the introduction of supplement due to building supply appropriate for baby's needs. LC also provided reassurance that we would honor her wishes, and discussed options of both breast and bottle feeding. Encouraged mom to think about what they would look like and what would make the most sense at home for them. Mom agrees to wait for formula at this time and will think it over.  Lactation to follow-up once on MBU.  Maternal Data Has patient been taught Hand Expression?: Yes Does the patient have breastfeeding experience prior to this delivery?: No  Feeding Mother's Current Feeding Choice: Breast Milk and Formula  LATCH Score                    Lactation Tools Discussed/Used    Interventions Interventions: Breast feeding basics reviewed;Education  Discharge    Consult Status Consult Status: Follow-up Date: 11/05/21 Follow-up type: In-patient    Danford Bad 11/05/2021, 11:02 AM

## 2021-11-05 NOTE — Anesthesia Postprocedure Evaluation (Signed)
Anesthesia Post Note  Patient: Robin Houston  Procedure(s) Performed: CESAREAN SECTION  Patient location during evaluation: Mother Baby Anesthesia Type: Epidural Level of consciousness: oriented and awake and alert Pain management: pain level controlled Vital Signs Assessment: post-procedure vital signs reviewed and stable Respiratory status: spontaneous breathing and respiratory function stable Cardiovascular status: blood pressure returned to baseline and stable Postop Assessment: no headache, no backache, no apparent nausea or vomiting and able to ambulate Anesthetic complications: no   No notable events documented.   Last Vitals:  Vitals:   11/05/21 0635 11/05/21 0706  BP:  108/61  Pulse:  88  Resp:  (!) 28  Temp:  37.1 C  SpO2: 96%     Last Pain:  Vitals:   11/05/21 0706  TempSrc: Oral  PainSc: 2                  Starling Manns

## 2021-11-05 NOTE — TOC Initial Note (Signed)
Transition of Care [facility]) - Initial/Assessment Note    Patient Details  Name: Robin Houston MRN: [id number] Date of Birth: 06/05/1996  [facility] CM/SW Contact:    Ellsinore Cellar, RN Phone Number: 11/05/2021, 4:35 PM  Clinical Narrative:                 Spoke with patient and FOB at bedside. Patient was sitting up on EOB while FOB was holding infant, Aiden. MOB very pleasant and reports she is feeling great. Patient reports no history of sleep apnea or need for Oxygen and reports she has not been told anything about going home with a wound vac. Has no issues with transportation and no history of anxiety/depression. Strong support system and planning to formula and breastfeed. Planning to enroll with [facility] services. Discussed importance of avoiding sick contacts and proper hand hygiene. Reviewed PPD in detail including warning signs/resources. MOB is not working currently and will be home with infant for several weeks before searching for a job. No current TOC needs. Will continue to follow for any issues that develop.         Patient Goals and CMS Choice        Expected Discharge Plan and Services                                                Prior Living Arrangements/Services                       Activities of Daily Living Home Assistive Devices/Equipment: None ADL Screening (condition at time of admission) Patient's cognitive ability adequate to safely complete daily activities?: Yes Is the patient deaf or have difficulty hearing?: No Does the patient have difficulty seeing, even when wearing glasses/contacts?: No Does the patient have difficulty concentrating, remembering, or making decisions?: No Patient able to express need for assistance with ADLs?: Yes Does the patient have difficulty dressing or bathing?: No Independently performs ADLs?: Yes (appropriate for developmental age) Does the patient have difficulty walking or climbing stairs?:  No Weakness of Legs: None Weakness of Arms/Hands: None  Permission Sought/Granted                  Emotional Assessment              Admission diagnosis:  Supervision of high-risk pregnancy, third trimester [O09.93] Patient Active Problem List   Diagnosis Date Noted   Preeclampsia, severe, third trimester 11/04/2021   [redacted] weeks gestation of pregnancy 11/04/2021   Supervision of high-risk pregnancy, third trimester 11/02/2021   NST (non-stress test) nonreactive 10/15/2021   Obesity affecting pregnancy in second trimester 04/15/2021   Pregnancy complicated by pre-existing type 2 diabetes 04/15/2021   Supervision of high-risk pregnancy 04/08/2021   Symptomatic anemia 03/31/2020   PCP:  Patient, No Pcp Per (Inactive) Pharmacy:   [id number] [place], [place] - [address] [place] [address] Phone: [phone] Fax: [phone]     Social Determinants of Health (SDOH) Interventions    Readmission Risk Interventions No flowsheet data found.

## 2021-11-05 NOTE — Progress Notes (Signed)
Admit Date: 11/02/2021 Today's Date: 11/05/2021  Subjective: Postpartum Day 1: Cesarean Delivery Patient reports incisional pain and tolerating PO.   She denies headache, blurry vision, CP, SOB, epigastric pain. She has been on Magnesium for preeclampsia since her CS yesterday.   Objective: Vital signs in last 24 hours: Temp:  [96.4 F (35.8 C)-99.2 F (37.3 C)] 98.8 F (37.1 C) (12/01 0418) Pulse Rate:  [84-103] 88 (12/01 0706) Resp:  [0-29] 16 (12/01 0606) BP: (92-154)/(42-102) 108/61 (12/01 0706) SpO2:  [71 %-98 %] 96 % (12/01 8938)  Physical Exam:  General: alert, cooperative, and no distress Lochia: appropriate Uterine Fundus: firm Incision: healing well, no significant drainage, no dehiscence, no significant erythema DVT Evaluation: No evidence of DVT seen on physical exam. Negative Homan's sign. No cords or calf tenderness. No significant calf/ankle edema.  UOP >100 mL per hour last few hours  Recent Labs    11/04/21 2155 11/05/21 0607  HGB 9.7* 8.9*  HCT 32.0* 28.8*    Assessment/Plan: Preeclampsia, Diabetes, Obesity, s/p Cesarean Delivery for FTP. Status post Cesarean section. Doing well postoperatively.  Continue current care. D/C magnesium now. D/C foley later this am, ambulate, and advance diet. BP normal thru night.  Cont to monitor.  Labetalol if needs to cont w therapy. SCDs and Lovenox for DVT prophylaxis. For Diabetes, will revert back to Metformin 1000 mg BID (prepregnancy regimen).  Can cont to check BS to ensure no high spikes.  Letitia Libra 11/05/2021, 7:18 AM

## 2021-11-05 NOTE — Lactation Note (Signed)
This note was copied from a baby's chart. Lactation Consultation Note  Patient Name: Boy Jerelyn Trimarco [id number] Date: 11/05/2021 Reason for consult: Mother's request;Early term 37-38.6wks;Other (Comment) (formula) Age:26 hours  Mom asked for formula for this next feeding. She is tired and would like to offer the bottle. We had previously discussed the drawbacks of providing formula early on in her breastfeeding journey but let her know that we would support her decision.  Encouraged breastfeeding at next feeding cue. 1 formula bottle with gradufeeder and slow flow nipple provided; dad taught paced-bottle feeding technique; encouraged frequent breaks and burping. Volume to start at 59mL and increase by 80mL as necessary.  Parents verbalize understanding.  Maternal Data Has patient been taught Hand Expression?: Yes Does the patient have breastfeeding experience prior to this delivery?: No  Feeding Mother's Current Feeding Choice: Breast Milk and Formula Nipple Type: Slow - flow  LATCH Score Latch: Too sleepy or reluctant, no latch achieved, no sucking elicited.  Audible Swallowing: Spontaneous and intermittent  Type of Nipple: Everted at rest and after stimulation  Comfort (Breast/Nipple): Soft / non-tender  Hold (Positioning): Assistance needed to correctly position infant at breast and maintain latch.  LATCH Score: 7   Lactation Tools Discussed/Used Tools: Bottle Nipple shield size: 20  Interventions Interventions: Pace feeding  Discharge    Consult Status Consult Status: Follow-up Date: 11/06/21 Follow-up type: In-patient    Danford Bad 11/05/2021, 4:43 PM

## 2021-11-06 LAB — BASIC METABOLIC PANEL
Anion gap: 3 — ABNORMAL LOW (ref 5–15)
BUN: 20 mg/dL (ref 6–20)
CO2: 26 mmol/L (ref 22–32)
Calcium: 7.9 mg/dL — ABNORMAL LOW (ref 8.9–10.3)
Chloride: 105 mmol/L (ref 98–111)
Creatinine, Ser: 0.78 mg/dL (ref 0.44–1.00)
GFR, Estimated: >60 mL/min (ref >60)
Glucose, Bld: 140 mg/dL — ABNORMAL HIGH (ref 70–99)
Potassium: 4.2 mmol/L (ref 3.5–5.1)
Sodium: 134 mmol/L — ABNORMAL LOW (ref 135–145)

## 2021-11-06 LAB — CBC
HCT: 27.5 % — ABNORMAL LOW (ref 36.0–46.0)
Hemoglobin: 8.2 g/dL — ABNORMAL LOW (ref 12.0–15.0)
MCH: 25 pg — ABNORMAL LOW (ref 26.0–34.0)
MCHC: 29.8 g/dL — ABNORMAL LOW (ref 30.0–36.0)
MCV: 83.8 fL (ref 80.0–100.0)
Platelets: 255 10*3/uL (ref 150–400)
RBC: 3.28 MIL/uL — ABNORMAL LOW (ref 3.87–5.11)
RDW: 16.3 % — ABNORMAL HIGH (ref 11.5–15.5)
WBC: 12.3 10*3/uL — ABNORMAL HIGH (ref 4.0–10.5)
nRBC: 0 % (ref 0.0–0.2)

## 2021-11-06 LAB — GLUCOSE, CAPILLARY: Glucose-Capillary: 72 mg/dL (ref 70–99)

## 2021-11-06 MED ORDER — OXYCODONE HCL 5 MG PO TABS: 5.0000 mg | ORAL_TABLET | Freq: Four times a day (QID) | ORAL | 0 refills | Status: AC | PRN

## 2021-11-06 MED ORDER — ENOXAPARIN SODIUM 80 MG/0.8ML IJ SOSY: 80.0000 mg | PREFILLED_SYRINGE | INTRAMUSCULAR | 1 refills | Status: DC

## 2021-11-06 NOTE — Lactation Note (Addendum)
This note was copied from a baby's chart. Lactation Consultation Note  Patient Name: Robin Houston [id number] Date: 11/06/2021 Reason for consult: Follow-up assessment;Primapara;Early term 37-38.6wks Age:26 hours  Maternal Data Has patient been taught Hand Expression?: Yes Does the patient have breastfeeding experience prior to this delivery?: No  Feeding Mother's Current Feeding Choice: Breast Milk and Formula Nipple Type: Slow - flow Mom states wants to pump and bottle feed baby EBM, she plans on applying for [facility], family may buy her a breast pump, I gave her a Harmony breast pump and instructed her in use, she pumped both breasts and obtained 1 oz of EBM, she fed this to baby who took 25cc by bottle and slow flow nipple   LATCH Score                    Lactation Tools Discussed/Used  LC name and no written on white board  Interventions Interventions: Hand pump;Education  Discharge Pump: Manual (given a Medela Harmony breast pump) WIC Program: No (would like to apply, given [facility] pamphlet and phone no.)  Consult Status Consult Status: PRN Date: 11/06/21 Follow-up type: In-patient    Dyann Kief 11/06/2021, 11:18 AM

## 2021-11-06 NOTE — Discharge Instructions (Addendum)
Discharge Instructions:   Postpartum follow-up appointment for incision check and wound vac removal: Tuesday, 12/6 at 11:10am with Dr. Bonney Aid at [facility]!   If there are any new medications, they have been ordered and will be available for pickup at the listed pharmacy on your way home from the hospital.   Call office if you have any of the following: headache, visual changes, fever >101.0 F, chills, shortness of breath, breast concerns, excessive vaginal bleeding, incision drainage or problems, leg pain or redness, depression or any other concerns. If you have vaginal discharge with an odor, let your doctor know.   It is normal to bleed for up to 6 weeks. You should not soak through more than 1 pad in 1 hour. If you have a blood clot larger than your fist with continued bleeding, call your doctor.   After a c-section, you should expect a small amount of blood or clear fluid coming from the incision and abdominal cramping/soreness. Inspect your incision site daily. Stand in front of a mirror to look for any redness, incision opening, or discolored/odorness drainage. Take a shower daily and continue good hygiene. Use own towel and washcloth (do not share). Make sure your sheets on your bed are clean. No pets sleeping around your incision site. Dressing will be removed at your postpartum visit. If the dressing does become wet or soiled underneath, it is okay to remove it.    Activity: Do not lift > 10 lbs for 6 weeks (do not lift anything heavier than your baby). No intercourse, tampons, swimming pools, hot tubs, baths (only showers) for 6 weeks.  No driving for 1-2 weeks. Continue prenatal vitamin, especially if breastfeeding. Increase calories and fluids (water) while breastfeeding.   Your milk will come in, in the next couple of days (right now it is colostrum). You may have a slight fever when your milk comes in, but it should go away on its own.  If it does not, and rises above 101 F  please call the doctor. You will also feel achy and your breasts will be firm. They will also start to leak. If you are breastfeeding, continue as you have been and you can pump/express milk for comfort.   If you have too much milk, your breasts can become engorged, which could lead to mastitis. This is an infection of the milk ducts. It can be very painful and you will need to notify your doctor to obtain a prescription for antibiotics. You can also treat it with a shower or hot/cold compress.   For concerns about your baby, please call your pediatrician.  For breastfeeding concerns, the lactation consultant can be reached at [phone].   Postpartum blues (feelings of happy one minute and sad another minute) are normal for the first few weeks but if it gets worse let your doctor know.   Congratulations! We enjoyed caring for you and your new bundle of joy!

## 2021-11-10 ENCOUNTER — Encounter: Payer: Self-pay | Admitting: Obstetrics and Gynecology

## 2021-11-10 ENCOUNTER — Ambulatory Visit (INDEPENDENT_AMBULATORY_CARE_PROVIDER_SITE_OTHER): Payer: Medicaid Other | Admitting: Obstetrics and Gynecology

## 2021-11-10 ENCOUNTER — Other Ambulatory Visit: Payer: Self-pay

## 2021-11-10 VITALS — BP 136/92 | Ht 65.0 in | Wt 340.0 lb

## 2021-11-10 DIAGNOSIS — Z4889 Encounter for other specified surgical aftercare: Secondary | ICD-10-CM

## 2021-11-17 NOTE — Progress Notes (Signed)
Postoperative Follow-up Patient presents post op from [facility] ago for  failure .  Subjective: Patient reports marked improvement in her preop symptoms. Eating a regular diet without difficulty. The patient is not having any pain.  Activity: normal activities of daily living.  Objective: Blood pressure (!) 136/92, height 5\' 5"  (1.651 m), weight (!) 340 lb (154.2 kg), currently breastfeeding.  General: NAD Pulmonary: no increased work of breathing Abdomen: soft, non-tender, non-distended, incision(s) D/C/I Extremities: no edema Neurologic: normal gait   Admission on 11/02/2021, Discharged on 11/06/2021  Component Date Value Ref Range Status   WBC 11/02/2021 9.3  4.0 - 10.5 K/uL Final   RBC 11/02/2021 4.01  3.87 - 5.11 MIL/uL Final   Hemoglobin 11/02/2021 10.1 (L)  12.0 - 15.0 g/dL Final   HCT 11/04/2021 33.2 (L)  36.0 - 46.0 % Final   MCV 11/02/2021 82.8  80.0 - 100.0 fL Final   MCH 11/02/2021 25.2 (L)  26.0 - 34.0 pg Final   MCHC 11/02/2021 30.4  30.0 - 36.0 g/dL Final   RDW 11/04/2021 15.4  11.5 - 15.5 % Final   Platelets 11/02/2021 279  150 - 400 K/uL Final   nRBC 11/02/2021 0.2  0.0 - 0.2 % Final   Performed at [facility], [address] [place] [place], [place]   ABO/RH(D) 11/02/2021 A POS   Final   Antibody Screen 11/02/2021 NEG   Final   Sample Expiration 11/02/2021    Final                   Value:11/05/2021,2359 Performed at [facility], [address] [place], [place]    RPR Ser Ql 11/02/2021 NON REACTIVE  NON REACTIVE Final   Performed at [facility], [address], [place], [place]   Sodium 11/02/2021 134 (L)  135 - 145 mmol/L Final   Potassium 11/02/2021 3.4 (L)  3.5 - 5.1 mmol/L Final   Chloride 11/02/2021 100  98 - 111 mmol/L Final   CO2 11/02/2021 26  22 - 32 mmol/L Final   Glucose, Bld 11/02/2021 193 (H)  70 - 99 mg/dL Final   Glucose reference range applies only to samples taken after  fasting for at least 8 hours.   BUN 11/02/2021 11  6 - 20 mg/dL Final   Creatinine, Ser 11/02/2021 0.57  0.44 - 1.00 mg/dL Final   Calcium 11/04/2021 8.5 (L)  8.9 - 10.3 mg/dL Final   Total Protein 88/41/6606 6.5  6.5 - 8.1 g/dL Final   Albumin 30/16/0109 2.5 (L)  3.5 - 5.0 g/dL Final   AST 32/35/5732 18  15 - 41 U/L Final   ALT 11/02/2021 13  0 - 44 U/L Final   Alkaline Phosphatase 11/02/2021 94  38 - 126 U/L Final   Total Bilirubin 11/02/2021 0.3  0.3 - 1.2 mg/dL Final   GFR, Estimated 11/02/2021 >60  >60 mL/min Final   Comment: (NOTE) Calculated using the CKD-EPI Creatinine Equation (2021)    Anion gap 11/02/2021 8  5 - 15 Final   Performed at [facility], [address] [place], [place]   Creatinine, Urine 11/02/2021 91  mg/dL Final   Total Protein, Urine 11/02/2021 11  mg/dL Final   NO NORMAL RANGE ESTABLISHED FOR THIS TEST   Protein Creatinine Ratio 11/02/2021 0.12  0.00 - 0.15 mg/mg[Cre] Final   Performed at [facility], [address] [place], [place]  SARS Coronavirus 2 by RT PCR 11/02/2021 NEGATIVE  NEGATIVE Final   Comment: (NOTE) SARS-CoV-2 target nucleic acids are NOT DETECTED.  The SARS-CoV-2 RNA is generally detectable in upper respiratory specimens during the acute phase of infection. The lowest concentration of SARS-CoV-2 viral copies this assay can detect is 138 copies/mL. A negative result does not preclude SARS-Cov-2 infection and should not be used as the sole basis for treatment or other patient management decisions. A negative result may occur with  improper specimen collection/handling, submission of specimen other than nasopharyngeal swab, presence of viral mutation(s) within the areas targeted by this assay, and inadequate number of viral copies(<138 copies/mL). A negative result must be combined with clinical observations, patient history, and epidemiological information. The expected result is  Negative.  Fact Sheet for Patients:  BloggerCourse.com  Fact Sheet for Healthcare Providers:  SeriousBroker.it  This test is no                          t yet approved or cleared by the [place] FDA and  has been authorized for detection and/or diagnosis of SARS-CoV-2 by FDA under an Emergency Use Authorization (EUA). This EUA will remain  in effect (meaning this test can be used) for the duration of the COVID-19 declaration under Section 564(b)(1) of the Act, 21 U.S.C.section 360bbb-3(b)(1), unless the authorization is terminated  or revoked sooner.       Influenza A by PCR 11/02/2021 NEGATIVE  NEGATIVE Final   Influenza B by PCR 11/02/2021 NEGATIVE  NEGATIVE Final   Comment: (NOTE) The Xpert Xpress SARS-CoV-2/FLU/RSV plus assay is intended as an aid in the diagnosis of influenza from Nasopharyngeal swab specimens and should not be used as a sole basis for treatment. Nasal washings and aspirates are unacceptable for Xpert Xpress SARS-CoV-2/FLU/RSV testing.  Fact Sheet for Patients: BloggerCourse.com  Fact Sheet for Healthcare Providers: SeriousBroker.it  This test is not yet approved or cleared by the [place] FDA and has been authorized for detection and/or diagnosis of SARS-CoV-2 by FDA under an Emergency Use Authorization (EUA). This EUA will remain in effect (meaning this test can be used) for the duration of the COVID-19 declaration under Section 564(b)(1) of the Act, 21 U.S.C. section 360bbb-3(b)(1), unless the authorization is terminated or revoked.  Performed at [facility], [address] [place], [place] [address]    Glucose-Capillary 11/02/2021 143 (H)  70 - 99 mg/dL Final   Glucose reference range applies only to samples taken after fasting for at least 8 hours.   Glucose-Capillary 11/02/2021 111 (H)  70 - 99 mg/dL Final   Glucose  reference range applies only to samples taken after fasting for at least 8 hours.   Glucose-Capillary 11/02/2021 115 (H)  70 - 99 mg/dL Final   Glucose reference range applies only to samples taken after fasting for at least 8 hours.   Gonorrhea 10/21/2021 Negative   Final   Varicella 04/08/2021 Immune   Final   Glucose-Capillary 11/02/2021 128 (H)  70 - 99 mg/dL Final   Glucose reference range applies only to samples taken after fasting for at least 8 hours.   Glucose-Capillary 11/02/2021 108 (H)  70 - 99 mg/dL Final   Glucose reference range applies only to samples taken after fasting for at least 8 hours.   Glucose-Capillary 11/02/2021 86  70 - 99 mg/dL Final   Glucose reference range applies only to samples taken after fasting for at least 8  hours.   Glucose-Capillary 11/02/2021 150 (H)  70 - 99 mg/dL Final   Glucose reference range applies only to samples taken after fasting for at least 8 hours.   Glucose-Capillary 11/02/2021 103 (H)  70 - 99 mg/dL Final   Glucose reference range applies only to samples taken after fasting for at least 8 hours.   Glucose-Capillary 11/02/2021 89  70 - 99 mg/dL Final   Glucose reference range applies only to samples taken after fasting for at least 8 hours.   Glucose-Capillary 11/02/2021 127 (H)  70 - 99 mg/dL Final   Glucose reference range applies only to samples taken after fasting for at least 8 hours.   Glucose-Capillary 11/02/2021 103 (H)  70 - 99 mg/dL Final   Glucose reference range applies only to samples taken after fasting for at least 8 hours.   Glucose-Capillary 11/02/2021 147 (H)  70 - 99 mg/dL Final   Glucose reference range applies only to samples taken after fasting for at least 8 hours.   Glucose-Capillary 11/02/2021 107 (H)  70 - 99 mg/dL Final   Glucose reference range applies only to samples taken after fasting for at least 8 hours.   Glucose-Capillary 11/02/2021 103 (H)  70 - 99 mg/dL Final   Glucose reference range applies only to  samples taken after fasting for at least 8 hours.   Glucose-Capillary 11/02/2021 94  70 - 99 mg/dL Final   Glucose reference range applies only to samples taken after fasting for at least 8 hours.   Glucose-Capillary 11/02/2021 110 (H)  70 - 99 mg/dL Final   Glucose reference range applies only to samples taken after fasting for at least 8 hours.   Glucose-Capillary 11/03/2021 94  70 - 99 mg/dL Final   Glucose reference range applies only to samples taken after fasting for at least 8 hours.   Glucose-Capillary 11/03/2021 109 (H)  70 - 99 mg/dL Final   Glucose reference range applies only to samples taken after fasting for at least 8 hours.   Glucose-Capillary 11/03/2021 116 (H)  70 - 99 mg/dL Final   Glucose reference range applies only to samples taken after fasting for at least 8 hours.   Glucose-Capillary 11/03/2021 111 (H)  70 - 99 mg/dL Final   Glucose reference range applies only to samples taken after fasting for at least 8 hours.   Glucose-Capillary 11/03/2021 102 (H)  70 - 99 mg/dL Final   Glucose reference range applies only to samples taken after fasting for at least 8 hours.   Glucose-Capillary 11/03/2021 124 (H)  70 - 99 mg/dL Final   Glucose reference range applies only to samples taken after fasting for at least 8 hours.   Glucose-Capillary 11/03/2021 130 (H)  70 - 99 mg/dL Final   Glucose reference range applies only to samples taken after fasting for at least 8 hours.   Glucose-Capillary 11/03/2021 123 (H)  70 - 99 mg/dL Final   Glucose reference range applies only to samples taken after fasting for at least 8 hours.   Glucose-Capillary 11/03/2021 117 (H)  70 - 99 mg/dL Final   Glucose reference range applies only to samples taken after fasting for at least 8 hours.   Glucose-Capillary 11/03/2021 111 (H)  70 - 99 mg/dL Final   Glucose reference range applies only to samples taken after fasting for at least 8 hours.   Glucose-Capillary 11/03/2021 101 (H)  70 - 99 mg/dL Final    Glucose reference range applies only to  samples taken after fasting for at least 8 hours.   Glucose-Capillary 11/03/2021 89  70 - 99 mg/dL Final   Glucose reference range applies only to samples taken after fasting for at least 8 hours.   WBC 11/03/2021 9.2  4.0 - 10.5 K/uL Final   RBC 11/03/2021 4.38  3.87 - 5.11 MIL/uL Final   Hemoglobin 11/03/2021 11.2 (L)  12.0 - 15.0 g/dL Final   HCT 08/65/7846 35.6 (L)  36.0 - 46.0 % Final   MCV 11/03/2021 81.3  80.0 - 100.0 fL Final   MCH 11/03/2021 25.6 (L)  26.0 - 34.0 pg Final   MCHC 11/03/2021 31.5  30.0 - 36.0 g/dL Final   RDW 96/29/5284 15.9 (H)  11.5 - 15.5 % Final   Platelets 11/03/2021 253  150 - 400 K/uL Final   nRBC 11/03/2021 0.0  0.0 - 0.2 % Final   Performed at [facility], [address] [place], [place] [address]   Glucose-Capillary 11/03/2021 97  70 - 99 mg/dL Final   Glucose reference range applies only to samples taken after fasting for at least 8 hours.   Glucose-Capillary 11/03/2021 97  70 - 99 mg/dL Final   Glucose reference range applies only to samples taken after fasting for at least 8 hours.   Glucose-Capillary 11/03/2021 107 (H)  70 - 99 mg/dL Final   Glucose reference range applies only to samples taken after fasting for at least 8 hours.   Glucose-Capillary 11/03/2021 101 (H)  70 - 99 mg/dL Final   Glucose reference range applies only to samples taken after fasting for at least 8 hours.   Glucose-Capillary 11/04/2021 106 (H)  70 - 99 mg/dL Final   Glucose reference range applies only to samples taken after fasting for at least 8 hours.   WBC 11/04/2021 10.7 (H)  4.0 - 10.5 K/uL Final   RBC 11/04/2021 4.35  3.87 - 5.11 MIL/uL Final   Hemoglobin 11/04/2021 11.0 (L)  12.0 - 15.0 g/dL Final   HCT 12/08/7251 35.4 (L)  36.0 - 46.0 % Final   MCV 11/04/2021 81.4  80.0 - 100.0 fL Final   MCH 11/04/2021 25.3 (L)  26.0 - 34.0 pg Final   MCHC 11/04/2021 31.1  30.0 - 36.0 g/dL Final   RDW 66/44/0347 15.9 (H)  11.5  - 15.5 % Final   Platelets 11/04/2021 269  150 - 400 K/uL Final   nRBC 11/04/2021 0.0  0.0 - 0.2 % Final   Performed at [facility], [address] [place], [place] [address]   Glucose-Capillary 11/04/2021 103 (H)  70 - 99 mg/dL Final   Glucose reference range applies only to samples taken after fasting for at least 8 hours.   Glucose-Capillary 11/04/2021 103 (H)  70 - 99 mg/dL Final   Glucose reference range applies only to samples taken after fasting for at least 8 hours.   Glucose-Capillary 11/04/2021 103 (H)  70 - 99 mg/dL Final   Glucose reference range applies only to samples taken after fasting for at least 8 hours.   Glucose-Capillary 11/04/2021 100 (H)  70 - 99 mg/dL Final   Glucose reference range applies only to samples taken after fasting for at least 8 hours.   Glucose-Capillary 11/04/2021 110 (H)  70 - 99 mg/dL Final   Glucose reference range applies only to samples taken after fasting for at least 8 hours.   Magnesium 11/04/2021 6.1 (HH)  1.7 - 2.4 mg/dL Final   Comment: CRITICAL RESULT  CALLED TO, READ BACK BY AND VERIFIED WITH BRITTANY NELSON 11/04/21 1403 MU Performed at [facility], [address] [place], [place] [address]    Platelets 11/04/2021 287  150 - 400 K/uL Final   Performed at [facility], [address] [place], [place] [address]   Glucose-Capillary 11/04/2021 107 (H)  70 - 99 mg/dL Final   Glucose reference range applies only to samples taken after fasting for at least 8 hours.   WBC 11/04/2021 14.0 (H)  4.0 - 10.5 K/uL Final   RBC 11/04/2021 3.90  3.87 - 5.11 MIL/uL Final   Hemoglobin 11/04/2021 9.7 (L)  12.0 - 15.0 g/dL Final   HCT 09/81/1914 32.0 (L)  36.0 - 46.0 % Final   MCV 11/04/2021 82.1  80.0 - 100.0 fL Final   MCH 11/04/2021 24.9 (L)  26.0 - 34.0 pg Final   MCHC 11/04/2021 30.3  30.0 - 36.0 g/dL Final   RDW 78/29/5621 15.9 (H)  11.5 - 15.5 % Final   Platelets 11/04/2021 266  150 - 400 K/uL Final   nRBC  11/04/2021 0.0  0.0 - 0.2 % Final   Performed at [facility], [address] [place], [place] [address]   WBC 11/04/2021 10.4  4.0 - 10.5 K/uL Final   RBC 11/04/2021 4.27  3.87 - 5.11 MIL/uL Final   Hemoglobin 11/04/2021 10.8 (L)  12.0 - 15.0 g/dL Final   HCT 78/46/9629 35.4 (L)  36.0 - 46.0 % Final   MCV 11/04/2021 82.9  80.0 - 100.0 fL Final   MCH 11/04/2021 25.3 (L)  26.0 - 34.0 pg Final   MCHC 11/04/2021 30.5  30.0 - 36.0 g/dL Final   RDW 52/84/1324 16.1 (H)  11.5 - 15.5 % Final   Platelets 11/04/2021 284  150 - 400 K/uL Final   nRBC 11/04/2021 0.0  0.0 - 0.2 % Final   Performed at [facility], [address] [place], [place] [address]   Creatinine, Ser 11/04/2021 0.69  0.44 - 1.00 mg/dL Final   GFR, Estimated 11/04/2021 >60  >60 mL/min Final   Comment: (NOTE) Calculated using the CKD-EPI Creatinine Equation (2021) Performed at [facility], [address] [place], [place] [address]    WBC 11/05/2021 15.6 (H)  4.0 - 10.5 K/uL Final   RBC 11/05/2021 3.49 (L)  3.87 - 5.11 MIL/uL Final   Hemoglobin 11/05/2021 8.9 (L)  12.0 - 15.0 g/dL Final   HCT 64/40/3474 28.8 (L)  36.0 - 46.0 % Final   MCV 11/05/2021 82.5  80.0 - 100.0 fL Final   MCH 11/05/2021 25.5 (L)  26.0 - 34.0 pg Final   MCHC 11/05/2021 30.9  30.0 - 36.0 g/dL Final   RDW 25/95/6387 16.0 (H)  11.5 - 15.5 % Final   Platelets 11/05/2021 270  150 - 400 K/uL Final   nRBC 11/05/2021 0.0  0.0 - 0.2 % Final   Performed at [facility], [address] [place], [place] [address]   Glucose-Capillary 11/04/2021 171 (H)  70 - 99 mg/dL Final   Glucose reference range applies only to samples taken after fasting for at least 8 hours.   Glucose-Capillary 11/05/2021 123 (H)  70 - 99 mg/dL Final   Glucose reference range applies only to samples taken after fasting for at least 8 hours.   WBC 11/06/2021 12.3 (H)  4.0 - 10.5 K/uL Final   RBC 11/06/2021 3.28 (L)  3.87 - 5.11 MIL/uL Final    Hemoglobin  11/06/2021 8.2 (L)  12.0 - 15.0 g/dL Final   HCT 96/03/5408 27.5 (L)  36.0 - 46.0 % Final   MCV 11/06/2021 83.8  80.0 - 100.0 fL Final   MCH 11/06/2021 25.0 (L)  26.0 - 34.0 pg Final   MCHC 11/06/2021 29.8 (L)  30.0 - 36.0 g/dL Final   RDW 81/19/1478 16.3 (H)  11.5 - 15.5 % Final   Platelets 11/06/2021 255  150 - 400 K/uL Final   nRBC 11/06/2021 0.0  0.0 - 0.2 % Final   Performed at [facility], [address] [place], [place] [address]   Sodium 11/06/2021 134 (L)  135 - 145 mmol/L Final   Potassium 11/06/2021 4.2  3.5 - 5.1 mmol/L Final   Chloride 11/06/2021 105  98 - 111 mmol/L Final   CO2 11/06/2021 26  22 - 32 mmol/L Final   Glucose, Bld 11/06/2021 140 (H)  70 - 99 mg/dL Final   Glucose reference range applies only to samples taken after fasting for at least 8 hours.   BUN 11/06/2021 20  6 - 20 mg/dL Final   Creatinine, Ser 11/06/2021 0.78  0.44 - 1.00 mg/dL Final   Calcium 13/07/6577 7.9 (L)  8.9 - 10.3 mg/dL Final   GFR, Estimated 11/06/2021 >60  >60 mL/min Final   Comment: (NOTE) Calculated using the CKD-EPI Creatinine Equation (2021)    Anion gap 11/06/2021 3 (L)  5 - 15 Final   Performed at [facility], [address] [place], [place] [address]   Glucose-Capillary 11/05/2021 113 (H)  70 - 99 mg/dL Final   Glucose reference range applies only to samples taken after fasting for at least 8 hours.   Glucose-Capillary 11/05/2021 105 (H)  70 - 99 mg/dL Final   Glucose reference range applies only to samples taken after fasting for at least 8 hours.   Comment 1 11/05/2021 Notify RN   Final   Glucose-Capillary 11/06/2021 72  70 - 99 mg/dL Final   Glucose reference range applies only to samples taken after fasting for at least 8 hours.    Assessment: 25 y.o. s/p 1LTCS stable  Plan: Patient has done well after surgery with no apparent complications.  I have discussed the post-operative course to date, and the expected progress moving forward.   The patient understands what complications to be concerned about.  I will see the patient in routine follow up, or sooner if needed.    Activity plan: No heavy lifting.   Vena Austria, MD, [name], [facility]

## 2021-12-15 ENCOUNTER — Encounter: Payer: Medicaid Other | Admitting: Obstetrics and Gynecology

## 2021-12-18 ENCOUNTER — Ambulatory Visit (INDEPENDENT_AMBULATORY_CARE_PROVIDER_SITE_OTHER): Payer: Medicaid Other | Admitting: Obstetrics and Gynecology

## 2021-12-18 ENCOUNTER — Encounter: Payer: Self-pay | Admitting: Obstetrics and Gynecology

## 2021-12-18 ENCOUNTER — Other Ambulatory Visit: Payer: Self-pay

## 2021-12-18 DIAGNOSIS — Z30011 Encounter for initial prescription of contraceptive pills: Secondary | ICD-10-CM

## 2021-12-18 MED ORDER — NORETHINDRONE 0.35 MG PO TABS
1.0000 | ORAL_TABLET | Freq: Every day | ORAL | 11 refills | Status: DC
Start: 2021-12-18 — End: 2024-07-13

## 2021-12-18 NOTE — Progress Notes (Signed)
Postpartum Visit  Chief Complaint:  Chief Complaint  Patient presents with   Post-op Follow-up    6 wk postpartum - no concerns. RM 4    History of Present Illness: Patient is a 26 y.o. Robin Houston presents for postpartum visit.  Date of delivery: 11/04/2021 Cesarean Section:  Failed IOL Pregnancy or labor problems:  yes preeclampsia with severe features, DM 2 Any problems since the delivery:  no  Newborn Details:  SINGLETON :  1. BabyGender female. Birth weight: 3660g Maternal Details:  Breast or formula feeding: plans to breastfeed Intercourse: No  Contraception after delivery: Yes  Any bowel or bladder issues: No  Post partum depression/anxiety noted:  no Edinburgh Post-Partum Depression Score:1 Date of last PAP: 5/4/22no abnormalities   Review of Systems: Review of Systems  Constitutional: Negative.   Gastrointestinal: Negative.   Genitourinary: Negative.   Psychiatric/Behavioral: Negative.     The following portions of the patient's history were reviewed and updated as appropriate: allergies, current medications, past family history, past medical history, past social history, past surgical history, and problem list.  Past Medical History:  Past Medical History:  Diagnosis Date   Anemia    Diabetes mellitus without complication (HCC)    type 2    Past Surgical History:  Past Surgical History:  Procedure Laterality Date   CESAREAN SECTION  11/04/2021   Procedure: CESAREAN SECTION;  Surgeon: Conard Novak, MD;  Location: [facility] ORS;  Service: Obstetrics;;   no surgical history      Family History:  Family History  Problem Relation Age of Onset   Thyroid disease Mother    Diabetes Father    Diabetes Paternal Grandmother     Social History:  Social History   Socioeconomic History   Marital status: Single    Spouse name: Not on file   Number of children: Not on file   Years of education: Not on file   Highest education level: Not on file   Occupational History   Not on file  Tobacco Use   Smoking status: Never   Smokeless tobacco: Never  Vaping Use   Vaping Use: Never used  Substance and Sexual Activity   Alcohol use: Never   Drug use: Never   Sexual activity: Yes    Birth control/protection: Pill  Other Topics Concern   Not on file  Social History Narrative   Not on file   Social Determinants of Health   Financial Resource Strain: Not on file  Food Insecurity: Not on file  Transportation Needs: Not on file  Physical Activity: Not on file  Stress: Not on file  Social Connections: Not on file  Intimate Partner Violence: Not At Risk   Fear of Current or Ex-Partner: No   Emotionally Abused: No   Physically Abused: No   Sexually Abused: No    Allergies:  No Known Allergies  Medications: Prior to Admission medications   Medication Sig Start Date End Date Taking? Authorizing Provider  enoxaparin (LOVENOX) 80 MG/0.8ML injection Inject 0.8 mLs (80 mg total) into the skin daily. 11/06/21   Tresea Mall, CNM  ferrous sulfate 325 (65 FE) MG EC tablet Take 325 mg by mouth 3 (three) times daily with meals.    [provider]  metFORMIN (GLUCOPHAGE) 1000 MG tablet TAKE 1 TABLET (1,000 MG TOTAL) BY MOUTH 2 (TWO) TIMES DAILY WITH A MEAL. 07/30/21   Nadara Mustard, MD  Prenatal Vit-Fe Fumarate-FA (MULTIVITAMIN-PRENATAL) 27-0.8 MG TABS tablet Take 1  tablet by mouth daily at 12 noon.    [provider]    Physical Exam currently breastfeeding.  General: NAD HEENT: normocephalic, anicteric Pulmonary: No increased work of breathing Abdomen: NABS, soft, non-tender, non-distended.  Umbilicus without lesions.  No hepatomegaly, splenomegaly or masses palpable. No evidence of hernia. Incision D/C/I Genitourinary:  External: Normal external female genitalia.  Normal urethral meatus, normal  Bartholin's and Skene's glands.    Vagina: Normal vaginal mucosa, no evidence of prolapse.    Cervix: Grossly  normal in appearance, moderate bleeding  Uterus: Non-enlarged, mobile, normal contour.  No CMT  Adnexa: ovaries non-enlarged, no adnexal masses  Rectal: deferred Extremities: no edema, erythema, or tenderness Neurologic: Grossly intact Psychiatric: mood appropriate, affect full   Edinburgh Postnatal Depression Scale - 12/18/21 1023       Edinburgh Postnatal Depression Scale:  In the Past 7 Days   I have been able to laugh and see the funny side of things. 0    I have looked forward with enjoyment to things. 0    I have blamed myself unnecessarily when things went wrong. 0    I have been anxious or worried for no good reason. 0    I have felt scared or panicky for no good reason. 0    Things have been getting on top of me. 0    I have been so unhappy that I have had difficulty sleeping. 0    I have felt sad or miserable. 1    I have been so unhappy that I have been crying. 0    The thought of harming myself has occurred to me. 0    Edinburgh Postnatal Depression Scale Total 1             Assessment: 26 y.o. [id number] presenting for 6 week postpartum visit  Plan: Problem List Items Addressed This Visit   None Visit Diagnoses     6 weeks postpartum follow-up    -  Primary   Initiation of oral contraception           1) Contraception - Education given regarding options for contraception, as well as compatibility with breast feeding if applicable.  Patient plans on oral progesterone-only contraceptive for contraception. - discussed switching to regular OCP with cessation of breast feeding  2)  Pap - ASCCP guidelines and rational discussed.  ASCCP guidelines and rational discussed.  Patient opts for every 3 years screening interval  3) Patient underwent screening for postpartum depression with no signs of depression  4) Return in about 1 year (around 12/18/2022) for annual exam.   Vena Austria, MD, [name], [facility] 12/18/2021, 10:23  AM

## 2023-03-31 IMAGING — US US MFM OB FOLLOW-UP
1 series · 13 of 28 positions shown · non-contrast
Comparison: none

[Series 1: us mfm ob follow-up · 13 of 68 slices shown]
[im 3/68]
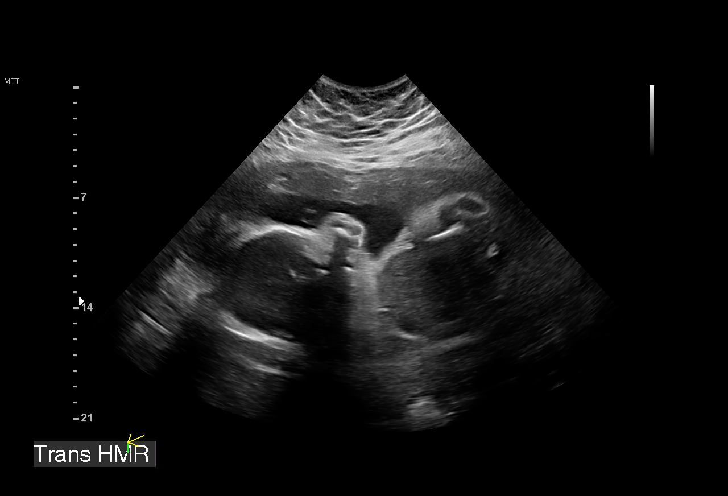
[im 8/68]
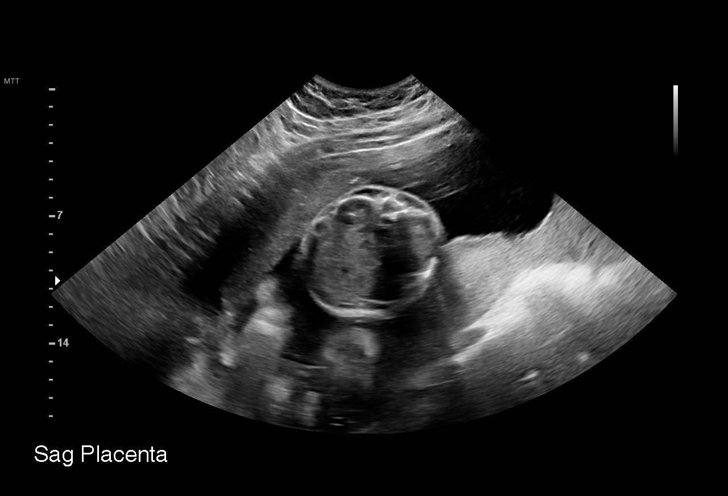
[im 13/68]
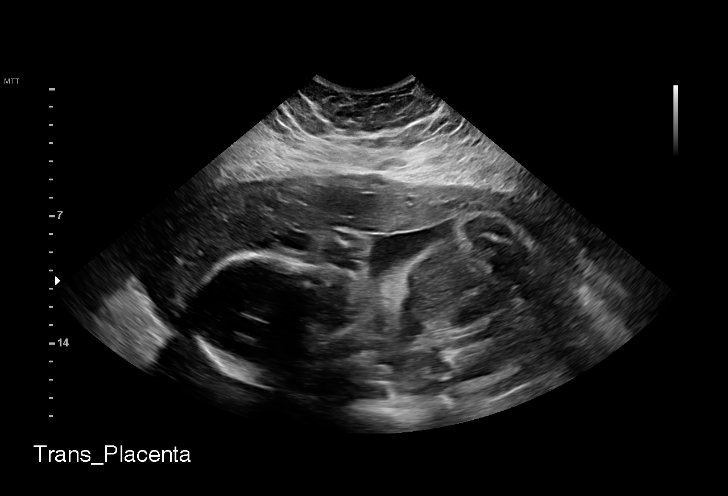
[im 18/68]
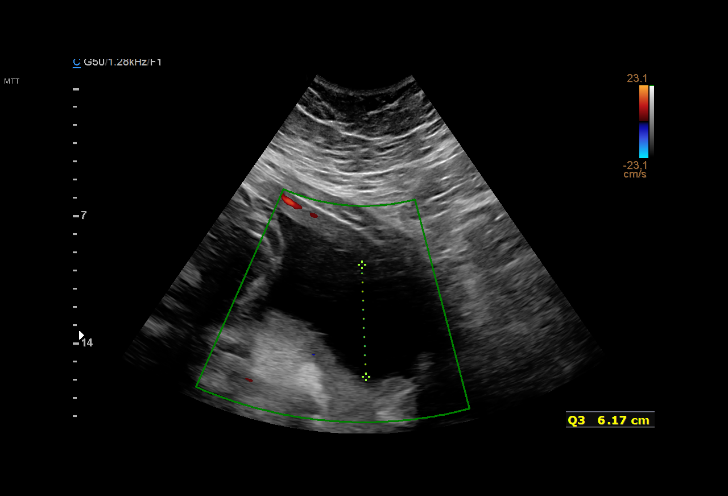
[im 23/68]
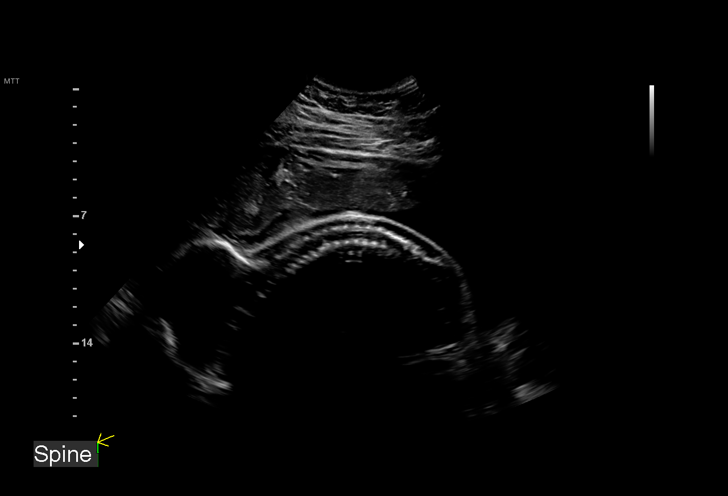
[im 28/68]
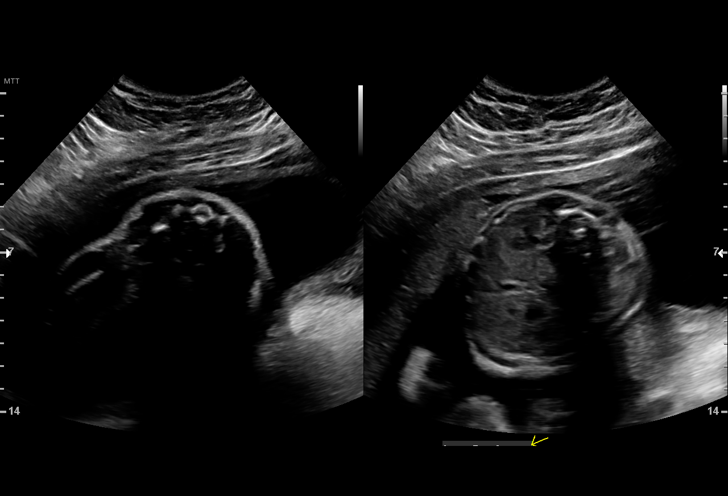
[im 35/68]
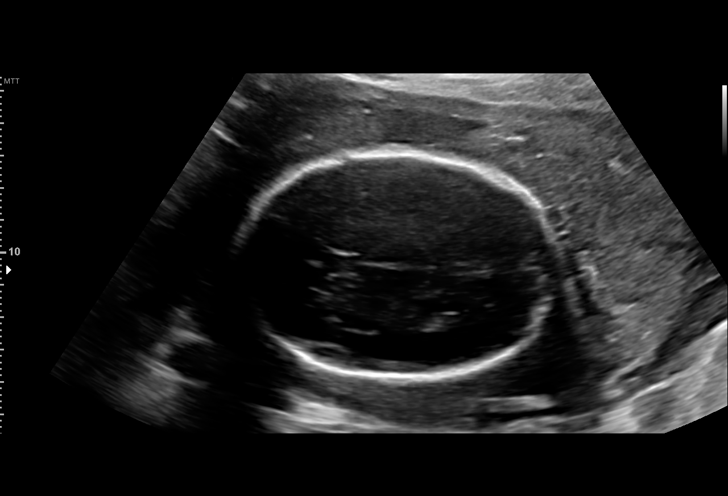
[im 40/68]
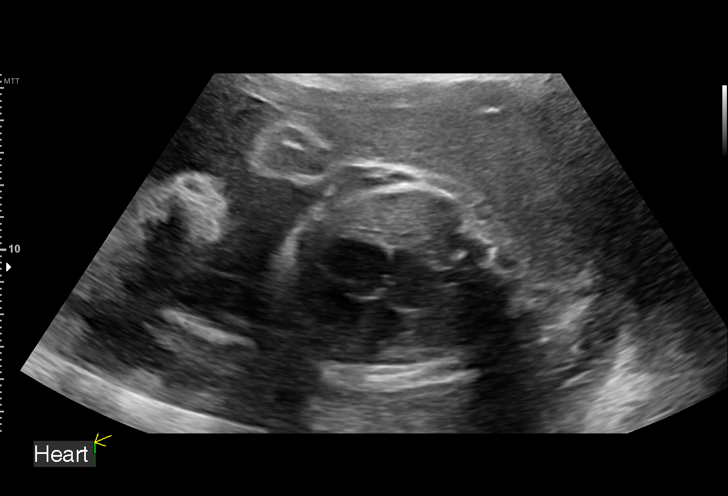
[im 45/68]
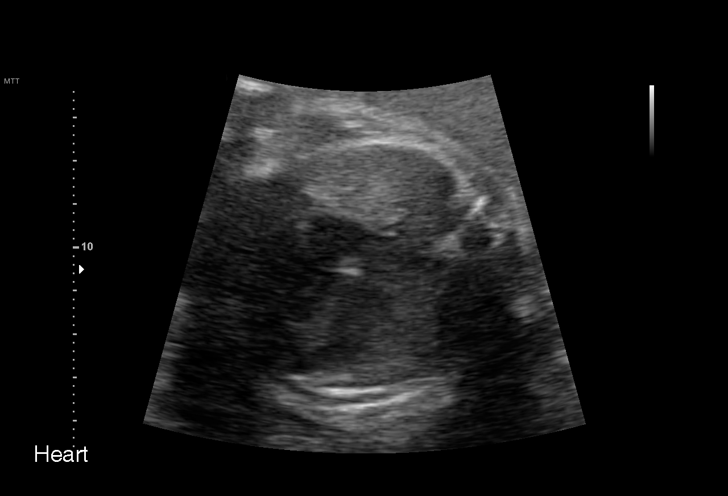
[im 50/68]
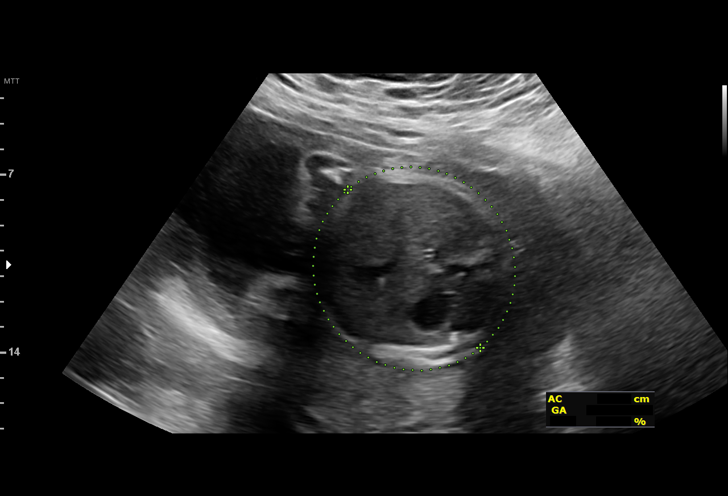
[im 55/68]
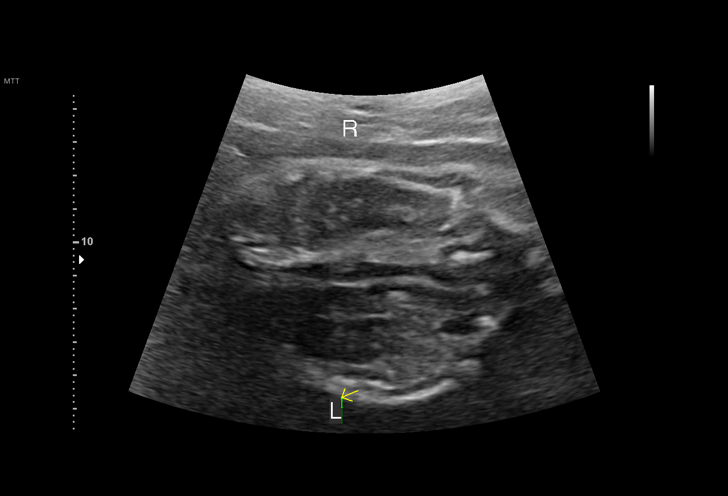
[im 60/68]
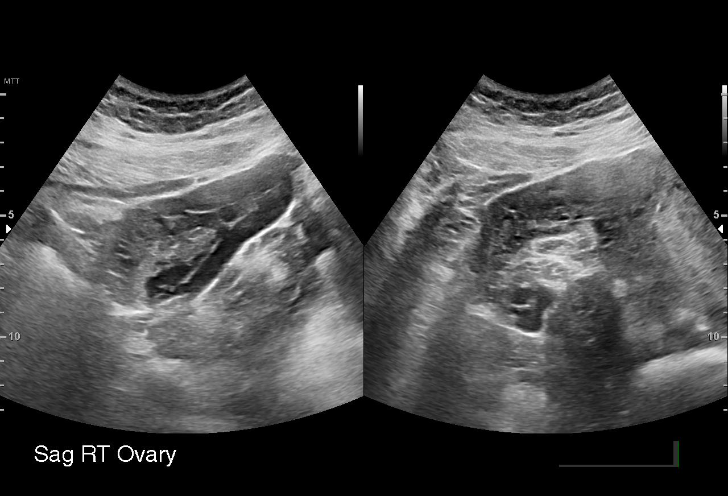
[im 65/68]
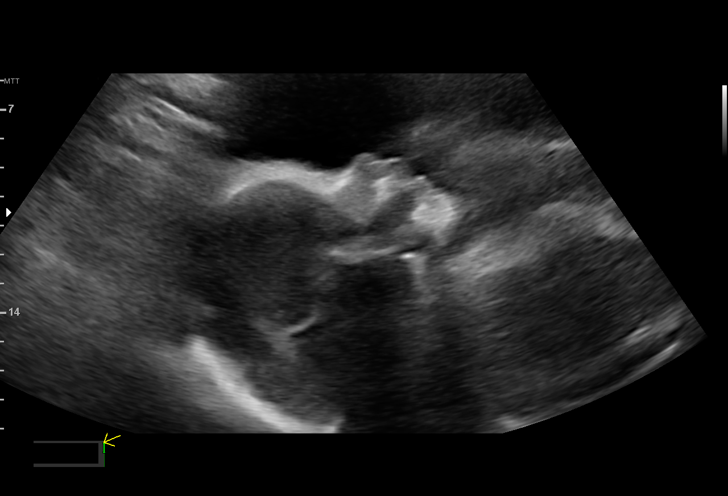

[13 of 28 positions shown; findings below may reference images not displayed]

Indications

 Diabetes - Pregestational,3rd trimester
 (Metformin & Insulin)
 Maternal morbid obesity (pregravid BMI 48)
 27 weeks gestation of pregnancy
 Encounter for other antenatal screening
 follow-up
Fetal Evaluation

 Num Of Fetuses:         1
 Fetal Heart Rate(bpm):  143
 Cardiac Activity:       Observed
 Presentation:           Variable
 Placenta:               Anterior
 P. Cord Insertion:      Previously Visualized

 Amniotic Fluid
 AFI FV:      Within normal limits

 AFI Sum(cm)     %Tile       Largest Pocket(cm)
 18.7            73

 RUQ(cm)       RLQ(cm)       LUQ(cm)        LLQ(cm)

Biometry

 BPD:      68.8  mm     G. Age:  27w 5d         41  %    CI:        66.85   %    70 - 86
                                                         FL/HC:      18.8   %    18.8 -
 HC:      269.6  mm     G. Age:  29w 3d         78  %    HC/AC:      1.07        1.05 -
 AC:      251.6  mm     G. Age:  29w 3d         89  %    FL/BPD:     73.5   %    71 - 87
 FL:       50.6  mm     G. Age:  27w 1d         23  %    FL/AC:      20.1   %    20 - 24
 CER:      36.1  mm     G. Age:  30w 0d     > 97.7  %
 LV:        4.4  mm
 Est. FW:    1806  gm    2 lb 12 oz      75  %
OB History

 Gravidity:    3         Term:   0        Prem:   0        SAB:   2
 TOP:          0       Ectopic:  0        Living: 0
Gestational Age

 LMP:           40w 4d        Date:  11/17/20                 EDD:   08/24/21
 U/S Today:     28w 3d                                        EDD:   11/17/21
 Best:          27w 4d     Det. By:  U/S C R L  (05/08/21)    EDD:   11/23/21
Anatomy

 Cranium:               Appears normal         Aortic Arch:            Previously seen
 Cavum:                 Appears normal         Ductal Arch:            Previously seen
 Ventricles:            Appears normal         Diaphragm:              Appears normal
 Choroid Plexus:        Previously seen        Stomach:                Appears normal, left
                                                                       sided
 Cerebellum:            Previously seen        Abdomen:                Previously seen
 Posterior Fossa:       Previously seen        Abdominal Wall:         Previously seen
 Nuchal Fold:           Previously seen        Cord Vessels:           Previously seen
 Face:                  Orbits nl; profile not Kidneys:                Appear normal
                        well vis.
 Lips:                  Previously seen        Bladder:                Appears normal
 Thoracic:              Previously seen        Spine:                  Limited views
                                                                       appear normal
 Heart:                 Not well visualized    Upper Extremities:      Previously
                                                                       Visualized
 RVOT:                  Not well visualized    Lower Extremities:      Previously Vsualized
 LVOT:                  Not well visualized

 Other:  Male gender previously seen. Technically difficult due to maternal
         habitus and fetal position.
Cervix Uterus Adnexa

 Cervix
 Length:           4.19  cm.
 Normal appearance by transabdominal scan.

 Uterus
 No abnormality visualized.

 Right Ovary
 Within normal limits.

 Left Ovary
 Within normal limits.

 Cul De Sac
 No free fluid seen.

 Adnexa
 No abnormality visualized.
Impression

 Type 2 diabetes.  Patient return for fetal growth assessment.
 She had fetal echocardiography that was reported as normal.
 She takes Lantus insulin 20 units at night and Humalog [DATE]
 units with meals and reports her blood glucose levels are
 better controlled now.

 Amniotic fluid is normal and good fetal activity is seen .Fetal
 growth is appropriate for gestational age .
Recommendations

 -An appointment was made for her to return in 5 weeks for
 fetal growth assessment.
 -Weekly BPP from 32 weeks gestation till delivery.
                 Japri, Zeck

## 2023-05-18 IMAGING — US US FETAL BPP W/O NONSTRESS
1 series · 14 of 18 positions shown · non-contrast
Comparison: none

CLINICAL DATA: Nonreactive nonstress test. Thirty-four week 3 day
assigned gestational age.

EXAM:
BIOPHYSICAL PROFILE

[Series 1: us fetal bpp wo non stress · 18 acquisitions, 14 frames shown]
[im 1/18]
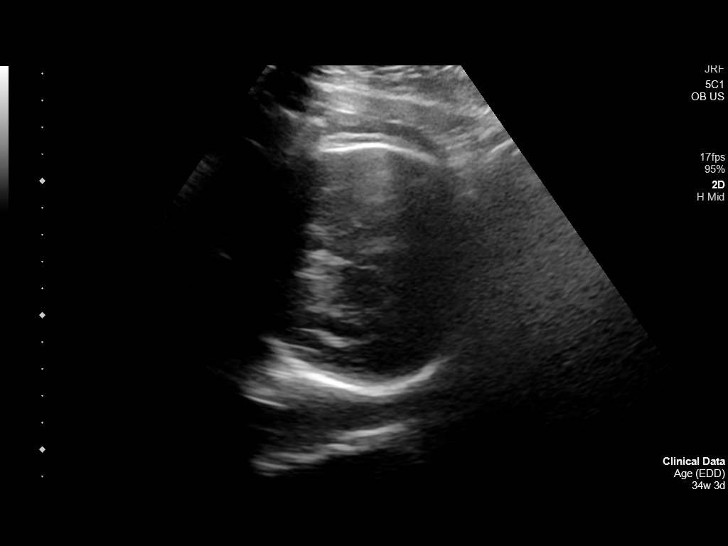
[im 2/18]
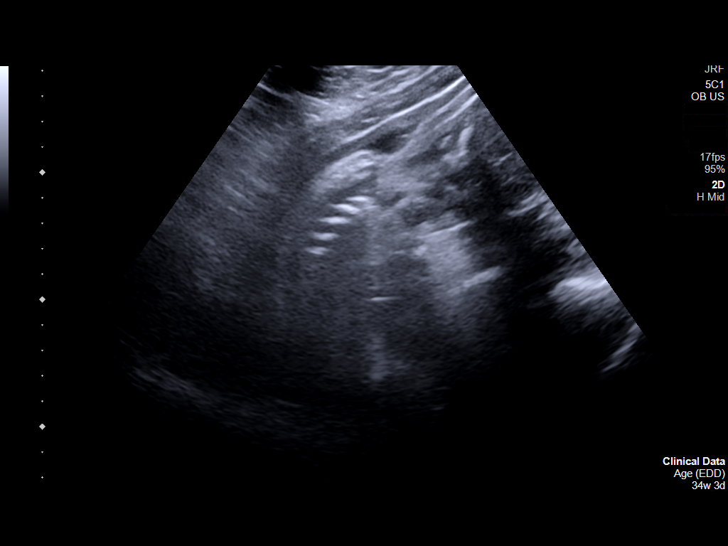
[im 4/18]
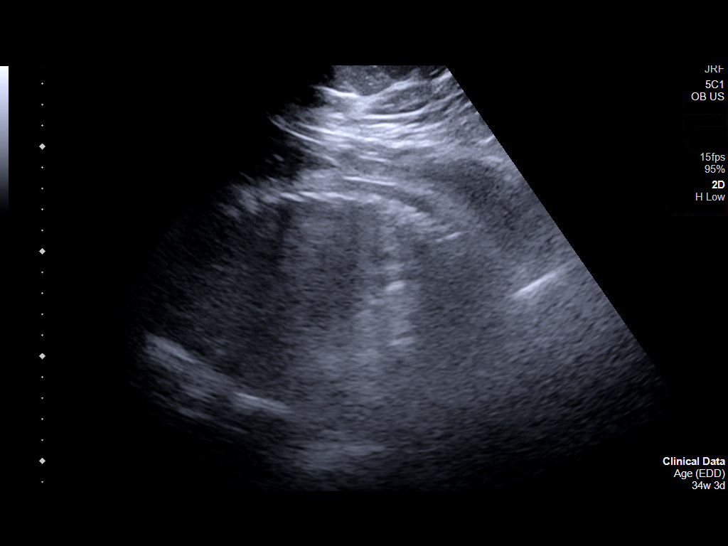
[im 5/18]
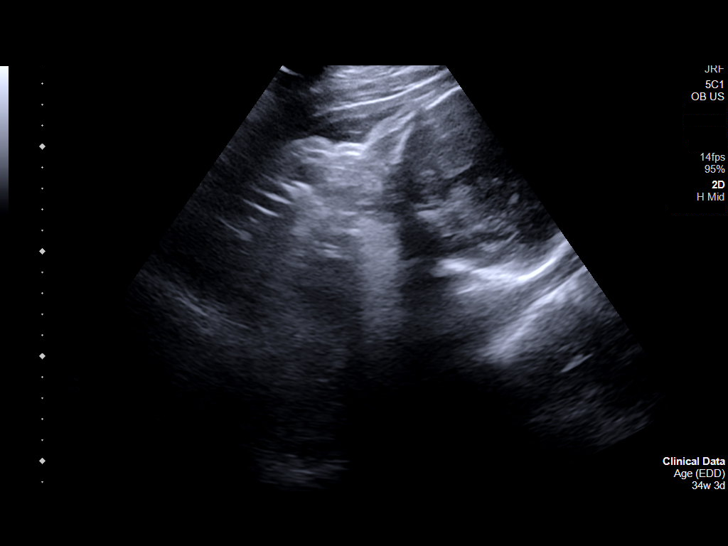
[im 6/18]
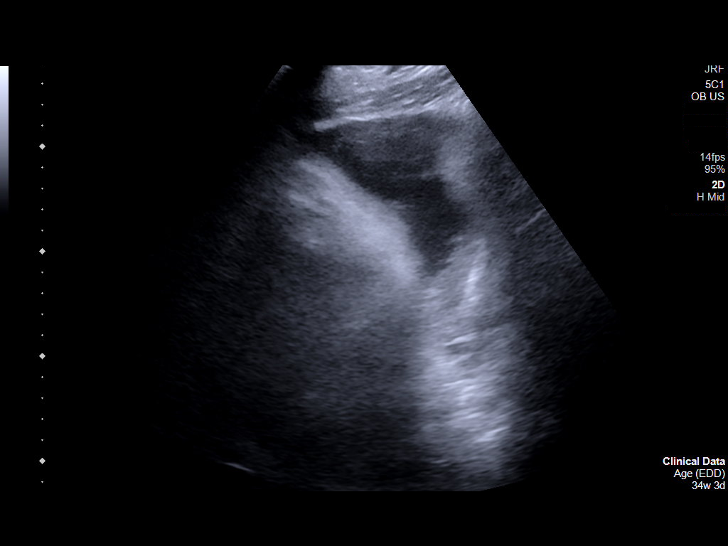
[im 8/18]
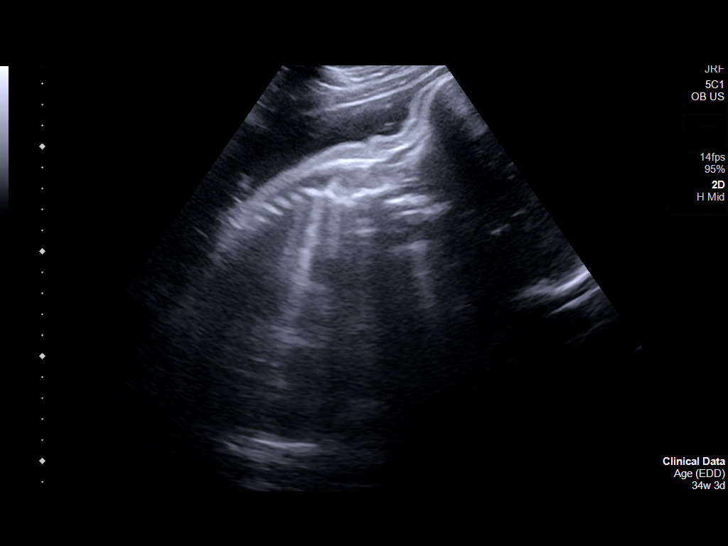
[im 9/18]
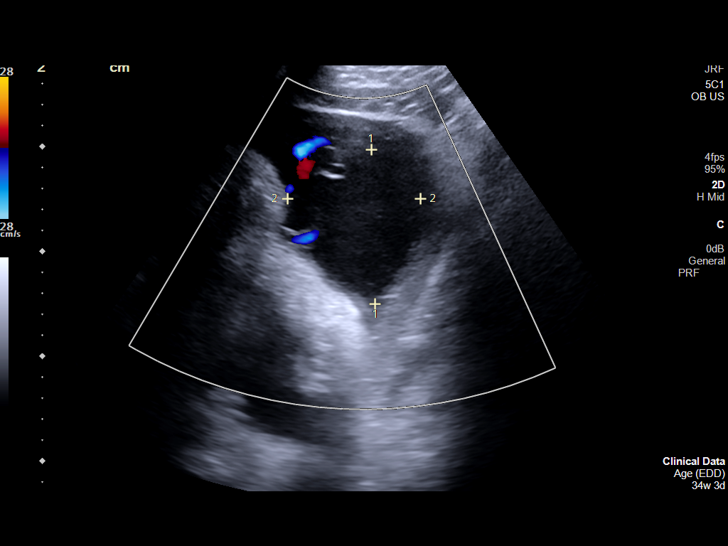
[im 10/18]
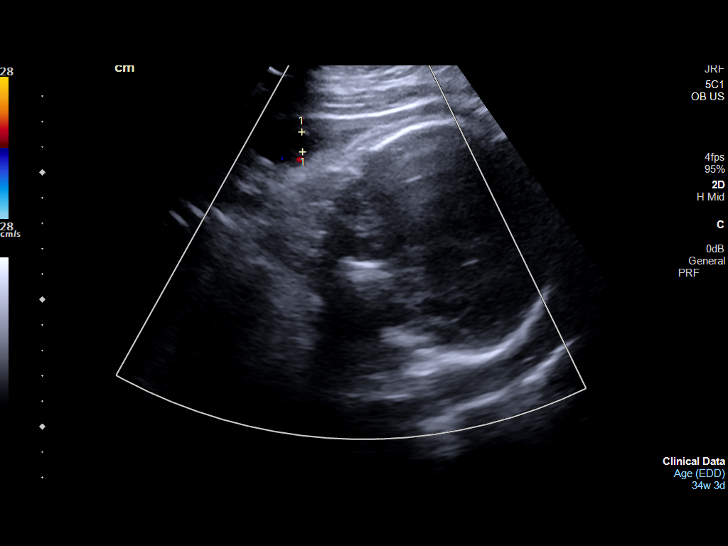
[im 11/18]
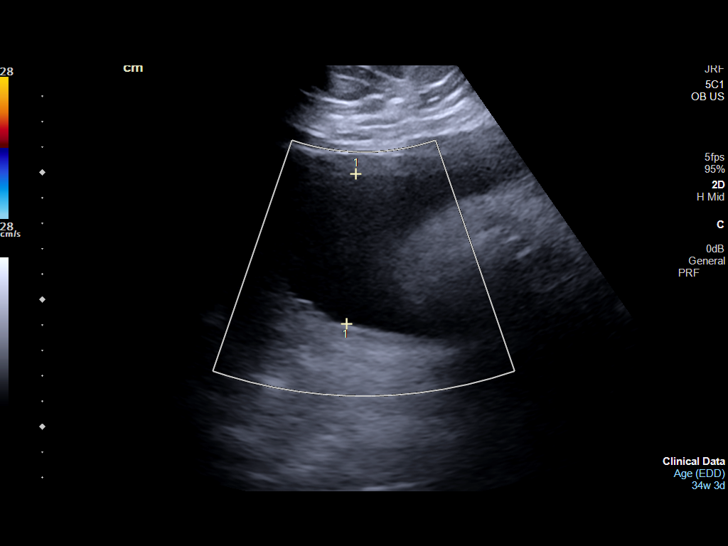
[im 13/18]
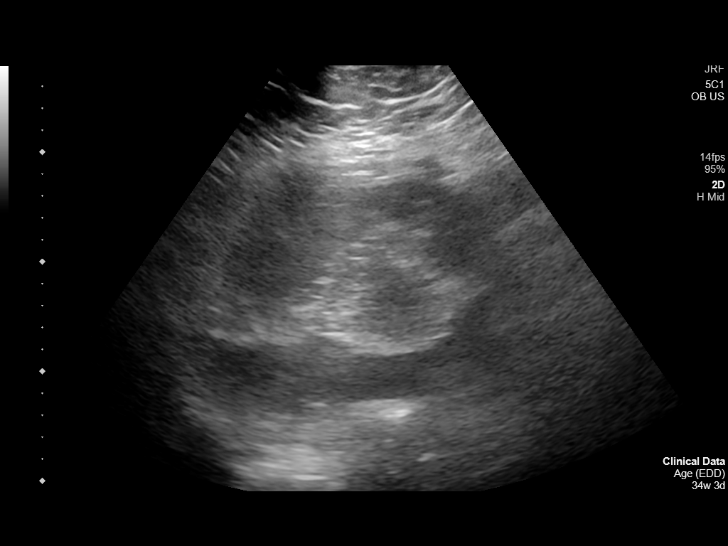
[im 14/18]
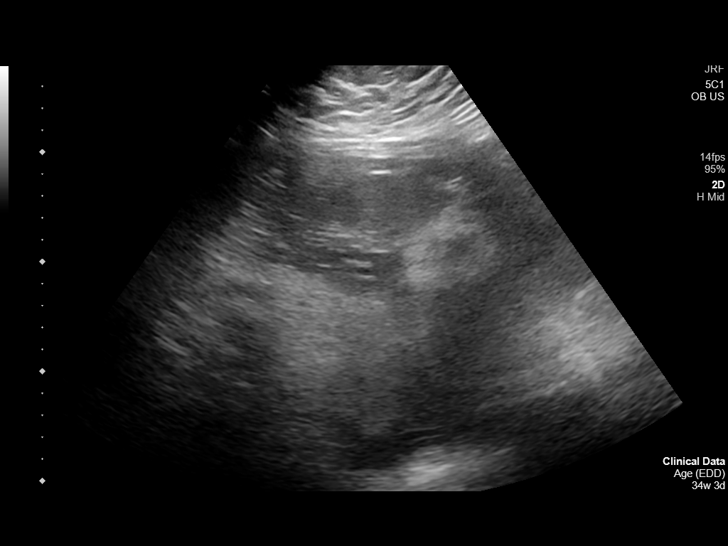
[im 15/18]
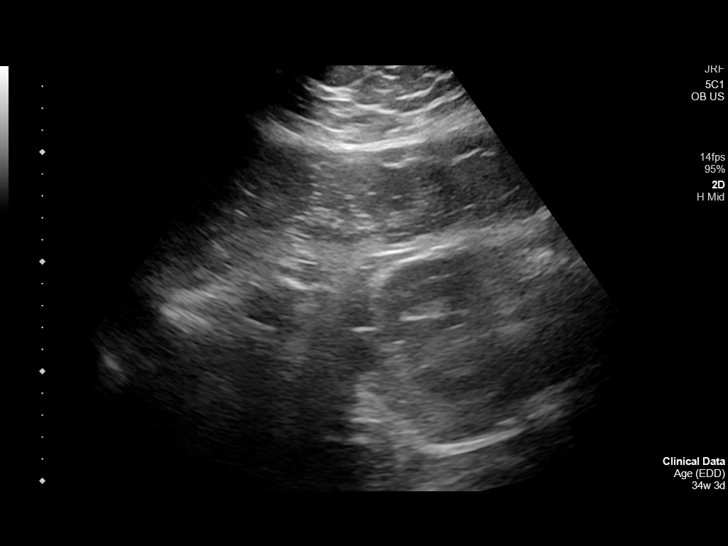
[im 17/18]
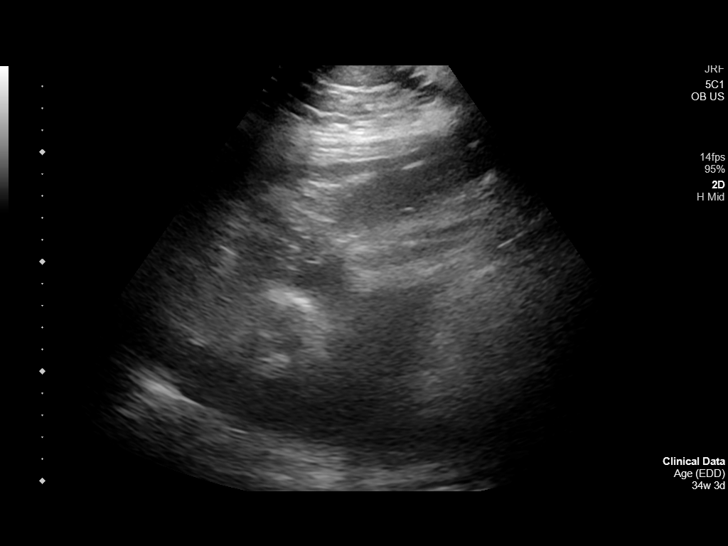
[im 18/18]
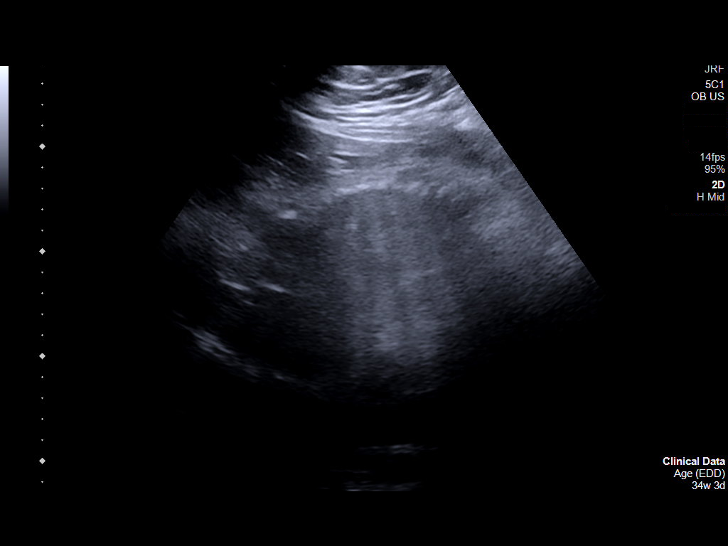

[14 of 18 positions shown; findings below may reference images not displayed]

FINDINGS: Number of Fetuses: 1

Heart Rate: 150 bpm

Presentation: Yes

Movement: 2 time: 10 minutes

Breathing: 2

Tone: 2

Amniotic Fluid: 2

Total Score: 8
IMPRESSION: Biophysical profile score of [DATE].

## 2024-04-10 ENCOUNTER — Ambulatory Visit: Payer: Self-pay

## 2024-04-10 VITALS — BP 124/58 | Ht 65.0 in | Wt 240.0 lb

## 2024-04-10 DIAGNOSIS — Z309 Encounter for contraceptive management, unspecified: Secondary | ICD-10-CM

## 2024-04-10 DIAGNOSIS — Z3201 Encounter for pregnancy test, result positive: Secondary | ICD-10-CM

## 2024-04-10 LAB — PREGNANCY, URINE: Preg Test, Ur: POSITIVE — AB

## 2024-04-10 MED ORDER — PRENATAL 27-0.8 MG PO TABS: 1.0000 | ORAL_TABLET | Freq: Every day | ORAL | Status: DC

## 2024-04-10 NOTE — Progress Notes (Signed)
 UPT positive. Plans to receive prenatal care at [facility]; encouraged patient to establish care ASAP.  The patient was dispensed prenatal vitamins #100 today. I provided counseling today regarding the medication. We discussed the medication, the side effects and when to call clinic.   Positive pregnancy packet reviewed and given to patient. Also counseled on hydration and when to seek medical attention.   Patient given the opportunity to ask questions. Questions answered.   Clare Critchley, RN

## 2024-04-20 ENCOUNTER — Other Ambulatory Visit: Payer: Self-pay

## 2024-04-20 ENCOUNTER — Emergency Department
Admission: EM | Admit: 2024-04-20 | Discharge: 2024-04-20 | Disposition: A | Discharge status: Home / Self Care | Payer: Self-pay | Attending: Emergency Medicine | Admitting: Emergency Medicine

## 2024-04-20 ENCOUNTER — Encounter: Payer: Self-pay | Admitting: Medical Oncology

## 2024-04-20 ENCOUNTER — Emergency Department: Payer: Self-pay

## 2024-04-20 DIAGNOSIS — O209 Hemorrhage in early pregnancy, unspecified: Secondary | ICD-10-CM | POA: Insufficient documentation

## 2024-04-20 DIAGNOSIS — O4690 Antepartum hemorrhage, unspecified, unspecified trimester: Secondary | ICD-10-CM

## 2024-04-20 DIAGNOSIS — Z3A01 Less than 8 weeks gestation of pregnancy: Secondary | ICD-10-CM | POA: Insufficient documentation

## 2024-04-20 LAB — BASIC METABOLIC PANEL WITH GFR
Anion gap: 10 (ref 5–15)
BUN: 12 mg/dL (ref 6–20)
CO2: 25 mmol/L (ref 22–32)
Calcium: 9.5 mg/dL (ref 8.9–10.3)
Chloride: 98 mmol/L (ref 98–111)
Creatinine, Ser: 0.52 mg/dL (ref 0.44–1.00)
GFR, Estimated: >60 mL/min (ref >60)
Glucose, Bld: 384 mg/dL — ABNORMAL HIGH (ref 70–99)
Potassium: 4.2 mmol/L (ref 3.5–5.1)
Sodium: 133 mmol/L — ABNORMAL LOW (ref 135–145)

## 2024-04-20 LAB — CBC
HCT: 42.2 % (ref 36.0–46.0)
Hemoglobin: 13.5 g/dL (ref 12.0–15.0)
MCH: 25.8 pg — ABNORMAL LOW (ref 26.0–34.0)
MCHC: 32 g/dL (ref 30.0–36.0)
MCV: 80.7 fL (ref 80.0–100.0)
Platelets: 305 10*3/uL (ref 150–400)
RBC: 5.23 MIL/uL — ABNORMAL HIGH (ref 3.87–5.11)
RDW: 14 % (ref 11.5–15.5)
WBC: 6.4 10*3/uL (ref 4.0–10.5)
nRBC: 0 % (ref 0.0–0.2)

## 2024-04-20 LAB — ABO/RH: ABO/RH(D): A POS

## 2024-04-20 LAB — HCG, QUANTITATIVE, PREGNANCY: hCG, Beta Chain, Quant, S: 2899 m[IU]/mL — ABNORMAL HIGH (ref <5)

## 2024-04-20 NOTE — ED Triage Notes (Signed)
 Pt reports that she is approx 6 weeks preg with LMP of 2/27. Pt reports this am she began having lower abd cramping and heavy vaginal bleeding.

## 2024-04-20 NOTE — ED Provider Notes (Signed)
 [facility] Provider Note    Event Date/Time   First MD Initiated Contact with Patient 04/20/24 1102     (approximate)   History   Vaginal Bleeding   HPI  Robin Houston is a 28 y.o. female G3, P1 presents emergency department with vaginal bleeding.  Had positive pregnancy test and was told she is about [redacted] weeks pregnant.  Patient states that they had sexual activity last night.  However the patient had had some spotting earlier in the week and now has blood clots and heavier bleeding today.  Mild cramping but no severe abdominal pain.  Is not bleeding more than 1 pad per hour      Physical Exam   Triage Vital Signs: ED Triage Vitals [04/20/24 1007]  Encounter Vitals Group     BP 124/88     Systolic BP Percentile      Diastolic BP Percentile      Pulse Rate 94     Resp 17     Temp 98 F (36.7 C)     Temp Source Oral     SpO2 100 %     Weight 240 lb (108.9 kg)     Height 5\' 5"  (1.651 m)     Head Circumference      Peak Flow      Pain Score 0     Pain Loc      Pain Education      Exclude from Growth Chart     Most recent vital signs: Vitals:   04/20/24 1007  BP: 124/88  Pulse: 94  Resp: 17  Temp: 98 F (36.7 C)  SpO2: 100%     General: Awake, no distress.   CV:  Good peripheral perfusion. regular rate and  rhythm Resp:  Normal effort. Lungs cta Abd:  No distention.  Nontender Other:      ED Results / Procedures / Treatments   Labs (all labs ordered are listed, but only abnormal results are displayed) Labs Reviewed  CBC - Abnormal; Notable for the following components:      Result Value   RBC 5.23 (*)    MCH 25.8 (*)    All other components within normal limits  BASIC METABOLIC PANEL WITH GFR - Abnormal; Notable for the following components:   Sodium 133 (*)    Glucose, Bld 384 (*)    All other components within normal limits  HCG, QUANTITATIVE, PREGNANCY - Abnormal; Notable for the following components:   hCG,  Beta Chain, Quant, S 2,899 (*)    All other components within normal limits  ABO/RH     EKG     RADIOLOGY Ultrasound OB less than 14 weeks    PROCEDURES:   Procedures  Critical Care: None Chief Complaint  Patient presents with   Vaginal Bleeding      MEDICATIONS ORDERED IN ED: Medications - No data to display   IMPRESSION / MDM / ASSESSMENT AND PLAN / ED COURSE  I reviewed the triage vital signs and the nursing notes.                              Differential diagnosis includes, but is not limited to, ectopic, threatened miscarriage, miscarriage, vaginal bleeding pregnancy  Patient's presentation is most consistent with acute illness / injury with system symptoms.   Labs are reassuring, patient's hemoglobin hematocrit are normal so do not feel she is hemorrhaging,  beta-hCG is elevated at 2899 Patient is also a positive so she will not need RhoGAM  Ultrasound OB less than 14 weeks ordered  I did independently review the ultrasound OB less than 14 weeks and feel there is a gestational sac, however radiologist is commenting that it is a vague gestational sac no yolk sac and is concerned that if her symptoms do not improve to consider ectopic pregnancy.  She should have close follow-up.  I did message [facility] for them to schedule her early next week for recheck.  Patient is to call their office today to make the appointment for recheck on her beta-hCG.  If it is decreasing then I would consider that she is miscarrying, increasing she may need to be evaluated for ectopic versus developing IUP.  Patient is in agreement with this treatment plan.  She was discharged stable condition.  Strict instructions to return if worsening      FINAL CLINICAL IMPRESSION(S) / ED DIAGNOSES   Final diagnoses:  Vaginal bleeding in pregnancy     Rx / DC Orders   ED Discharge Orders     None        Note:  This document was prepared using Dragon voice recognition  software and may include unintentional dictation errors.    Delsie Figures, PA-C 04/20/24 1640    Kandee Orion, MD 04/20/24 [phone]

## 2024-04-20 NOTE — Discharge Instructions (Signed)
 Follow-up with [facility] next week, preferably on Monday for repeat beta-hCG Return to emergency department if you are bleeding through more than 1 pad per hour or have severe abdominal pain. I did send the nursing midwife from [facility] a message stating that you would need to follow-up next week.

## 2024-04-23 ENCOUNTER — Other Ambulatory Visit: Payer: Self-pay

## 2024-04-23 DIAGNOSIS — O209 Hemorrhage in early pregnancy, unspecified: Secondary | ICD-10-CM

## 2024-04-24 ENCOUNTER — Telehealth: Payer: Self-pay

## 2024-04-24 LAB — BETA HCG QUANT (REF LAB): hCG Quant: 117 m[IU]/mL

## 2024-04-24 NOTE — Telephone Encounter (Signed)
 Patient called requesting results from her Beta HCG that was done yesterday. Her levels have dropped and indicate possible miscarriage. I explained to patient that she was having a miscarriage and needs to be seen by an MD. Call transferred to front desk to schedule appointment. Patient verbalized understanding.

## 2024-04-26 ENCOUNTER — Encounter: Payer: Self-pay | Admitting: Certified Nurse Midwife

## 2024-04-26 ENCOUNTER — Ambulatory Visit (INDEPENDENT_AMBULATORY_CARE_PROVIDER_SITE_OTHER): Payer: Self-pay | Admitting: Certified Nurse Midwife

## 2024-04-26 VITALS — BP 115/74 | HR 84 | Wt 245.0 lb

## 2024-04-26 DIAGNOSIS — O039 Complete or unspecified spontaneous abortion without complication: Secondary | ICD-10-CM

## 2024-04-26 DIAGNOSIS — N96 Recurrent pregnancy loss: Secondary | ICD-10-CM

## 2024-04-26 NOTE — Progress Notes (Signed)
 Patient, No Pcp Per   Chief Complaint  Patient presents with   Hosptial Follow Up    HPI:      Mari Jamar Casagrande is a 28 y.o. [phone] whose LMP was Patient's last menstrual period was 02/02/2024., presents today for follow up after ED visit for miscarriage. Bleeding has stopped. Denies pain or signs of infection. Accompanied by husband. They are open to conception and this pregnancy was welcomed. She has a history of 2 miscarriages prior to the full term birth of her son, pregnancy complicated by pre-eclampsia with severe features, type 2 DM requiring insulin . Reports regular menstrual cycles.  Continues on metformin  for diabetes.    Patient Active Problem List   Diagnosis Date Noted   Encounter for care or examination of lactating mother 11/06/2021   Postpartum care following cesarean delivery 11/06/2021   Preeclampsia, severe, third trimester 11/04/2021   [redacted] weeks gestation of pregnancy 11/04/2021   Supervision of high-risk pregnancy, third trimester 11/02/2021   NST (non-stress test) nonreactive 10/15/2021   Obesity affecting pregnancy in second trimester 04/15/2021   Pregnancy complicated by pre-existing type 2 diabetes 04/15/2021   Supervision of high-risk pregnancy 04/08/2021   Symptomatic anemia 03/31/2020    Past Surgical History:  Procedure Laterality Date   CESAREAN SECTION  11/04/2021   Procedure: CESAREAN SECTION;  Surgeon: Kris Pester, MD;  Location: [facility] ORS;  Service: Obstetrics;;   no surgical history      Family History  Problem Relation Age of Onset   Thyroid disease Mother    Diabetes Father    Diabetes Paternal Grandmother     Social History   Socioeconomic History   Marital status: Single    Spouse name: Not on file   Number of children: 1   Years of education: Not on file   Highest education level: Not on file  Occupational History   Not on file  Tobacco Use   Smoking status: Never   Smokeless tobacco: Never  Vaping Use    Vaping status: Never Used  Substance and Sexual Activity   Alcohol use: Never   Drug use: Never   Sexual activity: Yes    Birth control/protection: Pill  Other Topics Concern   Not on file  Social History Narrative   Not on file   Social Drivers of Health   Financial Resource Strain: Not on file  Food Insecurity: Not on file  Transportation Needs: Not on file  Physical Activity: Not on file  Stress: Not on file  Social Connections: Not on file  Intimate Partner Violence: Not At Risk (04/10/2024)   Humiliation, Afraid, Rape, and Kick questionnaire    Fear of Current or Ex-Partner: No    Emotionally Abused: No    Physically Abused: No    Sexually Abused: No    Outpatient Medications Prior to Visit  Medication Sig Dispense Refill   ferrous sulfate  325 (65 FE) MG EC tablet Take 325 mg by mouth 3 (three) times daily with meals.     metFORMIN  (GLUCOPHAGE ) 1000 MG tablet TAKE 1 TABLET (1,000 MG TOTAL) BY MOUTH 2 (TWO) TIMES DAILY WITH A MEAL. 180 tablet 3   enoxaparin  (LOVENOX ) 80 MG/0.8ML injection Inject 0.8 mLs (80 mg total) into the skin daily. (Patient not taking: Reported on 04/26/2024) 30 mL 1   norethindrone  (MICRONOR ) 0.35 MG tablet Take 1 tablet (0.35 mg total) by mouth daily. (Patient not taking: Reported on 04/26/2024) 28 tablet 11   Prenatal Vit-Fe Fumarate-FA (MULTIVITAMIN-PRENATAL)  27-0.8 MG TABS tablet Take 1 tablet by mouth daily at 12 noon. (Patient not taking: Reported on 04/26/2024)     Prenatal Vit-Fe Fumarate-FA (MULTIVITAMIN-PRENATAL) 27-0.8 MG TABS tablet Take 1 tablet by mouth daily at 12 noon. (Patient not taking: Reported on 04/26/2024)     No facility-administered medications prior to visit.      ROS:  Review of Systems  Constitutional: Negative.   Respiratory: Negative.    Cardiovascular: Negative.   Genitourinary:  Negative for vaginal bleeding.     OBJECTIVE:   Vitals:  BP 115/74   Pulse 84   Wt 245 lb (111.1 kg)   LMP 02/02/2024   BMI 40.77  kg/m   Physical Exam Constitutional:      General: She is not in acute distress.    Appearance: Normal appearance. She is obese.  Cardiovascular:     Rate and Rhythm: Normal rate.  Pulmonary:     Effort: Pulmonary effort is normal.  Neurological:     General: No focal deficit present.     Mental Status: She is alert and oriented to person, place, and time.  Psychiatric:        Mood and Affect: Mood normal.        Behavior: Behavior normal.     Comments: Appropriately tearful discussing pregnancy loss.     Results: No results found for this or any previous visit (from the past 24 hours).   Assessment/Plan: Miscarriage - Plan: Human Chorionic Gonadotropin  (hCG),Quantitative (Serial Monitor)  Follow Hcg until <5, repeat in 2w. Reviewed we do not have an explanation for most early pregnancy loss. Briefly discussed testing to recurrent pregnancy loss given this is her third SAB, information sent via MyChart. Recommend continuing treatment for diabetes to reduce risks in future pregnancy. Declines hormonal contraception, aware of options.   No orders of the defined types were placed in this encounter.    Forestine Igo, CNM 04/26/2024 10:29 AM

## 2024-04-27 ENCOUNTER — Ambulatory Visit: Payer: Self-pay | Admitting: Certified Nurse Midwife

## 2024-05-04 ENCOUNTER — Telehealth: Payer: Self-pay

## 2024-05-09 ENCOUNTER — Other Ambulatory Visit: Payer: Self-pay

## 2024-05-09 ENCOUNTER — Ambulatory Visit: Payer: Self-pay | Admitting: Certified Nurse Midwife

## 2024-05-09 DIAGNOSIS — O039 Complete or unspecified spontaneous abortion without complication: Secondary | ICD-10-CM

## 2024-05-10 LAB — HUMAN CHORIONIC GONADOTROPIN(HCG),B-SUBUNIT,QUANTITATIVE): HCG, Beta Chain, Quant, S: <1 m[IU]/mL

## 2024-05-12 ENCOUNTER — Ambulatory Visit: Payer: Self-pay | Admitting: Certified Nurse Midwife

## 2024-05-23 ENCOUNTER — Encounter: Admitting: Certified Nurse Midwife

## 2024-07-03 ENCOUNTER — Ambulatory Visit (INDEPENDENT_AMBULATORY_CARE_PROVIDER_SITE_OTHER): Payer: Self-pay

## 2024-07-03 VITALS — BP 129/87 | HR 106 | Ht 65.0 in | Wt 264.1 lb

## 2024-07-03 DIAGNOSIS — N912 Amenorrhea, unspecified: Secondary | ICD-10-CM

## 2024-07-03 DIAGNOSIS — Z3201 Encounter for pregnancy test, result positive: Secondary | ICD-10-CM | POA: Diagnosis not present

## 2024-07-03 DIAGNOSIS — Z3200 Encounter for pregnancy test, result unknown: Secondary | ICD-10-CM

## 2024-07-03 LAB — POCT URINE PREGNANCY: Preg Test, Ur: POSITIVE — AB

## 2024-07-03 NOTE — Patient Instructions (Signed)
 First Trimester of Pregnancy  The first trimester of pregnancy starts on the first day of your last monthly period until the end of week 13. This is months 1 through 3 of pregnancy. A week after a sperm fertilizes an egg, the egg will implant into the wall of the uterus and begin to develop into a baby. Body changes during your first trimester Your body goes through many changes during pregnancy. The changes usually return to normal after your baby is born. Physical changes Your breasts may grow larger and may hurt. The area around your nipples may get darker. Your periods will stop. Your hair and nails may grow faster. You may pee more often. Health changes You may tire easily. Your gums may bleed and may be sensitive when you brush and floss. You may not feel hungry. You may have heartburn. You may throw up or feel like you may throw up. You may want to eat some foods, but not others. You may have headaches. You may have trouble pooping (constipation). Other changes Your emotions may change from day to day. You may have more dreams. Follow these instructions at home: Medicines Talk to your health care provider if you're taking medicines. Ask if the medicines are safe to take during pregnancy. Your provider may change the medicines that you take. Do not take any medicines unless told to by your provider. Take a prenatal vitamin that has at least 600 micrograms (mcg) of folic acid. Do not use herbal medicines, illegal substances, or medicines that are not approved by your provider. Eating and drinking While you're pregnant your body needs extra food for your growing baby. Talk with your provider about what to eat while pregnant. Activity Most women are able to exercise during pregnancy. Exercises may need to change as your pregnancy goes on. Talk to your provider about your activities and exercise routines. Relieving pain and discomfort Wear a good, supportive bra if your breasts  hurt. Rest with your legs raised if you have leg cramps or low back pain. Safety Wear your seatbelt at all times when you're in a car. Talk to your provider if someone hits you, hurts you, or yells at you. Talk with your provider if you're feeling sad or have thoughts of hurting yourself. Lifestyle Certain things can be harmful while you're pregnant. Follow these rules: Do not use hot tubs, steam rooms, or saunas. Do not douche. Do not use tampons or scented pads. Do not drink alcohol,smoke, vape, or use products with nicotine or tobacco in them. If you need help quitting, talk with your provider. Avoid cat litter boxes and soil used by cats. These things carry germs that can cause harm to your pregnancy and your baby. General instructions Keep all follow-up visits. It helps you and your unborn baby stay as healthy as possible. Write down your questions. Take them to your visits. Your provider will: Talk with you about your overall health. Give you advice or refer you to specialists who can help with different needs, including: Prenatal education classes. Mental health and counseling. Foods and healthy eating. Ask for help if you need help with food. Call your dentist and ask to be seen. Brush your teeth with a soft toothbrush. Floss gently. Where to find more information American Pregnancy Association: americanpregnancy.org [employer]: acog.org Office on [employer] Health: [employer] Contact a health care provider if: You feel dizzy, faint, or have a fever. You vomit or have watery poop (diarrhea) for 2  days or more. You have abnormal discharge or bleeding from your vagina. You have pain when you pee or your pee smells bad. You have cramps, pain, or pressure in your belly area. Get help right away if: You have trouble breathing or chest pain. You have any kind of injury, such as from a fall or a car crash. These symptoms may be an  emergency. Get help right away. Call 911. Do not wait to see if the symptoms will go away. Do not drive yourself to the hospital. This information is not intended to replace advice given to you by your health care provider. Make sure you discuss any questions you have with your health care provider. Document Revised: 08/25/2023 Document Reviewed: 03/25/2023 Elsevier Patient Education  2024 Elsevier Inc.  Morning Sickness Morning sickness is when you throw up or feel like you may throw up during pregnancy. This condition often occurs in the morning, but it can also occur at any time of day. Morning sickness is most common during the first three months of pregnancy, but it can go on throughout the pregnancy. Morning sickness is usually harmless. But if you throw up all the time, you should see your health care provider. You may also hear this condition called nausea and vomiting of pregnancy. What are the causes? The cause of morning sickness is not known. It may be linked to changes in hormones during pregnancy. What increases the risk? You're more likely to have morning sickness if: You had morning sickness in another pregnancy. You're pregnant with more than one baby, such as twins. You had morning sickness in other pregnancies. You have had motion sickness before you were pregnant. You have had bad headaches or migraines before you were pregnant. What are the signs or symptoms? Symptoms of morning sickness include: Feeling like you may throw up. Throwing up. How is this diagnosed? Morning sickness is diagnosed based on your symptoms. How is this treated? Treatment is usually not needed for morning sickness. You may only need to change what you eat. In some cases, your provider may give you: Vitamin B6 supplements. Medicines to prevent throwing up. Ginger. Follow these instructions at home: Medicines Take your medicines only as told by your provider. Do not use any prescription,  over-the-counter, or herbal medicines for morning sickness without first talking with your provider. Take prenatal vitamins. These can stop or lessen the symptoms of morning sickness. If you feel like you may throw up after taking prenatal vitamins, take them at night or with a snack. Eating and drinking     Eat dry toast or crackers before getting out of bed. Eat 5 or 6 small meals a day. Try ginger ale made with real ginger, ginger tea, or ginger candies. Drink fluids throughout the day. Eat protein foods when you need a snack. Nuts, yogurt, and cheese are good choices. Eat dry and bland foods like rice or baked potatoes. Foods that are high in carbohydrates are often helpful. Have someone cook for you if the smell of food makes you want to throw up. Foods to avoid Greasy foods. Fatty foods. Spicy foods. General instructions Try to avoid smells that make you feel sick. Use an air purifier to keep the air in your house free of smells. Try using an acupressure wristband. This is a wristband that's used to treat motion sickness. Try acupuncture. In this treatment, a provider puts thin needles into certain areas of your body to make you feel better. Brush your teeth  after throwing up or rinse with a mix of baking soda and water. The acid in throw-up can hurt your teeth. Contact a health care provider if: Your symptoms do not get better. You feel dizzy or light-headed. You're losing weight. Get help right away if: The feeling that you may throw up will not go away, or you can't stop throwing up. You faint. You have very bad pain in your belly. This information is not intended to replace advice given to you by your health care provider. Make sure you discuss any questions you have with your health care provider. Document Revised: 08/25/2023 Document Reviewed: 03/03/2023 Elsevier Patient Education  2024 Elsevier Inc.  Commonly Asked Questions During Pregnancy  Cats: A parasite can  be excreted in cat feces.  To avoid exposure you need to have another person empty the little box.  If you must empty the litter box you will need to wear gloves.  Wash your hands after handling your cat.  This parasite can also be found in raw or undercooked meat so this should also be avoided.  Colds, Sore Throats, Flu: Please check your medication sheet to see what you can take for symptoms.  If your symptoms are unrelieved by these medications please call the office.  Dental Work: Most any dental work Agricultural consultant recommends is permitted.  X-rays should only be taken during the first trimester if absolutely necessary.  Your abdomen should be shielded with a lead apron during all x-rays.  Please notify your provider prior to receiving any x-rays.  Novocaine is fine; gas is not recommended.  If your dentist requires a note from [place] prior to dental work please call the office and we will provide one for you.  Exercise: Exercise is an important part of staying healthy during your pregnancy.  You may continue most exercises you were accustomed to prior to pregnancy.  Later in your pregnancy you will most likely notice you have difficulty with activities requiring balance like riding a bicycle.  It is important that you listen to your body and avoid activities that put you at a higher risk of falling.  Adequate rest and staying well hydrated are a must!  If you have questions about the safety of specific activities ask your provider.    Exposure to Children with illness: Try to avoid obvious exposure; report any symptoms to [place] when noted,  If you have chicken pos, red measles or mumps, you should be immune to these diseases.   Please do not take any vaccines while pregnant unless you have checked with your OB provider.  Fetal Movement: After 28 weeks we recommend you do "kick counts" twice daily.  Lie or sit down in a calm quiet environment and count your baby movements "kicks".  You should feel your baby at  least 10 times per hour.  If you have not felt 10 kicks within the first hour get up, walk around and have something sweet to eat or drink then repeat for an additional hour.  If count remains less than 10 per hour notify your provider.  Fumigating: Follow your pest control agent's advice as to how long to stay out of your home.  Ventilate the area well before re-entering.  Hemorrhoids:   Most over-the-counter preparations can be used during pregnancy.  Check your medication to see what is safe to use.  It is important to use a stool softener or fiber in your diet and to drink lots of liquids.  If  hemorrhoids seem to be getting worse please call the office.   Hot Tubs:  Hot tubs Jacuzzis and saunas are not recommended while pregnant.  These increase your internal body temperature and should be avoided.  Intercourse:  Sexual intercourse is safe during pregnancy as long as you are comfortable, unless otherwise advised by your provider.  Spotting may occur after intercourse; report any bright red bleeding that is heavier than spotting.  Labor:  If you know that you are in labor, please go to the hospital.  If you are unsure, please call the office and let us help you decide what to do.  Lifting, straining, etc:  If your job requires heavy lifting or straining please check with your provider for any limitations.  Generally, you should not lift items heavier than that you can lift simply with your hands and arms (no back muscles)  Painting:  Paint fumes do not harm your pregnancy, but may make you ill and should be avoided if possible.  Latex or water based paints have less odor than oils.  Use adequate ventilation while painting.  Permanents & Hair Color:  Chemicals in hair dyes are not recommended as they cause increase hair dryness which can increase hair loss during pregnancy.  " Highlighting" and permanents are allowed.  Dye may be absorbed differently and permanents may not hold as well during  pregnancy.  Sunbathing:  Use a sunscreen, as skin burns easily during pregnancy.  Drink plenty of fluids; avoid over heating.  Tanning Beds:  Because their possible side effects are still unknown, tanning beds are not recommended.  Ultrasound Scans:  Routine ultrasounds are performed at approximately 20 weeks.  You will be able to see your baby's general anatomy an if you would like to know the gender this can usually be determined as well.  If it is questionable when you conceived you may also receive an ultrasound early in your pregnancy for dating purposes.  Otherwise ultrasound exams are not routinely performed unless there is a medical necessity.  Although you can request a scan we ask that you pay for it when conducted because insurance does not cover " patient request" scans.  Work: If your pregnancy proceeds without complications you may work until your due date, unless your physician or employer advises otherwise.  Round Ligament Pain/Pelvic Discomfort:  Sharp, shooting pains not associated with bleeding are fairly common, usually occurring in the second trimester of pregnancy.  They tend to be worse when standing up or when you remain standing for long periods of time.  These are the result of pressure of certain pelvic ligaments called "round ligaments".  Rest, Tylenol and heat seem to be the most effective relief.  As the womb and fetus grow, they rise out of the pelvis and the discomfort improves.  Please notify the office if your pain seems different than that described.  It may represent a more serious condition.   Common Medications Safe in Pregnancy  Acne:      Constipation:  Benzoyl Peroxide     Colace  Clindamycin      Dulcolax Suppository  Topica Erythromycin     Fibercon  Salicylic Acid      Metamucil         Miralax AVOID:        Senakot   Accutane    Cough:  Retin-A       Cough Drops  Tetracycline      Phenergan w/ Codeine if Rx  Minocycline  Robitussin (Plain &  DM)  Antibiotics:     Crabs/Lice:  Ceclor       RID  Cephalosporins    AVOID:  E-Mycins      Kwell  Keflex  Macrobid/Macrodantin   Diarrhea:  Penicillin      Kao-Pectate  Zithromax      Imodium AD         PUSH FLUIDS AVOID:       Cipro     Fever:  Tetracycline      Tylenol (Regular or Extra  Minocycline       Strength)  Levaquin      Extra Strength-Do not          Exceed 8 tabs/24 hrs Caffeine:        200mg /day (equiv. To 1 cup of coffee or  approx. 3 12 oz sodas)         Gas: Cold/Hayfever:       Gas-X  Benadryl      Mylicon  Claritin       Phazyme  **Claritin-D        Chlor-Trimeton    Headaches:  Dimetapp      ASA-Free Excedrin  Drixoral-Non-Drowsy     Cold Compress  Mucinex (Guaifenasin)     Tylenol (Regular or Extra  Sudafed/Sudafed-12 Hour     Strength)  **Sudafed PE Pseudoephedrine   Tylenol Cold & Sinus     Vicks Vapor Rub  Zyrtec  **AVOID if Problems With Blood Pressure         Heartburn: Avoid lying down for at least 1 hour after meals  Aciphex      Maalox     Rash:  Milk of Magnesia     Benadryl    Mylanta       1% Hydrocortisone Cream  Pepcid  Pepcid Complete   Sleep Aids:  Prevacid      Ambien   Prilosec       Benadryl  Rolaids       Chamomile Tea  Tums (Limit 4/day)     Unisom         Tylenol PM         Warm milk-add vanilla or  Hemorrhoids:       Sugar for taste  Anusol/Anusol H.C.  (RX: Analapram 2.5%)  Sugar Substitutes:  Hydrocortisone OTC     Ok in moderation  Preparation H      Tucks        Vaseline lotion applied to tissue with wiping    Herpes:     Throat:  Acyclovir      Oragel  Famvir  Valtrex     Vaccines:         Flu Shot Leg Cramps:       *Gardasil  Benadryl      Hepatitis A         Hepatitis B Nasal Spray:       Pneumovax  Saline Nasal Spray     Polio Booster         Tetanus Nausea:       Tuberculosis test or PPD  Vitamin B6 25 mg TID   AVOID:    Dramamine      *Gardasil  Emetrol       Live Poliovirus  Ginger  Root 250 mg QID    MMR (measles, mumps &  High Complex Carbs @ Bedtime    rebella)  Sea Bands-Accupressure    Varicella (Chickenpox)  Unisom 1/2  tab TID     *No known complications           If received before Pain:         Known pregnancy;   Darvocet       Resume series after  Lortab        Delivery  Percocet    Yeast:   Tramadol      Femstat  Tylenol 3      Gyne-lotrimin  Ultram       Monistat  Vicodin           MISC:         All Sunscreens           Hair Coloring/highlights          Insect Repellant's          (Including DEET)         Mystic Tans

## 2024-07-03 NOTE — Progress Notes (Signed)
    NURSE VISIT NOTE  Subjective:    Patient ID: Robin Houston, female    DOB: 02/26/1996, 28 y.o.   MRN: [id number]  HPI  Patient is a 28 y.o. [id number] female who presents for evaluation of amenorrhea. She believes she could be pregnant. Pregnancy is desired. Sexual Activity: single partner, contraception: none. Current symptoms also include: breast tenderness, frequent urination, nausea, positive home pregnancy test, and heartburn. Last period was normal.    Objective:    BP 129/87   Pulse (!) 106   Ht 5' 5 (1.651 m)   Wt 264 lb 1.6 oz (119.8 kg)   LMP 05/23/2024 (Exact Date)   BMI 43.95 kg/m   Lab Review  Results for orders placed or performed in visit on 07/03/24  POCT urine pregnancy  Result Value Ref Range   Preg Test, Ur Positive (A) Negative    Assessment:   1. Possible pregnancy, not yet confirmed     Plan:   Pregnancy Test: Positive  Estimated Date of Delivery: 02/27/25 BP Cuff Measurement taken. Cuff Size Adult Large Encouraged well-balanced diet, plenty of rest when needed, pre-natal vitamins daily and walking for exercise.  Discussed self-help for nausea, avoiding OTC medications until consulting provider or pharmacist, other than Tylenol  as needed, minimal caffeine (1-2 cups daily) and avoiding alcohol.   She will schedule her nurse visit @ 7-[redacted] wks pregnant, u/s for dating @10  wk, and NOB visit at [redacted] wk pregnant.    Feel free to call with any questions.     Camelia Bars, LPN

## 2024-07-09 ENCOUNTER — Telehealth: Payer: Self-pay

## 2024-07-09 DIAGNOSIS — N96 Recurrent pregnancy loss: Secondary | ICD-10-CM

## 2024-07-09 NOTE — Telephone Encounter (Signed)
 Patient is scheduled for labs on 07/10/24

## 2024-07-09 NOTE — Telephone Encounter (Signed)
Unable to reach patient by phone.  My chart message sent.

## 2024-07-10 ENCOUNTER — Other Ambulatory Visit

## 2024-07-10 DIAGNOSIS — N96 Recurrent pregnancy loss: Secondary | ICD-10-CM

## 2024-07-11 LAB — BETA HCG QUANT (REF LAB): hCG Quant: 24583 m[IU]/mL

## 2024-07-12 ENCOUNTER — Other Ambulatory Visit

## 2024-07-12 DIAGNOSIS — N96 Recurrent pregnancy loss: Secondary | ICD-10-CM

## 2024-07-12 LAB — BETA HCG QUANT (REF LAB): hCG Quant: 29833 m[IU]/mL

## 2024-07-13 ENCOUNTER — Telehealth (INDEPENDENT_AMBULATORY_CARE_PROVIDER_SITE_OTHER): Payer: Self-pay

## 2024-07-13 DIAGNOSIS — O0990 Supervision of high risk pregnancy, unspecified, unspecified trimester: Secondary | ICD-10-CM | POA: Insufficient documentation

## 2024-07-13 DIAGNOSIS — Z3689 Encounter for other specified antenatal screening: Secondary | ICD-10-CM

## 2024-07-13 DIAGNOSIS — O09299 Supervision of pregnancy with other poor reproductive or obstetric history, unspecified trimester: Secondary | ICD-10-CM | POA: Insufficient documentation

## 2024-07-13 DIAGNOSIS — Z3480 Encounter for supervision of other normal pregnancy, unspecified trimester: Secondary | ICD-10-CM | POA: Insufficient documentation

## 2024-07-13 DIAGNOSIS — Z3687 Encounter for antenatal screening for uncertain dates: Secondary | ICD-10-CM

## 2024-07-13 NOTE — Patient Instructions (Signed)
 First Trimester of Pregnancy  The first trimester of pregnancy starts on the first day of your last monthly period until the end of week 13. This is months 1 through 3 of pregnancy. A week after a sperm fertilizes an egg, the egg will implant into the wall of the uterus and begin to develop into a baby. Body changes during your first trimester Your body goes through many changes during pregnancy. The changes usually return to normal after your baby is born. Physical changes Your breasts may grow larger and may hurt. The area around your nipples may get darker. Your periods will stop. Your hair and nails may grow faster. You may pee more often. Health changes You may tire easily. Your gums may bleed and may be sensitive when you brush and floss. You may not feel hungry. You may have heartburn. You may throw up or feel like you may throw up. You may want to eat some foods, but not others. You may have headaches. You may have trouble pooping (constipation). Other changes Your emotions may change from day to day. You may have more dreams. Follow these instructions at home: Medicines Talk to your health care provider if you're taking medicines. Ask if the medicines are safe to take during pregnancy. Your provider may change the medicines that you take. Do not take any medicines unless told to by your provider. Take a prenatal vitamin that has at least 600 micrograms (mcg) of folic acid. Do not use herbal medicines, illegal substances, or medicines that are not approved by your provider. Eating and drinking While you're pregnant your body needs extra food for your growing baby. Talk with your provider about what to eat while pregnant. Activity Most women are able to exercise during pregnancy. Exercises may need to change as your pregnancy goes on. Talk to your provider about your activities and exercise routines. Relieving pain and discomfort Wear a good, supportive bra if your breasts  hurt. Rest with your legs raised if you have leg cramps or low back pain. Safety Wear your seatbelt at all times when you're in a car. Talk to your provider if someone hits you, hurts you, or yells at you. Talk with your provider if you're feeling sad or have thoughts of hurting yourself. Lifestyle Certain things can be harmful while you're pregnant. Follow these rules: Do not use hot tubs, steam rooms, or saunas. Do not douche. Do not use tampons or scented pads. Do not drink alcohol,smoke, vape, or use products with nicotine or tobacco in them. If you need help quitting, talk with your provider. Avoid cat litter boxes and soil used by cats. These things carry germs that can cause harm to your pregnancy and your baby. General instructions Keep all follow-up visits. It helps you and your unborn baby stay as healthy as possible. Write down your questions. Take them to your visits. Your provider will: Talk with you about your overall health. Give you advice or refer you to specialists who can help with different needs, including: Prenatal education classes. Mental health and counseling. Foods and healthy eating. Ask for help if you need help with food. Call your dentist and ask to be seen. Brush your teeth with a soft toothbrush. Floss gently. Where to find more information American Pregnancy Association: americanpregnancy.org [employer]: acog.org Office on [employer] Health: [employer] Contact a health care provider if: You feel dizzy, faint, or have a fever. You vomit or have watery poop (diarrhea) for 2  days or more. You have abnormal discharge or bleeding from your vagina. You have pain when you pee or your pee smells bad. You have cramps, pain, or pressure in your belly area. Get help right away if: You have trouble breathing or chest pain. You have any kind of injury, such as from a fall or a car crash. These symptoms may be an  emergency. Get help right away. Call 911. Do not wait to see if the symptoms will go away. Do not drive yourself to the hospital. This information is not intended to replace advice given to you by your health care provider. Make sure you discuss any questions you have with your health care provider. Document Revised: 08/25/2023 Document Reviewed: 03/25/2023 Elsevier Patient Education  2024 Elsevier Inc.   Common Medications Safe in Pregnancy  Acne:      Constipation:  Benzoyl Peroxide     Colace  Clindamycin      Dulcolax Suppository  Topica Erythromycin     Fibercon  Salicylic Acid      Metamucil         Miralax AVOID:        Senakot   Accutane    Cough:  Retin-A       Cough Drops  Tetracycline      Phenergan w/ Codeine if Rx  Minocycline      Robitussin (Plain & DM)  Antibiotics:     Crabs/Lice:  Ceclor       RID  Cephalosporins    AVOID:  E-Mycins      Kwell  Keflex  Macrobid/Macrodantin   Diarrhea:  Penicillin      Kao-Pectate  Zithromax      Imodium AD         PUSH FLUIDS AVOID:       Cipro     Fever:  Tetracycline      Tylenol (Regular or Extra  Minocycline       Strength)  Levaquin      Extra Strength-Do not          Exceed 8 tabs/24 hrs Caffeine:        200mg /day (equiv. To 1 cup of coffee or  approx. 3 12 oz sodas)         Gas: Cold/Hayfever:       Gas-X  Benadryl      Mylicon  Claritin       Phazyme  **Claritin-D        Chlor-Trimeton    Headaches:  Dimetapp      ASA-Free Excedrin  Drixoral-Non-Drowsy     Cold Compress  Mucinex (Guaifenasin)     Tylenol (Regular or Extra  Sudafed/Sudafed-12 Hour     Strength)  **Sudafed PE Pseudoephedrine   Tylenol Cold & Sinus     Vicks Vapor Rub  Zyrtec  **AVOID if Problems With Blood Pressure         Heartburn: Avoid lying down for at least 1 hour after meals  Aciphex      Maalox     Rash:  Milk of Magnesia     Benadryl    Mylanta       1% Hydrocortisone Cream  Pepcid  Pepcid Complete   Sleep  Aids:  Prevacid      Ambien   Prilosec       Benadryl  Rolaids       Chamomile Tea  Tums (Limit 4/day)     Unisom  Tylenol PM         Warm milk-add vanilla or  Hemorrhoids:       Sugar for taste  Anusol/Anusol H.C.  (RX: Analapram 2.5%)  Sugar Substitutes:  Hydrocortisone OTC     Ok in moderation  Preparation H      Tucks        Vaseline lotion applied to tissue with wiping    Herpes:     Throat:  Acyclovir      Oragel  Famvir  Valtrex     Vaccines:         Flu Shot Leg Cramps:       *Gardasil  Benadryl      Hepatitis A         Hepatitis B Nasal Spray:       Pneumovax  Saline Nasal Spray     Polio Booster         Tetanus Nausea:       Tuberculosis test or PPD  Vitamin B6 25 mg TID   AVOID:    Dramamine      *Gardasil  Emetrol       Live Poliovirus  Ginger Root 250 mg QID    MMR (measles, mumps &  High Complex Carbs @ Bedtime    rebella)  Sea Bands-Accupressure    Varicella (Chickenpox)  Unisom 1/2 tab TID     *No known complications           If received before Pain:         Known pregnancy;   Darvocet       Resume series after  Lortab        Delivery  Percocet    Yeast:   Tramadol      Femstat  Tylenol 3      Gyne-lotrimin  Ultram       Monistat  Vicodin           MISC:         All Sunscreens           Hair Coloring/highlights          Insect Repellant's          (Including DEET)         Mystic Tans   Commonly Asked Questions During Pregnancy   Cats: A parasite can be excreted in cat feces.  To avoid exposure you need to have another person empty the little box.  If you must empty the litter box you will need to wear gloves.  Wash your hands after handling your cat.  This parasite can also be found in raw or undercooked meat so this should also be avoided.  Colds, Sore Throats, Flu: Please check your medication sheet to see what you can take for symptoms.  If your symptoms are unrelieved by these medications please call the office.  Dental Work: Most  any dental work Agricultural consultant recommends is permitted.  X-rays should only be taken during the first trimester if absolutely necessary.  Your abdomen should be shielded with a lead apron during all x-rays.  Please notify your provider prior to receiving any x-rays.  Novocaine is fine; gas is not recommended.  If your dentist requires a note from [place] prior to dental work please call the office and we will provide one for you.  Exercise: Exercise is an important part of staying healthy during your pregnancy.  You may continue most exercises you were accustomed to prior to pregnancy.  Later in your pregnancy you will most likely notice you have difficulty with activities requiring balance like riding a bicycle.  It is important that you listen to your body and avoid activities that put you at a higher risk of falling.  Adequate rest and staying well hydrated are a must!  If you have questions about the safety of specific activities ask your provider.    Exposure to Children with illness: Try to avoid obvious exposure; report any symptoms to [place] when noted,  If you have chicken pos, red measles or mumps, you should be immune to these diseases.   Please do not take any vaccines while pregnant unless you have checked with your OB provider.  Fetal Movement: After 28 weeks we recommend you do "kick counts" twice daily.  Lie or sit down in a calm quiet environment and count your baby movements "kicks".  You should feel your baby at least 10 times per hour.  If you have not felt 10 kicks within the first hour get up, walk around and have something sweet to eat or drink then repeat for an additional hour.  If count remains less than 10 per hour notify your provider.  Fumigating: Follow your pest control agent's advice as to how long to stay out of your home.  Ventilate the area well before re-entering.  Hemorrhoids:   Most over-the-counter preparations can be used during pregnancy.  Check your medication to see what is  safe to use.  It is important to use a stool softener or fiber in your diet and to drink lots of liquids.  If hemorrhoids seem to be getting worse please call the office.   Hot Tubs:  Hot tubs Jacuzzis and saunas are not recommended while pregnant.  These increase your internal body temperature and should be avoided.  Intercourse:  Sexual intercourse is safe during pregnancy as long as you are comfortable, unless otherwise advised by your provider.  Spotting may occur after intercourse; report any bright red bleeding that is heavier than spotting.  Labor:  If you know that you are in labor, please go to the hospital.  If you are unsure, please call the office and let us help you decide what to do.  Lifting, straining, etc:  If your job requires heavy lifting or straining please check with your provider for any limitations.  Generally, you should not lift items heavier than that you can lift simply with your hands and arms (no back muscles)  Painting:  Paint fumes do not harm your pregnancy, but may make you ill and should be avoided if possible.  Latex or water based paints have less odor than oils.  Use adequate ventilation while painting.  Permanents & Hair Color:  Chemicals in hair dyes are not recommended as they cause increase hair dryness which can increase hair loss during pregnancy.  " Highlighting" and permanents are allowed.  Dye may be absorbed differently and permanents may not hold as well during pregnancy.  Sunbathing:  Use a sunscreen, as skin burns easily during pregnancy.  Drink plenty of fluids; avoid over heating.  Tanning Beds:  Because their possible side effects are still unknown, tanning beds are not recommended.  Ultrasound Scans:  Routine ultrasounds are performed at approximately 20 weeks.  You will be able to see your baby's general anatomy an if you would like to know the gender this can usually be determined as well.  If it is questionable when you conceived you may  also  receive an ultrasound early in your pregnancy for dating purposes.  Otherwise ultrasound exams are not routinely performed unless there is a medical necessity.  Although you can request a scan we ask that you pay for it when conducted because insurance does not cover " patient request" scans.  Work: If your pregnancy proceeds without complications you may work until your due date, unless your physician or employer advises otherwise.  Round Ligament Pain/Pelvic Discomfort:  Sharp, shooting pains not associated with bleeding are fairly common, usually occurring in the second trimester of pregnancy.  They tend to be worse when standing up or when you remain standing for long periods of time.  These are the result of pressure of certain pelvic ligaments called "round ligaments".  Rest, Tylenol and heat seem to be the most effective relief.  As the womb and fetus grow, they rise out of the pelvis and the discomfort improves.  Please notify the office if your pain seems different than that described.  It may represent a more serious condition.

## 2024-07-13 NOTE — Progress Notes (Signed)
 New OB Intake  I connected with  Robin Houston on 07/13/24 at  2:15 PM EDT by MyChart Video Visit and verified that I am speaking with the correct person using two identifiers. Nurse is located at [facility] and pt is located in car traveling with mom (patient is not driving).  I discussed the limitations, risks, security and privacy concerns of performing an evaluation and management service by telephone and the availability of in person appointments. I also discussed with the patient that there may be a patient responsible charge related to this service. The patient expressed understanding and agreed to proceed.  I explained I am completing New OB Intake today. We discussed her EDD of 02/27/25 that is based on LMP of 05/23/24. Pt is G5/P1. I reviewed her allergies, medications, Medical/Surgical/OB history, and appropriate screenings. There are cats in the home: no. Based on history, this is a/an pregnancy uncomplicated . Her obstetrical history is significant for pre-eclampsia.  Patient Active Problem List   Diagnosis Date Noted   Supervision of other normal pregnancy, antepartum 07/13/2024   History of pre-eclampsia in prior pregnancy, currently pregnant 07/13/2024   Symptomatic anemia 03/31/2020    Concerns addressed today: None  Delivery Plans:  Plans to deliver at [facility].  Anatomy US  Explained first scheduled US  will be 08/02/24. Anatomy US  will be scheduled around [redacted] weeks gestational age.  Labs Discussed genetic screening with patient. Patient desires genetic testing to be drawn at new OB visit. Discussed possible labs to be drawn at new OB appointment.  COVID Vaccine Patient has had COVID vaccine.   Social Determinants of Health Food Insecurity: denies food insecurity Transportation: Patient denies transportation needs. Childcare: Discussed no children allowed at ultrasound appointments.   First visit review I reviewed new OB appt with pt. I  explained she will have blood work and pap smear/pelvic exam if indicated. Explained pt will be seen by Harlene Cisco, CNM at first visit; encounter routed to appropriate provider.   Beola Skeens, CMA 07/13/2024  2:29 PM

## 2024-08-02 ENCOUNTER — Ambulatory Visit: Payer: Self-pay

## 2024-08-02 DIAGNOSIS — Z3687 Encounter for antenatal screening for uncertain dates: Secondary | ICD-10-CM

## 2024-08-02 DIAGNOSIS — Z3A09 9 weeks gestation of pregnancy: Secondary | ICD-10-CM

## 2024-08-15 ENCOUNTER — Encounter: Payer: Self-pay | Admitting: Certified Nurse Midwife

## 2024-08-16 ENCOUNTER — Telehealth: Payer: Self-pay

## 2024-08-16 NOTE — Telephone Encounter (Signed)
 Patient called triage line to report she noted some light pink and brown blood in her underwear and on the toilet paper while at work today.  She reports last intercourse was Sunday.  She says she's had one cramp right before noticing the bleeding, but denies any continued pelvic pain.  Counseled on first trimester bleeding and advised sex will often cause harmless bleeding that can be red, pink or brown.  Advised she monitor and report back if it becomes heavy like a period or she notices any blood clots or tissue.  Patient verbalized understanding and agreement.  She has an appointment next week for her initial physical.

## 2024-08-20 NOTE — Progress Notes (Signed)
 NEW OB HISTORY AND PHYSICAL  SUBJECTIVE:       Robin Houston is a 28 y.o. [phone] female, Patient's last menstrual period was 05/23/2024 (exact date)., Estimated Date of Delivery: 02/27/25, [redacted]w[redacted]d, presents today for establishment of Prenatal Care. Planned, desired pregnancy, anxious due to hx of miscarriage. Accompanied by her mother today. Continues on metformin , fasting blood sugars have been in 120s, she would like to switch to insulin  which she took in her prior pregnancy. She reports nausea with vomiting for 1 days   Social history Partner/Relationship: Dorn Living situation: partner & son Work: [employer] Exercise: no Substance use: no T/E/D  Indications for ASA therapy (per uptodate) One of the following: Previous pregnancy with preeclampsia, especially early onset and with an adverse outcome Yes Multifetal gestation No Chronic hypertension No Type 1 or 2 diabetes mellitus Yes Chronic kidney disease No Autoimmune disease (antiphospholipid syndrome, systemic lupus erythematosus) No  Two or more of the following: Nulliparity No Obesity (body mass index >30 kg/m2) Yes Family history of preeclampsia in mother or sister No Age >=35 years No Sociodemographic characteristics (African American race, low socioeconomic level) Yes Personal risk factors (eg, previous pregnancy with low birth weight or small for gestational age infant, previous adverse pregnancy outcome [eg, stillbirth], interval >10 years between pregnancies) No ASA recommended  Gynecologic History Patient's last menstrual period was 05/23/2024 (exact date).  Contraception: none Last Pap: 04/08/2021. Results were: normal  Obstetric History OB History  Gravida Para Term Preterm AB Living  5 1 1  0 3 1  SAB IAB Ectopic Multiple Live Births  3 0 0 0 1    # Outcome Date GA Lbr Len/2nd Weight Sex Type Anes PTL Lv  5 Current           4 SAB 04/20/24 [redacted]w[redacted]d         3 Term 11/04/21 [redacted]w[redacted]d  8 lb 1.1 oz (3.66 kg)  M CS-Vac EPI  LIV  2 SAB 07/22/20          1 SAB 02/2020            Past Medical History:  Diagnosis Date   Anemia    Diabetes mellitus without complication (HCC)    type 2    Past Surgical History:  Procedure Laterality Date   CESAREAN SECTION  11/04/2021   Procedure: CESAREAN SECTION;  Surgeon: Leonce Garnette BIRCH, MD;  Location: [facility] ORS;  Service: Obstetrics;;    Current Outpatient Medications on File Prior to Visit  Medication Sig Dispense Refill   ferrous sulfate  325 (65 FE) MG EC tablet Take 325 mg by mouth 3 (three) times daily with meals.     metFORMIN  (GLUCOPHAGE ) 1000 MG tablet TAKE 1 TABLET (1,000 MG TOTAL) BY MOUTH 2 (TWO) TIMES DAILY WITH A MEAL. 180 tablet 3   Prenatal Vit-Fe Fumarate-FA (PRENATAL PO) Take by mouth.     No current facility-administered medications on file prior to visit.    No Known Allergies  Social History   Socioeconomic History   Marital status: Married    Spouse name: Not on file   Number of children: 1   Years of education: Not on file   Highest education level: GED or equivalent  Occupational History   Occupation: Psychologist, educational for [employer]    Employer: [employer]  Tobacco Use   Smoking status: Never   Smokeless tobacco: Never  Vaping Use   Vaping status: Never Used  Substance and Sexual Activity   Alcohol use: Never  Drug use: Never   Sexual activity: Yes    Partners: Male    Birth control/protection: None  Other Topics Concern   Not on file  Social History Narrative   Not on file   Social Drivers of Health   Financial Resource Strain: Low Risk  (07/11/2024)   Overall Financial Resource Strain (CARDIA)    Difficulty of Paying Living Expenses: Not very hard  Food Insecurity: No Food Insecurity (07/11/2024)   Hunger Vital Sign    Worried About Running Out of Food in the Last Year: Never true    Ran Out of Food in the Last Year: Never true  Transportation Needs: No Transportation Needs (07/11/2024)   PRAPARE -  Administrator, Civil Service (Medical): No    Lack of Transportation (Non-Medical): No  Physical Activity: Insufficiently Active (07/11/2024)   Exercise Vital Sign    Days of Exercise per Week: 4 days    Minutes of Exercise per Session: 10 min  Stress: No Stress Concern Present (07/11/2024)   [employer] of Occupational Health - Occupational Stress Questionnaire    Feeling of Stress: Only a little  Social Connections: Socially Integrated (07/11/2024)   Social Connection and Isolation Panel    Frequency of Communication with Friends and Family: More than three times a week    Frequency of Social Gatherings with Friends and Family: More than three times a week    Attends Religious Services: More than 4 times per year    Active Member of [employer] or Organizations: Yes    Attends Engineer, structural: More than 4 times per year    Marital Status: Married  Catering manager Violence: Not At Risk (07/13/2024)   Humiliation, Afraid, Rape, and Kick questionnaire    Fear of Current or Ex-Partner: No    Emotionally Abused: No    Physically Abused: No    Sexually Abused: No    Family History  Problem Relation Age of Onset   Thyroid disease Mother    Diabetes Father    Diabetes Paternal Grandmother     The following portions of the patient's history were reviewed and updated as appropriate: allergies, current medications, past OB history, past medical history, past surgical history, past family history, past social history, and problem list.  Constitutional: Denied constitutional symptoms, night sweats, recent illness, fatigue, fever, insomnia and weight loss.  Eyes: Denied eye symptoms, eye pain, photophobia, vision change and visual disturbance.  Ears/Nose/Throat/Neck: Denied ear, nose, throat or neck symptoms, hearing loss, nasal discharge, sinus congestion and sore throat.  Cardiovascular: Denied cardiovascular symptoms, arrhythmia, chest pain/pressure, edema, exercise  intolerance, orthopnea and palpitations.  Respiratory: Denied pulmonary symptoms, asthma, pleuritic pain, productive sputum, cough, dyspnea and wheezing.  Gastrointestinal: Denied gastro-esophageal reflux, melena, nausea and vomiting.  Genitourinary: Denied genitourinary symptoms including symptomatic vaginal discharge, pelvic relaxation issues, and urinary complaints.  Musculoskeletal: Denied musculoskeletal symptoms, stiffness, swelling, muscle weakness and myalgia.  Dermatologic: Denied dermatology symptoms, rash and scar.  Neurologic: Denied neurology symptoms, dizziness, headache, neck pain and syncope.  Psychiatric: Denied psychiatric symptoms, anxiety and depression.  Endocrine: Denied endocrine symptoms including hot flashes and night sweats.     OBJECTIVE: Initial Physical Exam (New OB)  Physical Exam Vitals reviewed.  Constitutional:      General: She is not in acute distress.    Appearance: Normal appearance. She is obese. She is not ill-appearing.  Neck:     Thyroid: No thyroid mass or thyromegaly.  Cardiovascular:  Rate and Rhythm: Normal rate and regular rhythm.     Heart sounds: Normal heart sounds.  Pulmonary:     Effort: Pulmonary effort is normal.     Breath sounds: Normal breath sounds.  Abdominal:     Palpations: Abdomen is soft.     Tenderness: There is no abdominal tenderness.  Genitourinary:    General: Normal vulva.     Cervix: Normal.  Skin:    General: Skin is warm and dry.  Neurological:     General: No focal deficit present.     Mental Status: She is alert and oriented to person, place, and time.  Psychiatric:        Mood and Affect: Mood normal.        Behavior: Behavior normal.     Fetal Heart Rate (bpm):  (BSUS confirms CA)  ASSESSMENT: Normal pregnancy   PLAN: Routine prenatal care. We discussed an overview of prenatal care and when to call. Reviewed diet, exercise, and weight gain recommendations in pregnancy. Discussed benefits  of breastfeeding and lactation resources at [facility]. I reviewed labs and answered all questions.  MFM referral placed for anatomy ultrasound Start insulin  with nightly lantus , continue metformin  & will transition off CGM & CBG monitors ordered, reviewed will need pre-auth for CGM. Start checking qid with goal of fasting <95, 2h postprandial <120 Reviewed fetal echo recommended due to pre-existing diabetes Route of delivery-unsure TOLAC vs rCD, wants whatever is best for baby  1. [redacted] weeks gestation of pregnancy (Primary) - NOB Panel - Culture, OB Urine - Monitor Drug Profile 14(MW) - Nicotine  screen, urine - Urinalysis, Routine w reflex microscopic - Protein / creatinine ratio, urine - Comprehensive metabolic panel with GFR - Hemoglobin A1c  2. Supervision of high risk pregnancy in first trimester - NOB Panel - Culture, OB Urine - Monitor Drug Profile 14(MW) - Nicotine  screen, urine - Urinalysis, Routine w reflex microscopic - Protein / creatinine ratio, urine - Comprehensive metabolic panel with GFR - Hemoglobin A1c  3. History of pre-eclampsia in prior pregnancy, currently pregnant - NOB Panel - Protein / creatinine ratio, urine - Comprehensive metabolic panel with GFR - Hemoglobin A1c  4. Encounter for drug screening - Monitor Drug Profile 14(MW) - Nicotine  screen, urine  5. Genetic screening  6. Immunity status testing - NOB Panel  7. Exposure to cat feces, initial encounter - NOB Panel  8. Screening examination for STD (sexually transmitted disease) - NOB Panel - Cervicovaginal ancillary only  9. Screening for iron  deficiency anemia - NOB Panel  10. Screening for cervical cancer - Cytology - PAP  11. Screening for diabetes mellitus - Hemoglobin A1c  12. Need for influenza vaccination  13. Encounter for supervision of normal first pregnancy in second trimester - PANORAMA PRENATAL TEST  14. Encounter for immunization - Flu vaccine trivalent PF, 6mos and  older(Flulaval,Afluria,Fluarix,Fluzone)  15. Pre-existing diabetes mellitus during pregnancy in second trimester - Referral to Nutrition and Diabetes Services   Harlene LITTIE Cisco, CNM

## 2024-08-21 ENCOUNTER — Other Ambulatory Visit (HOSPITAL_COMMUNITY)
Admission: RE | Admit: 2024-08-21 | Discharge: 2024-08-21 | Disposition: A | Discharge status: Home / Self Care | Source: Ambulatory Visit | Attending: Certified Nurse Midwife | Admitting: Certified Nurse Midwife

## 2024-08-21 ENCOUNTER — Ambulatory Visit (INDEPENDENT_AMBULATORY_CARE_PROVIDER_SITE_OTHER): Payer: Self-pay | Admitting: Certified Nurse Midwife

## 2024-08-21 VITALS — BP 112/75 | HR 99 | Wt 274.7 lb

## 2024-08-21 DIAGNOSIS — Z23 Encounter for immunization: Secondary | ICD-10-CM | POA: Diagnosis not present

## 2024-08-21 DIAGNOSIS — Z124 Encounter for screening for malignant neoplasm of cervix: Secondary | ICD-10-CM | POA: Diagnosis present

## 2024-08-21 DIAGNOSIS — Z0184 Encounter for antibody response examination: Secondary | ICD-10-CM

## 2024-08-21 DIAGNOSIS — Z131 Encounter for screening for diabetes mellitus: Secondary | ICD-10-CM

## 2024-08-21 DIAGNOSIS — Z0283 Encounter for blood-alcohol and blood-drug test: Secondary | ICD-10-CM

## 2024-08-21 DIAGNOSIS — Z3A12 12 weeks gestation of pregnancy: Secondary | ICD-10-CM

## 2024-08-21 DIAGNOSIS — T7589XA Other specified effects of external causes, initial encounter: Secondary | ICD-10-CM

## 2024-08-21 DIAGNOSIS — Z130 Encounter for screening for diseases of the blood and blood-forming organs and certain disorders involving the immune mechanism: Secondary | ICD-10-CM

## 2024-08-21 DIAGNOSIS — Z113 Encounter for screening for infections with a predominantly sexual mode of transmission: Secondary | ICD-10-CM

## 2024-08-21 DIAGNOSIS — O0991 Supervision of high risk pregnancy, unspecified, first trimester: Secondary | ICD-10-CM

## 2024-08-21 DIAGNOSIS — Z3402 Encounter for supervision of normal first pregnancy, second trimester: Secondary | ICD-10-CM

## 2024-08-21 DIAGNOSIS — O24312 Unspecified pre-existing diabetes mellitus in pregnancy, second trimester: Secondary | ICD-10-CM

## 2024-08-21 DIAGNOSIS — O09299 Supervision of pregnancy with other poor reproductive or obstetric history, unspecified trimester: Secondary | ICD-10-CM

## 2024-08-21 DIAGNOSIS — Z1379 Encounter for other screening for genetic and chromosomal anomalies: Secondary | ICD-10-CM

## 2024-08-21 MED ORDER — DEXCOM G7 RECEIVER DEVI: 1.0000 | Freq: Once | 0 refills | Status: AC

## 2024-08-21 MED ORDER — DEXCOM G7 SENSOR MISC: 1.0000 | 11 refills | Status: DC

## 2024-08-21 MED ORDER — LANCETS MISC. MISC: 1.0000 | Freq: Three times a day (TID) | 2 refills | Status: AC

## 2024-08-21 MED ORDER — INSULIN GLARGINE 100 UNIT/ML SOLOSTAR PEN: 14.0000 [IU] | PEN_INJECTOR | Freq: Every day | SUBCUTANEOUS | 1 refills | Status: DC

## 2024-08-21 MED ORDER — BLOOD GLUCOSE MONITORING SUPPL DEVI: 1.0000 | Freq: Four times a day (QID) | 0 refills | Status: AC

## 2024-08-21 MED ORDER — LANCET DEVICE MISC: 1.0000 | Freq: Three times a day (TID) | 0 refills | Status: AC

## 2024-08-21 MED ORDER — BLOOD GLUCOSE TEST VI STRP: 1.0000 | ORAL_STRIP | Freq: Four times a day (QID) | 3 refills | Status: DC

## 2024-08-22 ENCOUNTER — Encounter: Payer: Self-pay | Admitting: Certified Nurse Midwife

## 2024-08-22 DIAGNOSIS — O09899 Supervision of other high risk pregnancies, unspecified trimester: Secondary | ICD-10-CM | POA: Insufficient documentation

## 2024-08-22 LAB — CERVICOVAGINAL ANCILLARY ONLY
Chlamydia: NEGATIVE
Comment: NEGATIVE
Comment: NORMAL
Neisseria Gonorrhea: NEGATIVE

## 2024-08-22 LAB — CBC/D/PLT+RPR+RH+ABO+RUBIGG...
Antibody Screen: NEGATIVE
Basophils Absolute: 0 x10E3/uL (ref 0.0–0.2)
Basos: 0 %
EOS (ABSOLUTE): 0.1 x10E3/uL (ref 0.0–0.4)
Eos: 1 %
HCV Ab: NONREACTIVE
HIV Screen 4th Generation wRfx: NONREACTIVE
Hematocrit: 40 % (ref 34.0–46.6)
Hemoglobin: 12.6 g/dL (ref 11.1–15.9)
Hepatitis B Surface Ag: NEGATIVE
Immature Grans (Abs): 0.1 x10E3/uL (ref 0.0–0.1)
Immature Granulocytes: 1 %
Lymphocytes Absolute: 2 x10E3/uL (ref 0.7–3.1)
Lymphs: 22 %
MCH: 25.9 pg — ABNORMAL LOW (ref 26.6–33.0)
MCHC: 31.5 g/dL (ref 31.5–35.7)
MCV: 82 fL (ref 79–97)
Monocytes Absolute: 0.5 x10E3/uL (ref 0.1–0.9)
Monocytes: 5 %
Neutrophils Absolute: 6.4 x10E3/uL (ref 1.4–7.0)
Neutrophils: 71 %
Platelets: 323 x10E3/uL (ref 150–450)
RBC: 4.87 x10E6/uL (ref 3.77–5.28)
RDW: 14.7 % (ref 11.7–15.4)
RPR Ser Ql: NONREACTIVE
Rh Factor: POSITIVE
Rubella Antibodies, IGG: <0.9 {index} — ABNORMAL LOW (ref >0.99)
Varicella zoster IgG: REACTIVE
WBC: 9 x10E3/uL (ref 3.4–10.8)

## 2024-08-22 LAB — URINALYSIS, ROUTINE W REFLEX MICROSCOPIC
Bilirubin, UA: NEGATIVE
Leukocytes,UA: NEGATIVE
Nitrite, UA: NEGATIVE
RBC, UA: NEGATIVE
Specific Gravity, UA: >=1.03 — AB (ref 1.005–1.030)
Urobilinogen, Ur: 1 mg/dL (ref 0.2–1.0)
pH, UA: 5.5 (ref 5.0–7.5)

## 2024-08-22 LAB — COMPREHENSIVE METABOLIC PANEL WITH GFR
ALT: 7 IU/L (ref 0–32)
AST: 10 IU/L (ref 0–40)
Albumin: 4 g/dL (ref 4.0–5.0)
Alkaline Phosphatase: 66 IU/L (ref 41–116)
BUN/Creatinine Ratio: 24 — ABNORMAL HIGH (ref 9–23)
BUN: 13 mg/dL (ref 6–20)
Bilirubin Total: <0.2 mg/dL (ref 0.0–1.2)
CO2: 23 mmol/L (ref 20–29)
Calcium: 9.7 mg/dL (ref 8.7–10.2)
Chloride: 100 mmol/L (ref 96–106)
Creatinine, Ser: 0.54 mg/dL — ABNORMAL LOW (ref 0.57–1.00)
Globulin, Total: 3.2 g/dL (ref 1.5–4.5)
Glucose: 124 mg/dL — ABNORMAL HIGH (ref 70–99)
Potassium: 4 mmol/L (ref 3.5–5.2)
Sodium: 137 mmol/L (ref 134–144)
Total Protein: 7.2 g/dL (ref 6.0–8.5)
eGFR: 129 mL/min/1.73 (ref >59)

## 2024-08-22 LAB — CORTISOL: Cortisol: 12.1 ug/dL (ref 6.2–19.4)

## 2024-08-22 LAB — PROTEIN / CREATININE RATIO, URINE
Creatinine, Urine: 148.6 mg/dL
Protein, Ur: 14.4 mg/dL
Protein/Creat Ratio: 97 mg/g{creat} (ref 0–200)

## 2024-08-22 LAB — TSH+FREE T4
Free T4: 1.1 ng/dL (ref 0.82–1.77)
TSH: 1.43 u[IU]/mL (ref 0.450–4.500)

## 2024-08-22 LAB — HCV INTERPRETATION

## 2024-08-23 LAB — MONITOR DRUG PROFILE 14(MW)
Amphetamine Scrn, Ur: NEGATIVE ng/mL
BARBITURATE SCREEN URINE: NEGATIVE ng/mL
BENZODIAZEPINE SCREEN, URINE: NEGATIVE ng/mL
Buprenorphine, Urine: NEGATIVE ng/mL
CANNABINOIDS UR QL SCN: NEGATIVE ng/mL
Cocaine (Metab) Scrn, Ur: NEGATIVE ng/mL
Creatinine(Crt), U: 169.8 mg/dL (ref 20.0–300.0)
Fentanyl, Urine: NEGATIVE pg/mL
Meperidine Screen, Urine: NEGATIVE ng/mL
Methadone Screen, Urine: NEGATIVE ng/mL
OXYCODONE+OXYMORPHONE UR QL SCN: NEGATIVE ng/mL
Opiate Scrn, Ur: NEGATIVE ng/mL
Ph of Urine: 5.5 (ref 4.5–8.9)
Phencyclidine Qn, Ur: NEGATIVE ng/mL
Propoxyphene Scrn, Ur: NEGATIVE ng/mL
SPECIFIC GRAVITY: 1.023
Tramadol Screen, Urine: NEGATIVE ng/mL

## 2024-08-23 LAB — CYTOLOGY - PAP: Diagnosis: NEGATIVE

## 2024-08-23 LAB — NICOTINE SCREEN, URINE: Cotinine Ql Scrn, Ur: NEGATIVE ng/mL

## 2024-08-23 LAB — URINE CULTURE, OB REFLEX

## 2024-08-23 LAB — CULTURE, OB URINE

## 2024-08-25 ENCOUNTER — Ambulatory Visit: Payer: Self-pay | Admitting: Certified Nurse Midwife

## 2024-08-25 DIAGNOSIS — Z3482 Encounter for supervision of other normal pregnancy, second trimester: Secondary | ICD-10-CM

## 2024-08-25 MED ORDER — ASPIRIN 81 MG PO TBEC: 81.0000 mg | DELAYED_RELEASE_TABLET | Freq: Every day | ORAL | 2 refills | Status: AC

## 2024-08-29 ENCOUNTER — Encounter: Payer: Self-pay | Admitting: Dietician

## 2024-08-29 ENCOUNTER — Encounter: Attending: Certified Nurse Midwife | Admitting: Dietician

## 2024-08-29 DIAGNOSIS — O24312 Unspecified pre-existing diabetes mellitus in pregnancy, second trimester: Secondary | ICD-10-CM | POA: Insufficient documentation

## 2024-08-29 DIAGNOSIS — O0992 Supervision of high risk pregnancy, unspecified, second trimester: Secondary | ICD-10-CM | POA: Diagnosis not present

## 2024-08-29 DIAGNOSIS — Z3A14 14 weeks gestation of pregnancy: Secondary | ICD-10-CM | POA: Diagnosis not present

## 2024-08-29 DIAGNOSIS — Z713 Dietary counseling and surveillance: Secondary | ICD-10-CM | POA: Insufficient documentation

## 2024-08-29 DIAGNOSIS — O24119 Pre-existing diabetes mellitus, type 2, in pregnancy, unspecified trimester: Secondary | ICD-10-CM

## 2024-08-29 LAB — PANORAMA PRENATAL TEST FULL PANEL:PANORAMA TEST PLUS 5 ADDITIONAL MICRODELETIONS: FETAL FRACTION: 2.7

## 2024-08-29 NOTE — Progress Notes (Signed)
 Patient was seen for Pre-existing Diabetes During Pregnancy) on 08/29/2024  Start time 0915 and End time 1040   Estimated due date: 02/27/2025; [redacted]w[redacted]d  Clinical: Medications: Lantus  @14u , Metformin  @1000  mg BID, Iron  65 FE TID, Prenatal Medical History: T2DM, Preeclampsia, Anemia Labs: PPBG - 124 mg/dL on 0/83/7974  Dietary and Lifestyle History: Pt reports history of T2DM in prior pregnancy, has been on Metformin  @1000  mg BID since 2021, reports taking insulin  in last pregnancy, reports starting Lantus  @14u  daily at bedtime a week ago, states they have not seen a noticeable change in glucose readings since starting. Pt states they are checking glucose 4x daily, has Dexcom G7 but prefers fingerstick testing because they are used to it. Pt reports working at [employer], eats food from there often, will make meals with bake foods and frozen veggies eating when at home. Pt reports no structured exercise but is active at work and states their toddler keeps them busy at home.   Physical Activity: ADLs Stress: Low Sleep: Ok, occasional difficulty falling asleep at times  24 hr Recall:  First Meal:  Grilled chicken breakfast burrito, fruit and yogurt ([employer]) Snack:  None Second meal:  Grilled Chicken wrap, a few fries ([employer]) Snack:  Fruit cup, apple slices Third meal: Baked Mac and cheese ([employer]) Snack:  None Beverages:  Water, unsweet tea  NUTRITION INTERVENTION  Nutrition education (E-1) on the following topics:   Initial Follow-up  [x]  []  Definition of Diabetes [x]  []  Why dietary management is important in controlling blood glucose [x]  []  Effects each nutrient has on blood glucose levels [x]  []  Simple carbohydrates vs complex carbohydrates [x]  []  Fluid intake [x]  []  Creating a balanced meal plan [x]  []  Carbohydrate counting  [x]  []  When to check blood glucose levels [x]  []  Proper blood glucose monitoring techniques [x]  []  Effect of stress and stress reduction  techniques  [x]  []  Exercise effect on blood glucose levels, appropriate exercise during pregnancy [x]  []  Importance of limiting caffeine and abstaining from alcohol and smoking [x]  []  Medications used for blood sugar control during pregnancy [x]  []  Hypoglycemia and rule of 15 [x]  []  Postpartum self care   Patient has a meter prior to visit. Patient is testing pre breakfast and 2 hours after each meal. FBS: 95-98 mg/dL Postprandial: 879-871 mg/dL  Patient instructed to monitor glucose levels: FBS: 60 - <= 95 mg/dL; 2 hour: <= 879 mg/dL  Patient received handouts: Pre-Existing Diabetes and Pregnancy General Nutrition Guidelines for Gestational Diabetes Carbohydrate Counting List Blood glucose log Snack ideas for diabetes during pregnancy  Patient will be seen for follow-up as needed.

## 2024-08-30 ENCOUNTER — Telehealth: Payer: Self-pay

## 2024-08-30 NOTE — Telephone Encounter (Signed)
 Received call from patient wondering about her Panorama results.  They came back yesterday Insufficient Fetal DNA.  She wonders if redrawing her sample would allow them to get enough DNA.  Advised that many factors go into interpreting these tests and sometimes they just aren't able to get what they need.  Based on the report, given her weight, singleton pregnancy and FF of 2.7%, she has a 67% chance of success with a redraw.  She would like to try.  Appointment made for tomorrow at 2:15 for a blood draw.

## 2024-08-31 ENCOUNTER — Ambulatory Visit

## 2024-08-31 NOTE — Progress Notes (Unsigned)
    Return Prenatal Note   Subjective   28 y.o. [id number] at [redacted]w[redacted]d presents for this follow-up prenatal visit.  Patient *** Patient reports:    Objective   Flow sheet Vitals:   Total weight gain: 14 lb 11.2 oz (6.668 kg)  General Appearance  No acute distress, well appearing, and well nourished Pulmonary   Normal work of breathing Neurologic   Alert and oriented to person, place, and time Psychiatric   Mood and affect within normal limits   Assessment/Plan   Plan  28 y.o. [id number] at [redacted]w[redacted]d presents for follow-up OB visit. Reviewed prenatal record including previous visit note.   Date Fasting Breakfast Lunch Dinner                                                   Highest /lowest fasting: 2 hour post prandial Highest/lowest Breakfast: Highest lowest Lunch: Highest/lowest Dinner:  No problem-specific Assessment & Plan notes found for this encounter.      No orders of the defined types were placed in this encounter.  No follow-ups on file.   Future Appointments  Date Time Provider Department Center  09/03/2024  3:15 PM Jayne Harlene CROME, CNM [facility] None  09/18/2024  8:35 AM Leigh Sober, MD [facility] None    For next visit:  {[email or web address]}     Judah Chevere L Hughes Wyndham, CMA  09/26/254:55 PM

## 2024-08-31 NOTE — Progress Notes (Signed)
 Pt is here for [facility] RE DRAW [facility] AWARE 1 TUBE AND .5 OF ANOTHER TUBE WERE SENT TO [facility]

## 2024-09-03 ENCOUNTER — Telehealth (INDEPENDENT_AMBULATORY_CARE_PROVIDER_SITE_OTHER): Admitting: Certified Nurse Midwife

## 2024-09-03 DIAGNOSIS — O0991 Supervision of high risk pregnancy, unspecified, first trimester: Secondary | ICD-10-CM

## 2024-09-03 DIAGNOSIS — Z3A14 14 weeks gestation of pregnancy: Secondary | ICD-10-CM

## 2024-09-03 DIAGNOSIS — O0992 Supervision of high risk pregnancy, unspecified, second trimester: Secondary | ICD-10-CM | POA: Diagnosis not present

## 2024-09-04 ENCOUNTER — Other Ambulatory Visit: Payer: Self-pay | Admitting: Certified Nurse Midwife

## 2024-09-04 MED ORDER — INSULIN PEN NEEDLE 32G X 4 MM MISC: 1.0000 | Freq: Four times a day (QID) | 6 refills | Status: AC

## 2024-09-10 NOTE — Progress Notes (Signed)
    Return Prenatal Note   Subjective  28 y.o. [id number] at [redacted]w[redacted]d presents for this follow-up prenatal visit. Pregnancy notable for T2DM on insulin , elevated BMI, hx of preeclampsia, hx of prior CD x 1, and rubella non-immune.  Patient well today, BG log below. Is taking Lantus  14u at bedtime and no longer taking Metformin , pt states CNM Jayne told her to stop. She is taking daily ASA.   Date Fasting Breakfast Lunch Dinner  10/1 88 97 114 122  10/2 90 120 95 118  10/3 95 96 118 120  10/4 92 117 119 123  10/5 93 110 113 120  10/6 91 101 119 114  10/7 90 108 116 120   Date Fasting Breakfast Lunch Dinner  10/8 93 117 113 121  10/9 94 116 102 121  10/10 92 110 118 122  10/11 90 118 123 119  10/12 90 120 113 118                Highest /lowest fasting: 95/88  2 hour post prandial Highest/lowest Breakfast:  120/96 Highest lowest Lunch:  123/95 Highest/lowest Dinner: 123/114; 8/12 elevated  Patient reports: Movement: Present Contractions: Not present Denies vaginal bleeding or leaking fluid. Objective  Flow sheet Vitals: Pulse Rate: 94 BP: 121/63 Fetal Heart Rate (bpm): 147 Total weight gain: 22 lb (9.979 kg)  General Appearance  No acute distress, well appearing, and well nourished Pulmonary   Normal work of breathing Neurologic   Alert and oriented to person, place, and time Psychiatric   Mood and affect within normal limits   Assessment/Plan   Plan  28 y.o. [id number] at [redacted]w[redacted]d by LMP=9wk US  presents for follow-up OB visit. Reviewed prenatal record including previous visit note.  1. Supervision of high risk pregnancy, antepartum (Primary)  2. Pre-existing diabetes mellitus during pregnancy in second trimester -Pt on Lantus  14u at bedtime and dinner postprandials elevated. Upon further discussion, pt admits that she can improve her carb intake at dinner and would prefer not to increase doing or add Metformin  back in.  -Cotninue Lantus  14u at bedtime, improve carbs  at dinner, send in BG log to MyChart in 1wk for review -MFM consult ordered, discussed necessity of fetal echo  3. History of pre-eclampsia in prior pregnancy, currently pregnant -Baseline PIH wnl, continue ASA daily  4. Obesity in pregnancy -PG BMI 43.27, current 46.93; TWG 22#, cautioned to slow, incorporate daily physical activity -Anesthesia consult needed, attempted to schedule today, no response, will continue -Anatomy US  with MFM ordered   Orders Placed This Encounter  Procedures   US  MFM OB COMP + 14 WK    Standing Status:   Future    Expiration Date:   11/18/2025    Reason for Exam (SYMPTOM  OR DIAGNOSIS REQUIRED):   T2DM, BMI 46.9, anatomy US  with consult    Preferred Location:   Perinatal Consultation Clinic @ [facility]   Return in about 4 weeks (around 10/16/2024) for ROB.   Future Appointments  Date Time Provider Department Center  10/16/2024  1:00 PM [facility] US  1 [facility] None  10/16/2024  2:35 PM Slaughterbeck, Damien, CNM [facility] None    For next visit:  continue with routine prenatal care   Estil Mangle, DO [facility]

## 2024-09-10 NOTE — Patient Instructions (Signed)
 Type 1 or Type 2 Diabetes Mellitus During Pregnancy, Self Care Pregnancy causes changes in your body that can affect your diabetes. When you have type 1 or type 2 diabetes, you need to be careful during pregnancy to keep your blood sugar (glucose) in a healthy range. You can do this with: Healthy eating. Exercise. Insulin  or medicines, if needed. Your health care provider may need to add medicines or increase your doses while you are pregnant. Keeping your blood sugar under control will help keep you and your baby healthy. How does having diabetes during pregnancy affect me? Having diabetes can increase your risk of other conditions, such as: High blood pressure during pregnancy (preeclampsia). Heart disease. Kidney disease. How does this affect the baby? Diabetes can be harmful to an unborn baby. Some problems include: Risk for pregnancy loss and stillbirth. Problems with the organ that provides oxygen and nutrition to the baby (placenta). High birth weight. Diabetes can also affect a baby after birth. Some problems include: Low blood sugar. Breathing problems. What are blood sugar goals during pregnancy? Your provider will set treatment goals for you. In general, you should stay within these blood sugar levels: After not eating (fasting) for 8 hours: 70 to 95 mg/dL (3.9 to 5.3 mmol/L). After meals: One hour after a meal: 110 to 140 mg/dL (6.1 to 7.8 mmol/L). Two hours after a meal: 100 to 120 mg/dL (5.6 to 6.7 mmol/L). HbA1C level: less than 6%. After 16 weeks of pregnancy, you may need more insulin  to stay in the goal ranges. Or you may need insulin  if you have not needed it before. Be sure to: Check your blood sugar as often as told by your provider. Call your provider if your blood sugar is above your target for 2 tests in a row. Why do I have a low blood sugar? Low blood sugar (hypoglycemia) can occur if you do not eat enough food, you skip a meal, or you do not eat at the right  time of day. It can also happen if you exercise too much. If you have type 1 diabetes, you have a higher risk of having low blood sugar earlier in your pregnancy. You may not feel the symptoms of low blood sugar the same as you did before pregnancy. Symptoms include: Dizziness. Feeling shaky. Sweating. Weakness. Follow these instructions at home: Medicines Take over-the-counter and prescription medicines only as told by your provider. Plan ahead to make sure you always have insulin  or any other medicines that you take. Your provider may tell you to take a low-dose aspirin  (81 mg) each day to help prevent high blood pressure while you are pregnant. Eating and drinking What you eat and drink affects your blood sugar and your insulin  dosage. A healthy meal plan includes: Lean proteins, such as chicken, fish, egg whites, and beans. Complex carbohydrates, such as oats, whole wheat, and brown rice. Fresh fruits and vegetables. Low-fat dairy products. Healthy fats, such as those from nuts, seeds, and fish. Meet with a dietitian. This person can help you create an eating plan that is right for you. Make sure that you: Follow instructions about what you may eat and drink. Drink enough fluid to keep your pee (urine) pale yellow. Eat healthy snacks between meals. Keep a record of the carbohydrates that you eat. Do this by reading food labels and learning food serving sizes. Follow your sick-day plan when you cannot eat or drink as usual. Make this plan in advance so it is ready  to use.  Activity Do physical activity for 30 minutes or more each day or as told by your provider. Talk with your provider before you start a new exercise or activity. You may need to make changes to: Your insulin  or medicines. How much food you eat. Care for your body Stay at a healthy weight. The amount of weight you should gain depends on your body mass index (BMI) before you were pregnant. Get an eye exam during the  first 3 months of your pregnancy, or as told. Take good care of your teeth by: Brushing your teeth and gums two times a day. Flossing one or more times a day. Going to the dentist one or more times every 6 months. General instructions Talk with your provider about your risk for high blood pressure during pregnancy. Share your diabetes care plan with: People at your workplace or school. People you live with. Check your pee for ketones when you are sick and as told. Wear a medical alert bracelet or carry a medical alert card. Keep all follow-up visits. Your provider will adjust medicines as needed and check on you and your baby's health. Where to find more information American Diabetes Association (ADA): diabetes.org [employer] (ACOG): acog.org Contact a health care provider if: Your blood sugar is at or above 240 mg/dL (86.6 mmol/L). You have been sick or have had a fever for 2 days or more and you are not getting better. You have any of these problems for more than 6 hours: You cannot eat or drink. You have nausea and vomiting. You have diarrhea. Get help right away if: You have severe hypoglycemia. This means your blood sugar is below 54 mg/dL (3 mmol/L). You become confused. You have trouble breathing. You have chest pain. Your baby is moving less than usual. You have cramping in your abdomen or have pain in your pelvis or lower back. You have symptoms of preeclampsia. These include: A severe, throbbing headache that does not go away. Vision changes, such as blurred or double vision, light sensitivity, or seeing spots in front of your eyes. Sudden or extreme swelling of your face, hands, legs, or feet. These symptoms may be an emergency. Get help right away. Call 911. Do not wait to see if the symptoms will go away. Do not drive yourself to the hospital. This information is not intended to replace advice given to you by your health care  provider. Make sure you discuss any questions you have with your health care provider. Document Revised: 06/30/2022 Document Reviewed: 03/23/2022 Elsevier Patient Education  2024 [employer]Second Trimester of Pregnancy  The second trimester of pregnancy is from week 14 through week 27. This is months 4 through 6 of pregnancy. During the second trimester: Morning sickness is less or has stopped. You may have more energy. You may feel hungry more often. At this time, your unborn baby is growing very fast. At the end of the sixth month, the unborn baby may be up to 12 inches long and weigh about 1 pounds. You will likely start to feel the baby move between 16 and 20 weeks of pregnancy. Body changes during your second trimester Your body continues to change during this time. The changes usually go away after your baby is born. Physical changes You will gain more weight. Your belly will get bigger. You may begin to get stretch marks on your hips, belly, and breasts. Your breasts will keep growing and may hurt. You  may get dark spots or blotches on your face. A dark line from your belly button to the pubic area may appear. This line is called linea nigra. Your hair may grow faster and get thicker. Health changes You may have headaches. You may have heartburn. You may pee more often. You may have swollen, bulging veins (varicose veins). You may have trouble pooping (constipation), or swollen veins in the butt that can itch or get painful (hemorrhoids). You may have back pain. This is caused by: Weight gain. Pregnancy hormones that are relaxing the joints in your pelvis. Follow these instructions at home: Medicines Talk to your health care provider if you're taking medicines. Ask if the medicines are safe to take during pregnancy. Your provider may change the medicines that you take. Do not take any medicines unless told to by your provider. Take a prenatal vitamin that has at least 600  micrograms (mcg) of folic acid . Do not use herbal medicines, illegal drugs, or medicines that are not approved by your provider. Eating and drinking While you're pregnant your body needs extra food for your growing baby. Talk with your provider about what to eat while pregnant. Activity Most women are able to exercise during pregnancy. Exercises may need to change as your pregnancy goes on. Talk to your provider about your activities and exercise routines. Relieving pain and discomfort Wear a good, supportive bra if your breasts hurt. Rest with your legs raised if you have leg cramps or low back pain. Take warm sitz baths to soothe pain from hemorrhoids. Use hemorrhoid cream if your provider says it's okay. Do not douche. Do not use tampons or scented pads. Do not use hot tubs, steam rooms, or saunas. Safety Wear your seatbelt at all times when you're in a car. Talk to your provider if someone hits you, hurts you, or yells at you. Talk with your provider if you're feeling sad or have thoughts of hurting yourself. Lifestyle Certain things can be harmful while you're pregnant. It's best to avoid the following: Do not drink alcohol,smoke, vape, or use products with nicotine  or tobacco in them. If you need help quitting, talk with your provider. Avoid cat litter boxes and soil used by cats. These things carry germs that can cause harm to your pregnancy and your baby. General instructions Keep all follow-up visits. It helps you and your unborn baby stay as healthy as possible. Write down your questions. Take them to your prenatal visits. Your provider will: Talk with you about your overall health. Give you advice or refer you to specialists who can help with different needs, including: Prenatal education classes. Mental health and counseling. Foods and healthy eating. Ask for help if you need help with food. Where to find more information American Pregnancy Association:  americanpregnancy.org [employer]: acog.org Office on [employer] Health: [employer] Contact a health care provider if: You have a headache that does not go away when you take medicine. You have any of these problems: You can't eat or drink. You throw up or feel like you may throw up. You have watery poop (diarrhea) for 2 days or more. You have pain when you pee or your pee smells bad. You have been sick for 2 days or more and are not getting better. Contact your provider right away if: You have any of these coming from your vagina: Abnormal discharge. Bad-smelling fluid. Bleeding. Your baby is moving less than usual. You have contractions, belly cramping, or have pain  in your pelvis or lower back. You have symptoms of high blood pressure or preeclampsia. These include: A severe, throbbing headache that does not go away. Sudden or extreme swelling of your face, hands, legs, or feet. Vision problems: You see spots. You have blurry vision. Your eyes are sensitive to light. If you can't reach the provider, go to an urgent care or emergency room. Get help right away if: You faint, become confused, or can't think clearly. You have chest pain or trouble breathing. You have any kind of injury, such as from a fall or a car crash. These symptoms may be an emergency. Call 911 right away. Do not wait to see if the symptoms will go away. Do not drive yourself to the hospital. This information is not intended to replace advice given to you by your health care provider. Make sure you discuss any questions you have with your health care provider. Document Revised: 08/25/2023 Document Reviewed: 03/25/2023 Elsevier Patient Education  2024 [employer].

## 2024-09-11 ENCOUNTER — Telehealth: Payer: Self-pay | Admitting: Certified Nurse Midwife

## 2024-09-11 NOTE — Telephone Encounter (Signed)
 LMV to review low risk NIPT results. Patient to message via MyChart if she desires to know fetal sex.

## 2024-09-18 ENCOUNTER — Ambulatory Visit (INDEPENDENT_AMBULATORY_CARE_PROVIDER_SITE_OTHER): Admitting: Obstetrics

## 2024-09-18 VITALS — BP 121/63 | HR 94 | Wt 282.0 lb

## 2024-09-18 DIAGNOSIS — O09299 Supervision of pregnancy with other poor reproductive or obstetric history, unspecified trimester: Secondary | ICD-10-CM | POA: Diagnosis not present

## 2024-09-18 DIAGNOSIS — O24313 Unspecified pre-existing diabetes mellitus in pregnancy, third trimester: Secondary | ICD-10-CM | POA: Insufficient documentation

## 2024-09-18 DIAGNOSIS — O99212 Obesity complicating pregnancy, second trimester: Secondary | ICD-10-CM

## 2024-09-18 DIAGNOSIS — O0990 Supervision of high risk pregnancy, unspecified, unspecified trimester: Secondary | ICD-10-CM

## 2024-09-18 DIAGNOSIS — Z794 Long term (current) use of insulin: Secondary | ICD-10-CM

## 2024-09-18 DIAGNOSIS — E669 Obesity, unspecified: Secondary | ICD-10-CM | POA: Diagnosis not present

## 2024-09-18 DIAGNOSIS — Z3689 Encounter for other specified antenatal screening: Secondary | ICD-10-CM

## 2024-09-18 DIAGNOSIS — O24312 Unspecified pre-existing diabetes mellitus in pregnancy, second trimester: Secondary | ICD-10-CM | POA: Diagnosis not present

## 2024-09-18 DIAGNOSIS — Z3A16 16 weeks gestation of pregnancy: Secondary | ICD-10-CM

## 2024-09-18 DIAGNOSIS — O0991 Supervision of high risk pregnancy, unspecified, first trimester: Secondary | ICD-10-CM

## 2024-09-18 DIAGNOSIS — O99210 Obesity complicating pregnancy, unspecified trimester: Secondary | ICD-10-CM | POA: Insufficient documentation

## 2024-09-20 ENCOUNTER — Encounter
Admission: RE | Admit: 2024-09-20 | Discharge: 2024-09-20 | Disposition: A | Discharge status: Home / Self Care | Source: Ambulatory Visit | Attending: *Deleted | Admitting: *Deleted

## 2024-09-20 NOTE — Progress Notes (Signed)
 [facility] Anesthesia Consultation  Robin Houston FMW:[id number] DOB: 02/24/1996 DOA: 09/20/2024 PCP: Patient, No Pcp Per   Requesting physician:  Date of consultation: [id number] Reason for consultation: Obesity during pregnancy  CHIEF COMPLAINT:  Obesity during pregnancy  HISTORY OF PRESENT ILLNESS: Robin Houston  is a 28 y.o. female with a known history of obesity   PAST MEDICAL HISTORY:   Past Medical History:  Diagnosis Date   Anemia    Diabetes mellitus without complication (HCC)    type 2    PAST SURGICAL HISTORY:  Past Surgical History:  Procedure Laterality Date   CESAREAN SECTION  11/04/2021   Procedure: CESAREAN SECTION;  Surgeon: Leonce Garnette BIRCH, MD;  Location: [facility] ORS;  Service: Obstetrics;;    SOCIAL HISTORY:  Social History   Tobacco Use   Smoking status: Never   Smokeless tobacco: Never  Substance Use Topics   Alcohol use: Never    FAMILY HISTORY:  Family History  Problem Relation Age of Onset   Thyroid disease Mother    Diabetes Father    Diabetes Paternal Grandmother     DRUG ALLERGIES: No Known Allergies  REVIEW OF SYSTEMS:   RESPIRATORY: No cough, shortness of breath, wheezing.  CARDIOVASCULAR: No chest pain, orthopnea, edema.  HEMATOLOGY: No anemia, easy bruising or bleeding SKIN: No rash or lesion. NEUROLOGIC: No tingling, numbness, weakness.  PSYCHIATRY: No anxiety or depression.   MEDICATIONS AT HOME:  Prior to Admission medications   Medication Sig Start Date End Date Taking? Authorizing Provider  aspirin  EC 81 MG tablet Take 1 tablet (81 mg total) by mouth daily. Take after 12 weeks for prevention of preeclampssia later in pregnancy 08/25/24   Jayne Harlene CROME, CNM  Continuous Glucose Sensor (DEXCOM G7 SENSOR) MISC 1 Device by Does not apply route every 7 (seven) days. Patient not taking: Reported on 09/18/2024 08/21/24   Jayne Harlene CROME, CNM  ferrous sulfate  325 (65 FE) MG EC  tablet Take 325 mg by mouth 3 (three) times daily with meals.    [provider]  Glucose Blood (BLOOD GLUCOSE TEST STRIPS) STRP 1 each by In Vitro route 4 (four) times daily. May substitute to any manufacturer covered by patient's insurance. To check blood glucose four times daily, fasting & 2h postprandial. 08/21/24 12/19/24  Jayne Harlene CROME, CNM  insulin  glargine (LANTUS ) 100 UNIT/ML Solostar Pen Inject 14 Units into the skin at bedtime. 08/21/24   Jayne Harlene CROME, CNM  Insulin  Pen Needle 32G X 4 MM MISC 1 Device by Does not apply route 4 (four) times daily. 09/04/24   Jayne Harlene CROME, CNM  Lancet Device MISC 1 each by Does not apply route in the morning, at noon, and at bedtime. May substitute to any manufacturer covered by patient's insurance. 08/21/24 09/20/24  Jayne Harlene CROME, CNM  Lancets Misc. MISC 1 each by Does not apply route in the morning, at noon, and at bedtime. May substitute to any manufacturer covered by patient's insurance. To check blood glucose four times daily, fasting & 2h postprandial 08/21/24 09/20/24  Jayne Harlene CROME, CNM  metFORMIN  (GLUCOPHAGE ) 1000 MG tablet TAKE 1 TABLET (1,000 MG TOTAL) BY MOUTH 2 (TWO) TIMES DAILY WITH A MEAL. Patient not taking: Reported on 09/18/2024 07/30/21   Arloa Lamar SQUIBB, MD  Prenatal Vit-Fe Fumarate-FA (PRENATAL PO) Take by mouth.    [provider]      PHYSICAL EXAMINATION:   VITAL SIGNS: Last menstrual period 05/23/2024, unknown if currently breastfeeding.  GENERAL:  28 y.o.-year-old patient no acute distress.  HEENT: Head atraumatic, normocephalic. Oropharynx and nasopharynx clear. MP 2, TM distance >3 cm, normal mouth opening. LUNGS: No use of accessory muscles of respiration.   EXTREMITIES: No pedal edema, cyanosis, or clubbing.  NEUROLOGIC: normal gait PSYCHIATRIC: The patient is alert and oriented x 3.  SKIN: No obvious rash, lesion, or ulcer.    IMPRESSION AND PLAN:   Robin Houston  is a 28  y.o. female presenting with obesity during pregnancy. BMI is currently 45 at [redacted] weeks gestation.   We discussed analgesic options during labor including epidural analgesia. Discussed that in obesity there can be increased difficulty with epidural placement or even failure of successful epidural. We also discussed that even after successful epidural placement there is increased risk of catheter migration out of the epidural space that would require catheter replacement. Discussed use of epidural vs spinal vs GA if cesarean delivery is required. Discussed increased risk of difficult intubation during pregnancy should an emergency cesarean delivery be required.   We discussed repeat evaluation at 35-36 weeks by anesthesia to determine whether there is a high risk of complications of anesthesia for which we would recommend transfer of OB care to a facility with a higher maternal level of care designation.

## 2024-09-21 NOTE — Addendum Note (Signed)
 Addended by: TAFT CAMELIA MATSU on: 09/21/2024 08:45 AM   Modules accepted: Orders

## 2024-10-16 ENCOUNTER — Other Ambulatory Visit

## 2024-10-16 ENCOUNTER — Ambulatory Visit (INDEPENDENT_AMBULATORY_CARE_PROVIDER_SITE_OTHER): Admitting: Certified Nurse Midwife

## 2024-10-16 VITALS — BP 121/78 | HR 101 | Wt 295.8 lb

## 2024-10-16 DIAGNOSIS — E669 Obesity, unspecified: Secondary | ICD-10-CM

## 2024-10-16 DIAGNOSIS — O0990 Supervision of high risk pregnancy, unspecified, unspecified trimester: Secondary | ICD-10-CM

## 2024-10-16 DIAGNOSIS — O24312 Unspecified pre-existing diabetes mellitus in pregnancy, second trimester: Secondary | ICD-10-CM | POA: Diagnosis not present

## 2024-10-16 DIAGNOSIS — O0992 Supervision of high risk pregnancy, unspecified, second trimester: Secondary | ICD-10-CM | POA: Diagnosis not present

## 2024-10-16 DIAGNOSIS — O99212 Obesity complicating pregnancy, second trimester: Secondary | ICD-10-CM

## 2024-10-16 DIAGNOSIS — Z3A20 20 weeks gestation of pregnancy: Secondary | ICD-10-CM

## 2024-10-16 DIAGNOSIS — O99210 Obesity complicating pregnancy, unspecified trimester: Secondary | ICD-10-CM

## 2024-10-16 NOTE — Assessment & Plan Note (Signed)
 Able to view log on phone today. Will upload the images to MyChart. She had 6 elevated fastings out of 30 days. These were after she snacked on carbohydrates the night before. Reviewed eating a high protein, low sugar snack before bed. Her post prandials were better controlled 3 elevated out of 30.  Plan to continue 14u lantus  before bed. Will message us  if she begins to get frequent elevations before her next appointment.  MFM anatomy US  scheduled for 11/19. Will begin growth US  q 4 weeks at 24 weeks.

## 2024-10-16 NOTE — Assessment & Plan Note (Signed)
Reviewed red flag warning signs anticipatory guidance for upcoming prenatal care.

## 2024-10-16 NOTE — Assessment & Plan Note (Signed)
 Met with anesthesia in Oct with a plan to reevaluate 35-36 wks.

## 2024-10-16 NOTE — Progress Notes (Signed)
    Return Prenatal Note   Subjective   28 y.o. [id number] at [redacted]w[redacted]d presents for this follow-up prenatal visit.  Patient is doing well. She reports good fetal movement. She has no new concerns today. Patient reports: Movement: Present Contractions: Not present  Objective   Flow sheet Vitals: Pulse Rate: (!) 101 BP: 121/78 Total weight gain: 35 lb 12.8 oz (16.2 kg)  General Appearance  No acute distress, well appearing, and well nourished Pulmonary   Normal work of breathing Neurologic   Alert and oriented to person, place, and time Psychiatric   Mood and affect within normal limits   Assessment/Plan   Plan  28 y.o. [id number] at [redacted]w[redacted]d presents for follow-up OB visit. Reviewed prenatal record including previous visit note.  Pre-existing diabetes mellitus during pregnancy in second trimester Able to view log on phone today. Will upload the images to MyChart. She had 6 elevated fastings out of 30 days. These were after she snacked on carbohydrates the night before. Reviewed eating a high protein, low sugar snack before bed. Her post prandials were better controlled 3 elevated out of 30.  Plan to continue 14u lantus  before bed. Will message us  if she begins to get frequent elevations before her next appointment.  MFM anatomy US  scheduled for 11/19. Will begin growth US  q 4 weeks at 24 weeks.   Obesity in pregnancy Met with anesthesia in Oct with a plan to reevaluate 35-36 wks.   Supervision of high risk pregnancy, antepartum Reviewed red flag warning signs anticipatory guidance for upcoming prenatal care.        No orders of the defined types were placed in this encounter.  No follow-ups on file.   Future Appointments  Date Time Provider Department Center  10/24/2024  7:45 AM [facility] CONSULT [facility] None  10/24/2024  8:00 AM [facility] US1 [facility]  11/16/2024  3:35 PM Dominic, Jinnie Jansky, CNM [facility] None    For next visit:  continue with routine prenatal  care    Damien Parsley, CNM [facility]

## 2024-10-16 NOTE — Patient Instructions (Signed)
 Alpha-Fetoprotein Test: What to Know Why am I having this test? The alpha-fetoprotein (AFP) test is a blood test used to show if a pregnant person is at risk of carrying a baby with a congenital condition. A congenital condition is a problem that a baby is born with. The AFP test can be combined with other tests to look for these problems in the unborn baby: Genetic problems. Problems with the brain or spinal cord. Belly problems. This test is used to see if your baby is at risk for having a congenital condition. It doesn't show if your baby actually has the condition. More testing will be needed to know for sure. The AFP test may also be done for males or nonpregnant people to check for certain cancers. What is being tested? This test measures the amount of AFP in your blood. AFP is a protein that's normally found in your blood starting in the 10th week of pregnancy. The level of AFP is highest at 16-18 weeks of pregnancy. AFP testing can be done between 15 and 20 weeks of pregnancy, but 16 to 18 weeks of pregnancy is the best time to do it. Certain cancers can cause a high level of AFP in both males and females. What kind of sample is taken?  A blood sample is needed for this test. It's usually taken by putting a needle into a blood vessel. How are the results reported? Your results will be compared with normal results for this test. Each lab has its own range for what's normal. Most lab reports include the normal ranges for each test and a note telling you if yours is high or low. Normal results for the AFP test tend to be: Adult: Less than 40 ng/mL or less than 40 mcg/L (SI units). Child younger than 1 year: Less than 30 ng/mL. If you're pregnant, the values will change based on how far along you are. What do the results mean? If you're pregnant and your results are lower than expected, it can mean that your due date isn't right or that your baby may have trisomy 6, also known as Down  syndrome. If you're pregnant and your results are higher than expected, it may mean: You're pregnant with more than one baby. Your due date is not right. Your baby may have: A problem with the wall of the belly. A problem with the spinal cord or brain. Your baby isn't getting enough oxygen, or the heart rate is too fast or too slow. Your baby has died before being born. Results that are higher than expected in males or nonpregnant females may indicate: Cancer. Liver cell death. Talk with your health care provider about what your results mean. AFP testing is not used alone. Other testing will be needed. Questions to ask your health care provider Ask your provider, or the department that's doing the test: When will my results be ready? How will I get my results? What other tests do I need? What are my next steps? This information is not intended to replace advice given to you by your health care provider. Make sure you discuss any questions you have with your health care provider. Document Revised: 10/05/2023 Document Reviewed: 10/05/2023 Elsevier Patient Education  2025 [employer]. Second Trimester of Pregnancy  The second trimester of pregnancy is from week 14 through week 27. This is months 4 through 6 of pregnancy. During the second trimester: Morning sickness is less or has stopped. You may have more energy. You may  feel hungry more often. At this time, your unborn baby is growing very fast. At the end of the sixth month, the unborn baby may be up to 12 inches long and weigh about 1 pounds. You will likely start to feel the baby move between 16 and 20 weeks of pregnancy. Body changes during your second trimester Your body continues to change during this time. The changes usually go away after your baby is born. Physical changes You will gain more weight. Your belly will get bigger. You may begin to get stretch marks on your hips, belly, and breasts. Your breasts will keep  growing and may hurt. You may get dark spots or blotches on your face. A dark line from your belly button to the pubic area may appear. This line is called linea nigra. Your hair may grow faster and get thicker. Health changes You may have headaches. You may have heartburn. You may pee more often. You may have swollen, bulging veins (varicose veins). You may have trouble pooping (constipation), or swollen veins in the butt that can itch or get painful (hemorrhoids). You may have back pain. This is caused by: Weight gain. Pregnancy hormones that are relaxing the joints in your pelvis. Follow these instructions at home: Medicines Talk to your health care provider if you're taking medicines. Ask if the medicines are safe to take during pregnancy. Your provider may change the medicines that you take. Do not take any medicines unless told to by your provider. Take a prenatal vitamin that has at least 600 micrograms (mcg) of folic acid. Do not use herbal medicines, illegal drugs, or medicines that are not approved by your provider. Eating and drinking While you're pregnant your body needs extra food for your growing baby. Talk with your provider about what to eat while pregnant. Activity Most women are able to exercise during pregnancy. Exercises may need to change as your pregnancy goes on. Talk to your provider about your activities and exercise routines. Relieving pain and discomfort Wear a good, supportive bra if your breasts hurt. Rest with your legs raised if you have leg cramps or low back pain. Take warm sitz baths to soothe pain from hemorrhoids. Use hemorrhoid cream if your provider says it's okay. Do not douche. Do not use tampons or scented pads. Do not use hot tubs, steam rooms, or saunas. Safety Wear your seatbelt at all times when you're in a car. Talk to your provider if someone hits you, hurts you, or yells at you. Talk with your provider if you're feeling sad or have  thoughts of hurting yourself. Lifestyle Certain things can be harmful while you're pregnant. It's best to avoid the following: Do not drink alcohol,smoke, vape, or use products with nicotine  or tobacco in them. If you need help quitting, talk with your provider. Avoid cat litter boxes and soil used by cats. These things carry germs that can cause harm to your pregnancy and your baby. General instructions Keep all follow-up visits. It helps you and your unborn baby stay as healthy as possible. Write down your questions. Take them to your prenatal visits. Your provider will: Talk with you about your overall health. Give you advice or refer you to specialists who can help with different needs, including: Prenatal education classes. Mental health and counseling. Foods and healthy eating. Ask for help if you need help with food. Where to find more information American Pregnancy Association: americanpregnancy.org [employer] of Obstetricians and Gynecologists: acog.org Office on [employer] Health: TravelLesson.ca  Contact a health care provider if: You have a headache that does not go away when you take medicine. You have any of these problems: You can't eat or drink. You throw up or feel like you may throw up. You have watery poop (diarrhea) for 2 days or more. You have pain when you pee or your pee smells bad. You have been sick for 2 days or more and are not getting better. Contact your provider right away if: You have any of these coming from your vagina: Abnormal discharge. Bad-smelling fluid. Bleeding. Your baby is moving less than usual. You have contractions, belly cramping, or have pain in your pelvis or lower back. You have symptoms of high blood pressure or preeclampsia. These include: A severe, throbbing headache that does not go away. Sudden or extreme swelling of your face, hands, legs, or feet. Vision problems: You see spots. You have blurry vision. Your eyes are  sensitive to light. If you can't reach the provider, go to an urgent care or emergency room. Get help right away if: You faint, become confused, or can't think clearly. You have chest pain or trouble breathing. You have any kind of injury, such as from a fall or a car crash. These symptoms may be an emergency. Call 911 right away. Do not wait to see if the symptoms will go away. Do not drive yourself to the hospital. This information is not intended to replace advice given to you by your health care provider. Make sure you discuss any questions you have with your health care provider. Document Revised: 08/25/2023 Document Reviewed: 03/25/2023 Elsevier Patient Education  2024 [employer].

## 2024-10-20 ENCOUNTER — Other Ambulatory Visit: Payer: Self-pay | Admitting: Certified Nurse Midwife

## 2024-10-20 DIAGNOSIS — O0991 Supervision of high risk pregnancy, unspecified, first trimester: Secondary | ICD-10-CM

## 2024-10-24 ENCOUNTER — Ambulatory Visit: Admitting: Maternal & Fetal Medicine

## 2024-10-24 ENCOUNTER — Other Ambulatory Visit: Payer: Self-pay | Admitting: Obstetrics

## 2024-10-24 ENCOUNTER — Ambulatory Visit: Attending: Maternal & Fetal Medicine

## 2024-10-24 VITALS — BP 113/68 | HR 97 | Temp 97.9°F

## 2024-10-24 DIAGNOSIS — O99210 Obesity complicating pregnancy, unspecified trimester: Secondary | ICD-10-CM

## 2024-10-24 DIAGNOSIS — O09292 Supervision of pregnancy with other poor reproductive or obstetric history, second trimester: Secondary | ICD-10-CM | POA: Insufficient documentation

## 2024-10-24 DIAGNOSIS — E669 Obesity, unspecified: Secondary | ICD-10-CM

## 2024-10-24 DIAGNOSIS — Z794 Long term (current) use of insulin: Secondary | ICD-10-CM

## 2024-10-24 DIAGNOSIS — O24312 Unspecified pre-existing diabetes mellitus in pregnancy, second trimester: Secondary | ICD-10-CM

## 2024-10-24 DIAGNOSIS — O99212 Obesity complicating pregnancy, second trimester: Secondary | ICD-10-CM | POA: Diagnosis not present

## 2024-10-24 DIAGNOSIS — O34219 Maternal care for unspecified type scar from previous cesarean delivery: Secondary | ICD-10-CM | POA: Insufficient documentation

## 2024-10-24 DIAGNOSIS — O24112 Pre-existing diabetes mellitus, type 2, in pregnancy, second trimester: Secondary | ICD-10-CM | POA: Insufficient documentation

## 2024-10-24 DIAGNOSIS — O09299 Supervision of pregnancy with other poor reproductive or obstetric history, unspecified trimester: Secondary | ICD-10-CM

## 2024-10-24 DIAGNOSIS — Z363 Encounter for antenatal screening for malformations: Secondary | ICD-10-CM | POA: Insufficient documentation

## 2024-10-24 DIAGNOSIS — E119 Type 2 diabetes mellitus without complications: Secondary | ICD-10-CM

## 2024-10-24 DIAGNOSIS — Z8759 Personal history of other complications of pregnancy, childbirth and the puerperium: Secondary | ICD-10-CM

## 2024-10-24 DIAGNOSIS — Z2839 Other underimmunization status: Secondary | ICD-10-CM

## 2024-10-24 DIAGNOSIS — Z7984 Long term (current) use of oral hypoglycemic drugs: Secondary | ICD-10-CM

## 2024-10-24 DIAGNOSIS — Z3A21 21 weeks gestation of pregnancy: Secondary | ICD-10-CM | POA: Insufficient documentation

## 2024-10-24 NOTE — Progress Notes (Signed)
 MFM Consultation  Ms. Castagnola is a 28 yo G5P1 who is seen today at 30 w 2 d with newly assigned EDD of 03/04/25 based on 9 w US  and not LMP.   She is seen at the request of Estil Mangle, MD  This decision was made as her LMP EDD resulted in fetal growth restriction. Ms. Overholser reports that her periods are regularly irregular ranging from 28 to 30+ days. Today's biometry matched best with the 9 week ultrasound as well as the cerebellum.   She is overall doing well. She denies VB, LOF or uctx.  She had a low risk NIPT (had an initial insufficient fetal DNA first). CBC, CMP, and UPC are normal. TSH, hgbA1c pending.  Ms. Milbourn pregnancy is complicated by:  1) T2 DM.  She is doing well. She reports that her blood sugars have improved are within range since starting Lantus  14u at bedtime. She also remains on Metformin .   Lastly hgb A1c 6.9 09/2021 She is taking low dose ASA for preeclampsia prevention.  2) Elevated BMI >40 kg.m3   3) H/o of preeclampsia with severe features at term. Delivered by LTCS due to failure to dilate. Fetus was LGA EFW > 98%.     10/24/2024    8:02 AM 10/16/2024    2:40 PM 09/18/2024    8:36 AM  Vitals with BMI  Weight  295 lbs 13 oz 282 lbs  Systolic 113 121 878  Diastolic 68 78 63  Pulse 97 101 94   OB History  Gravida Para Term Preterm AB Living  5 1 1  0 3 1  SAB IAB Ectopic Multiple Live Births  3 0 0 0 1    # Outcome Date GA Lbr Len/2nd Weight Sex Type Anes PTL Lv  5 Current           4 SAB 04/20/24 [redacted]w[redacted]d         3 Term 11/04/21 [redacted]w[redacted]d  3660 g M CS-Vac EPI  LIV     Name: DARRIELLE, PFLIEGER     Apgar1: 1  Apgar5: 8  2 SAB 07/22/20          1 SAB 02/2020           Past Medical History:  Diagnosis Date   Anemia    Diabetes mellitus without complication (HCC)    type 2   Past Surgical History:  Procedure Laterality Date   CESAREAN SECTION  11/04/2021   Procedure: CESAREAN SECTION;  Surgeon: Leonce Garnette BIRCH, MD;  Location: [facility] ORS;   Service: Obstetrics;;   Social History   Socioeconomic History   Marital status: Married    Spouse name: Not on file   Number of children: 1   Years of education: Not on file   Highest education level: GED or equivalent  Occupational History   Occupation: Psychologist, Educational for [employer]    Employer: [employer]  Tobacco Use   Smoking status: Never   Smokeless tobacco: Never  Vaping Use   Vaping status: Never Used  Substance and Sexual Activity   Alcohol use: Never   Drug use: Never   Sexual activity: Yes    Partners: Male    Birth control/protection: None  Other Topics Concern   Not on file  Social History Narrative   Not on file   Social Drivers of Health   Financial Resource Strain: Low Risk  (07/11/2024)   Overall Financial Resource Strain (CARDIA)    Difficulty of Paying Living Expenses: Not  very hard  Food Insecurity: No Food Insecurity (07/11/2024)   Hunger Vital Sign    Worried About Running Out of Food in the Last Year: Never true    Ran Out of Food in the Last Year: Never true  Transportation Needs: No Transportation Needs (07/11/2024)   PRAPARE - Administrator, Civil Service (Medical): No    Lack of Transportation (Non-Medical): No  Physical Activity: Insufficiently Active (07/11/2024)   Exercise Vital Sign    Days of Exercise per Week: 4 days    Minutes of Exercise per Session: 10 min  Stress: No Stress Concern Present (07/11/2024)   [employer] of Occupational Health - Occupational Stress Questionnaire    Feeling of Stress: Only a little  Social Connections: Socially Integrated (07/11/2024)   Social Connection and Isolation Panel    Frequency of Communication with Friends and Family: More than three times a week    Frequency of Social Gatherings with Friends and Family: More than three times a week    Attends Religious Services: More than 4 times per year    Active Member of [employer] or Organizations: Yes    Attends Engineer, Structural: More  than 4 times per year    Marital Status: Married  Catering Manager Violence: Not At Risk (07/13/2024)   Humiliation, Afraid, Rape, and Kick questionnaire    Fear of Current or Ex-Partner: No    Emotionally Abused: No    Physically Abused: No    Sexually Abused: No   Current Outpatient Medications on File Prior to Visit  Medication Sig Dispense Refill   aspirin  EC 81 MG tablet Take 1 tablet (81 mg total) by mouth daily. Take after 12 weeks for prevention of preeclampssia later in pregnancy 300 tablet 2   Continuous Glucose Sensor (DEXCOM G7 SENSOR) MISC 1 Device by Does not apply route every 7 (seven) days. (Patient not taking: Reported on 09/18/2024) 4 each 11   ferrous sulfate  325 (65 FE) MG EC tablet Take 325 mg by mouth 3 (three) times daily with meals.     glucose blood (ACCU-CHEK GUIDE TEST) test strip 1 EACH BY IN VITRO ROUTE 4 (FOUR) TIMES DAILY. MAY SUBSTITUTE TO ANY MANUFACTURER COVERED BY PATIENT'S INSURANCE. TO CHECK BLOOD GLUCOSE FOUR TIMES DAILY, FASTING & 2H POSTPRANDIAL. 600 strip 3   insulin  glargine (LANTUS ) 100 UNIT/ML Solostar Pen Inject 14 Units into the skin at bedtime. 15 mL 1   Insulin  Pen Needle 32G X 4 MM MISC 1 Device by Does not apply route 4 (four) times daily. 100 each 6   metFORMIN  (GLUCOPHAGE ) 1000 MG tablet TAKE 1 TABLET (1,000 MG TOTAL) BY MOUTH 2 (TWO) TIMES DAILY WITH A MEAL. (Patient not taking: Reported on 09/18/2024) 180 tablet 3   Prenatal Vit-Fe Fumarate-FA (PRENATAL PO) Take by mouth.     No current facility-administered medications on file prior to visit.   No Known Allergies  Imaging: SIUP with measurements consistent with dates. Good fetal movement and amniotic fluid volume.  Suboptimal views of the fetal anatomy were obtained secondary to fetal position.  Impression/Counseling:  I discussed with Ms. Cameron our changing of her dates to 03/03/25.  Secondly we reviewed the management and complications of T2DM.   We discussed the increased risk  for fetal macrosomia, cesarean delivery, preeclampsia, neonatal hypoglycemia, and postsurgical thrombosis and infection.  We reviewed the goals of FBS < 95 and 2hr pp < 120 mg/dL.   She will continue to titrate her medical  therapy as necessary to maintain > 75% in range blood sugars.   She is currently taking LD ASA for preeclampsia prevention.  A fetal echocardiogram referral was submitted to [facility] cardiology today.   History of preeclampsia vs GHTN  I discussed with Ms. Seals a 25% chance of recurrence. She was diagnosed with preeclampsia with severe features however her BP was 150/90 without additional clinical symptoms. She was placed on Mag SO4 and started on Labetalol , however IV therapy was not required.. Her labs were normal without proteinuria  I discussed that this was likely preeclampsia without severe features. She has normal baseline labs suggesting no additional underlying disease.  Continue routine care Be mindful of s/sx of preeclampsia given that the presentation occurred during delivery.  BMI > 40  I discussed with Ms. Vanhorne the overall increased risk associated with elevated BMI. Most of which is similar to the risk discussed for T2DM.   We discussed the goal of a 11-20lb weight gain.  -Lastly, we recommend serial growth exams every 4 weeks. Initiate 2x weekly testing beginning at 32 weeks -Fetal echocardiogram referral was submitted.   I spent 60 minutes with > 50% in face to face consultation  Nathanel DOROTHA Fetters, MD

## 2024-10-29 ENCOUNTER — Other Ambulatory Visit: Payer: Self-pay

## 2024-10-29 DIAGNOSIS — O24312 Unspecified pre-existing diabetes mellitus in pregnancy, second trimester: Secondary | ICD-10-CM

## 2024-10-29 DIAGNOSIS — O09299 Supervision of pregnancy with other poor reproductive or obstetric history, unspecified trimester: Secondary | ICD-10-CM

## 2024-11-12 ENCOUNTER — Encounter: Payer: Self-pay | Admitting: Certified Nurse Midwife

## 2024-11-15 NOTE — Progress Notes (Unsigned)
° ° °  Return Prenatal Note   Subjective   28 y.o. [id number] at [redacted]w[redacted]d presents for this follow-up prenatal visit.  Patient Here with Dorn Patient reports: dong well overall, no concerns   Movement: Present Contractions: Not present  Objective   Flow sheet Vitals: Pulse Rate: (!) 103 BP: 126/83 Fetal Heart Rate (bpm): 130 Total weight gain: 46 lb 1.6 oz (20.9 kg)  General Appearance  No acute distress, well appearing, and well nourished Pulmonary   Normal work of breathing Neurologic   Alert and oriented to person, place, and time Psychiatric   Mood and affect within normal limits   Assessment/Plan   Plan  28 y.o. [id number] at [redacted]w[redacted]d presents for follow-up OB visit. Reviewed prenatal record including previous visit note.  Pre-existing diabetes mellitus during pregnancy in second trimester -Blood glucose log reveiwed on phone, will upload to my chart  -fasting's 90-95 -B 93-102 -[id number] -[id number] -well controlled no changes to insulin  regimen needed  -US  on 12/29   Supervision of high risk pregnancy, antepartum -Mood can be sensitive overall not concerned  -Not planning to travel  -will need TDAP,  RPR and CBC at next visit -needs to see MD for delivery planning, considering repeat C-section  -warning signs reviewed   Obesity in pregnancy -TWG 46lbs, encouraged to increase physical activity as her diet is good. She believes she gained a lot in her last pregnancy. -BMI now 50, anesthesia consult placed       Orders Placed This Encounter  Procedures   28 Week RH+Panel    Standing Status:   Future    Expected Date:   12/14/2024    Expiration Date:   03/14/2025   RPR W/RFLX TO RPR TITER, TREPONEMAL AB, SCREEN AND DIAGNOSIS    Standing Status:   Future    Expiration Date:   11/16/2025   CBC w/Diff/Platelet    Standing Status:   Future    Expiration Date:   11/16/2025   Return in about 4 weeks (around 12/14/2024) for ROB, 28wks labs.   Future Appointments   Date Time Provider Department Center  12/03/2024 11:00 AM [facility] US  1 [facility] [place]  12/14/2024  3:35 PM Jayne Harlene CROME, CNM [facility]  12/31/2024  2:00 PM [facility] US  1 [facility] [place]    For next visit:  continue with routine prenatal care     JINNIE HERO [facility], CNM  12/12/20255:24 PM

## 2024-11-16 ENCOUNTER — Ambulatory Visit (INDEPENDENT_AMBULATORY_CARE_PROVIDER_SITE_OTHER): Admitting: Licensed Practical Nurse

## 2024-11-16 ENCOUNTER — Encounter: Payer: Self-pay | Admitting: Licensed Practical Nurse

## 2024-11-16 VITALS — BP 126/83 | HR 103 | Wt 306.1 lb

## 2024-11-16 DIAGNOSIS — O24312 Unspecified pre-existing diabetes mellitus in pregnancy, second trimester: Secondary | ICD-10-CM

## 2024-11-16 DIAGNOSIS — Z131 Encounter for screening for diabetes mellitus: Secondary | ICD-10-CM

## 2024-11-16 DIAGNOSIS — O99210 Obesity complicating pregnancy, unspecified trimester: Secondary | ICD-10-CM

## 2024-11-16 DIAGNOSIS — Z113 Encounter for screening for infections with a predominantly sexual mode of transmission: Secondary | ICD-10-CM

## 2024-11-16 DIAGNOSIS — Z3A24 24 weeks gestation of pregnancy: Secondary | ICD-10-CM

## 2024-11-16 DIAGNOSIS — Z1159 Encounter for screening for other viral diseases: Secondary | ICD-10-CM | POA: Diagnosis not present

## 2024-11-16 DIAGNOSIS — Z130 Encounter for screening for diseases of the blood and blood-forming organs and certain disorders involving the immune mechanism: Secondary | ICD-10-CM

## 2024-11-16 DIAGNOSIS — Z114 Encounter for screening for human immunodeficiency virus [HIV]: Secondary | ICD-10-CM

## 2024-11-16 DIAGNOSIS — O0990 Supervision of high risk pregnancy, unspecified, unspecified trimester: Secondary | ICD-10-CM | POA: Diagnosis not present

## 2024-11-16 NOTE — Assessment & Plan Note (Addendum)
-  TWG 46lbs, encouraged to increase physical activity as her diet is good. She believes she gained a lot in her last pregnancy. -BMI now 50, anesthesia consult placed

## 2024-11-16 NOTE — Assessment & Plan Note (Addendum)
-  Blood glucose log reveiwed on phone, will upload to my chart  -fasting's 90-95 -B 93-102 -[id number] -[id number] -well controlled no changes to insulin  regimen needed  -US  on 12/29

## 2024-11-16 NOTE — Patient Instructions (Signed)
 Second Trimester of Pregnancy  The second trimester of pregnancy is from week 14 through week 27. This is months 4 through 6 of pregnancy. During the second trimester: Morning sickness is less or has stopped. You may have more energy. You may feel hungry more often. At this time, your unborn baby is growing very fast. At the end of the sixth month, the unborn baby may be up to 12 inches long and weigh about 1 pounds. You will likely start to feel the baby move between 16 and 20 weeks of pregnancy. Body changes during your second trimester Your body continues to change during this time. The changes usually go away after your baby is born. Physical changes You will gain more weight. Your belly will get bigger. You may begin to get stretch marks on your hips, belly, and breasts. Your breasts will keep growing and may hurt. You may get dark spots or blotches on your face. A dark line from your belly button to the pubic area may appear. This line is called linea nigra. Your hair may grow faster and get thicker. Health changes You may have headaches. You may have heartburn. You may pee more often. You may have swollen, bulging veins (varicose veins). You may have trouble pooping (constipation), or swollen veins in the butt that can itch or get painful (hemorrhoids). You may have back pain. This is caused by: Weight gain. Pregnancy hormones that are relaxing the joints in your pelvis. Follow these instructions at home: Medicines Talk to your health care provider if you're taking medicines. Ask if the medicines are safe to take during pregnancy. Your provider may change the medicines that you take. Do not take any medicines unless told to by your provider. Take a prenatal vitamin that has at least 600 micrograms (mcg) of folic acid . Do not use herbal medicines, illegal drugs, or medicines that are not approved by your provider. Eating and drinking While you're pregnant your body needs  extra food for your growing baby. Talk with your provider about what to eat while pregnant. Activity Most women are able to exercise during pregnancy. Exercises may need to change as your pregnancy goes on. Talk to your provider about your activities and exercise routines. Relieving pain and discomfort Wear a good, supportive bra if your breasts hurt. Rest with your legs raised if you have leg cramps or low back pain. Take warm sitz baths to soothe pain from hemorrhoids. Use hemorrhoid cream if your provider says it's okay. Do not douche. Do not use tampons or scented pads. Do not use hot tubs, steam rooms, or saunas. Safety Wear your seatbelt at all times when you're in a car. Talk to your provider if someone hits you, hurts you, or yells at you. Talk with your provider if you're feeling sad or have thoughts of hurting yourself. Lifestyle Certain things can be harmful while you're pregnant. It's best to avoid the following: Do not drink alcohol,smoke, vape, or use products with nicotine  or tobacco in them. If you need help quitting, talk with your provider. Avoid cat litter boxes and soil used by cats. These things carry germs that can cause harm to your pregnancy and your baby. General instructions Keep all follow-up visits. It helps you and your unborn baby stay as healthy as possible. Write down your questions. Take them to your prenatal visits. Your provider will: Talk with you about your overall health. Give you advice or refer you to specialists who can help with different needs,  including: Prenatal education classes. Mental health and counseling. Foods and healthy eating. Ask for help if you need help with food. Where to find more information American Pregnancy Association: americanpregnancy.org [employer]: acog.org Office on [employer] Health: travellesson.ca Contact a health care provider if: You have a headache that does not go away  when you take medicine. You have any of these problems: You can't eat or drink. You throw up or feel like you may throw up. You have watery poop (diarrhea) for 2 days or more. You have pain when you pee or your pee smells bad. You have been sick for 2 days or more and are not getting better. Contact your provider right away if: You have any of these coming from your vagina: Abnormal discharge. Bad-smelling fluid. Bleeding. Your baby is moving less than usual. You have contractions, belly cramping, or have pain in your pelvis or lower back. You have symptoms of high blood pressure or preeclampsia. These include: A severe, throbbing headache that does not go away. Sudden or extreme swelling of your face, hands, legs, or feet. Vision problems: You see spots. You have blurry vision. Your eyes are sensitive to light. If you can't reach the provider, go to an urgent care or emergency room. Get help right away if: You faint, become confused, or can't think clearly. You have chest pain or trouble breathing. You have any kind of injury, such as from a fall or a car crash. These symptoms may be an emergency. Call 911 right away. Do not wait to see if the symptoms will go away. Do not drive yourself to the hospital. This information is not intended to replace advice given to you by your health care provider. Make sure you discuss any questions you have with your health care provider. Document Revised: 08/25/2023 Document Reviewed: 03/25/2023 Elsevier Patient Education  2024 Elsevier Inc. Why am I having this test? The oral glucose tolerance test (OGTT) is done to check how your body uses blood sugar, also called glucose. It's one of many tests used to diagnose the type of diabetes you can get while pregnant. This type of diabetes is called gestational diabetes mellitus (GDM). You may get GDM during the middle part of your pregnancy. In most cases, it goes away after you give birth. Most  people get tested for GDM around weeks 24-28 of pregnancy. You may have the test sooner if: You or your mother had diabetes while pregnant. A person in your family has diabetes. You're having more than one baby this pregnancy. You've had a baby before who weighed more than 9 pounds (4 kg) at birth. You have high blood pressure or heart disease. You have a large body. You're not active. What is being tested? This test measures your blood sugar at different times. It shows how well your body uses the sugar in your blood. What kind of sample is taken?  A sample of blood is needed for this test. The sample is taken by putting a needle into a blood vessel. How do I prepare for this test? Eat your normal meals the day before the test. Your health care provider will tell you about: Eating or drinking on the day of the test. You may need to fast for 8-10 hours before the test. When you fast, you can only have water. Changing or stopping your regular medicines. Some medicines may affect your test results. Tell a health care provider about: All medicines you take. These include  vitamins, herbs, eye drops, and creams. What happens during the test? The test involves these steps: Your blood sugar will be checked. It's called your fasting blood sugar if you fasted before the test. You'll drink a sugary mixture. Your blood sugar will be checked again. For a 1-hour test, it will be checked after an hour. For a 3-hour test, it will be checked 1, 2, and 3 hours after you drink the sugary mixture. The test takes 1-3 hours. You'll need to stay at the testing place during this time. During the testing time: Do not eat or drink anything after the sugary drink. Do not exercise. Do not use any products that contain nicotine  or tobacco. These products include cigarettes, chewing tobacco, and vaping devices, such as e-cigarettes. The test may vary among providers and hospitals. How are the results  reported? Your provider will compare your results to normal values for the kind of test that you had done. You may need to call or meet with your provider to get your results. What do the results mean? Your provider can tell you what blood sugar levels are normal for the test you're doing. If two or more of your blood sugar levels are at or above normal, you may be diagnosed with GDM. If only one level is high, your provider may suggest: Doing the test again. Doing other tests to confirm a diagnosis. Talk with your provider about what your results mean. Questions to ask your health care provider Ask your provider, or the department doing the test: When will my results be ready? How will I get my results? What are my next steps? This information is not intended to replace advice given to you by your health care provider. Make sure you discuss any questions you have with your health care provider. Document Revised: 03/28/2023 Document Reviewed: 03/28/2023 Elsevier Patient Education  2024 [employer].

## 2024-11-16 NOTE — Assessment & Plan Note (Addendum)
-  Mood can be sensitive overall not concerned  -Not planning to travel  -will need TDAP,  RPR and CBC at next visit -needs to see MD for delivery planning, considering repeat C-section  -warning signs reviewed

## 2024-12-01 ENCOUNTER — Encounter: Payer: Self-pay | Admitting: Obstetrics

## 2024-12-01 ENCOUNTER — Observation Stay
Admission: EM | Admit: 2024-12-01 | Discharge: 2024-12-02 | Disposition: A | Attending: Obstetrics | Admitting: Obstetrics

## 2024-12-01 DIAGNOSIS — R0789 Other chest pain: Secondary | ICD-10-CM | POA: Diagnosis not present

## 2024-12-01 DIAGNOSIS — O26892 Other specified pregnancy related conditions, second trimester: Secondary | ICD-10-CM | POA: Diagnosis present

## 2024-12-01 DIAGNOSIS — O368120 Decreased fetal movements, second trimester, not applicable or unspecified: Secondary | ICD-10-CM | POA: Diagnosis not present

## 2024-12-01 DIAGNOSIS — Z3490 Encounter for supervision of normal pregnancy, unspecified, unspecified trimester: Principal | ICD-10-CM

## 2024-12-01 DIAGNOSIS — Z3A27 27 weeks gestation of pregnancy: Secondary | ICD-10-CM | POA: Insufficient documentation

## 2024-12-01 MED ORDER — ACETAMINOPHEN 500 MG PO TABS
1000.0000 mg | ORAL_TABLET | Freq: Four times a day (QID) | ORAL | Status: DC | PRN
  Administered 2024-12-01: 1000 mg via ORAL
  Filled 2024-12-01: qty 2

## 2024-12-01 MED ORDER — SIMETHICONE 80 MG PO CHEW: 80.0000 mg | CHEWABLE_TABLET | Freq: Four times a day (QID) | ORAL | Status: DC | PRN

## 2024-12-01 MED ORDER — CALCIUM CARBONATE ANTACID 500 MG PO CHEW: 2.0000 | CHEWABLE_TABLET | ORAL | Status: DC | PRN

## 2024-12-01 NOTE — OB Triage Note (Signed)
 Pt Robin Houston 28 y.o. presents to labor and delivery triage reporting constant pain under her right rib and decreased fetal movement over the last couple of hours.  Pt is a G5P1031 at [redacted]w[redacted]d . Pt denies signs and symptons consistent with rupture of membranes or active vaginal bleeding. Pt denies contractions External FM  and TOCO applied to non-tender abdomen and assessing. Initial FHR 150 . Vital signs obtained and within normal limits. Provider notified of pt.

## 2024-12-02 DIAGNOSIS — O368120 Decreased fetal movements, second trimester, not applicable or unspecified: Secondary | ICD-10-CM

## 2024-12-02 DIAGNOSIS — O26892 Other specified pregnancy related conditions, second trimester: Secondary | ICD-10-CM | POA: Diagnosis not present

## 2024-12-02 DIAGNOSIS — Z3A27 27 weeks gestation of pregnancy: Secondary | ICD-10-CM

## 2024-12-02 DIAGNOSIS — M791 Myalgia, unspecified site: Secondary | ICD-10-CM | POA: Diagnosis not present

## 2024-12-02 NOTE — OB Triage Note (Signed)
 Patients rib pain was relieved by acetaminophen , fetal heart tracing reassuring. CNM at bedside to assess patient. Patient cleared for discharge. Discharge instructions, labor precautions, and follow-up care reviewed with patient and significant other. All questions answered. Patient verbalized understanding. Discharged ambulatory off unit.

## 2024-12-02 NOTE — OB Triage Note (Signed)
 LABOR & DELIVERY OB TRIAGE NOTE  SUBJECTIVE  HPI Robin Houston is a 28 y.o. G5P1031 at [redacted]w[redacted]d who presents to Labor & Delivery for rib pain and decreased fetal movement. She reports that she has had constant pain under her right rib for several hours. She denies injury or trauma but has been active with her toddler. She did not try any comfort measures at home. She denies ctx, LOF, and vaginal bleeding. She has a h/o preeclampsia in a previous pregnancy but is currently normotensive. In triage, Tylenol  relieved her pain. She began feeling fetal movement, as well. Palpable fetal movements  noted.  OB History     Gravida  5   Para  1   Term  1   Preterm  0   AB  3   Living  1      SAB  3   IAB  0   Ectopic  0   Multiple  0   Live Births  1           Scheduled Meds: Continuous Infusions: PRN Meds:.acetaminophen , calcium  carbonate, simethicone   OBJECTIVE  BP (!) 99/45   Pulse (!) 103   Temp 98.2 F (36.8 C) (Oral)   Resp 14   Ht 5' 5 (1.651 m)   Wt (!) 138.8 kg   LMP 05/23/2024 (Exact Date)   BMI 50.92 kg/m   General: alert, cooperative, NAD Abdomen: soft, gravid, non-tender Cervical exam: deferred  NST Baseline: 140 Variability: moderate Accels: 10x10 Decels: None Difficulty maintaining continuous tracing d/t fetal movement and maternal habitus. Tracing overall reassuring for gestational age, and fetal movements were palpated.   ASSESSMENT Impression  1) Pregnancy at [id number], [redacted]w[redacted]d, Estimated Date of Delivery: 03/03/25 2) Reassuring maternal/fetal status 3) Musculoskeletal pain  PLAN 1) Discharge home with standard labor/return precautions 2) Keep scheduled ROB appt 3) Tylenol , rest, heat/ice, warm bath  Eleanor Canny, CNM 12/02/2024  12:46 AM

## 2024-12-03 ENCOUNTER — Ambulatory Visit

## 2024-12-03 ENCOUNTER — Other Ambulatory Visit: Payer: Self-pay

## 2024-12-03 ENCOUNTER — Telehealth: Payer: Self-pay

## 2024-12-03 ENCOUNTER — Other Ambulatory Visit

## 2024-12-03 VITALS — BP 127/81 | HR 105

## 2024-12-03 DIAGNOSIS — O24312 Unspecified pre-existing diabetes mellitus in pregnancy, second trimester: Secondary | ICD-10-CM

## 2024-12-03 DIAGNOSIS — O09299 Supervision of pregnancy with other poor reproductive or obstetric history, unspecified trimester: Secondary | ICD-10-CM

## 2024-12-03 DIAGNOSIS — Z3490 Encounter for supervision of normal pregnancy, unspecified, unspecified trimester: Secondary | ICD-10-CM

## 2024-12-03 DIAGNOSIS — Z2839 Other underimmunization status: Secondary | ICD-10-CM

## 2024-12-03 DIAGNOSIS — O99210 Obesity complicating pregnancy, unspecified trimester: Secondary | ICD-10-CM

## 2024-12-03 NOTE — Telephone Encounter (Signed)
 Chart reviewed. Patient seen on L&D as advised.

## 2024-12-03 NOTE — Telephone Encounter (Signed)
 Robin Houston

## 2024-12-14 ENCOUNTER — Ambulatory Visit: Payer: Self-pay | Admitting: Certified Nurse Midwife

## 2024-12-14 VITALS — BP 114/77 | HR 99 | Wt 312.4 lb

## 2024-12-14 DIAGNOSIS — O09293 Supervision of pregnancy with other poor reproductive or obstetric history, third trimester: Secondary | ICD-10-CM | POA: Diagnosis not present

## 2024-12-14 DIAGNOSIS — O24313 Unspecified pre-existing diabetes mellitus in pregnancy, third trimester: Secondary | ICD-10-CM

## 2024-12-14 DIAGNOSIS — Z23 Encounter for immunization: Secondary | ICD-10-CM | POA: Diagnosis not present

## 2024-12-14 DIAGNOSIS — Z3483 Encounter for supervision of other normal pregnancy, third trimester: Secondary | ICD-10-CM

## 2024-12-14 DIAGNOSIS — Z3A28 28 weeks gestation of pregnancy: Secondary | ICD-10-CM

## 2024-12-14 DIAGNOSIS — O0990 Supervision of high risk pregnancy, unspecified, unspecified trimester: Secondary | ICD-10-CM

## 2024-12-14 DIAGNOSIS — O0991 Supervision of high risk pregnancy, unspecified, first trimester: Secondary | ICD-10-CM

## 2024-12-14 DIAGNOSIS — Z369 Encounter for antenatal screening, unspecified: Secondary | ICD-10-CM

## 2024-12-14 DIAGNOSIS — Z113 Encounter for screening for infections with a predominantly sexual mode of transmission: Secondary | ICD-10-CM

## 2024-12-14 DIAGNOSIS — Z114 Encounter for screening for human immunodeficiency virus [HIV]: Secondary | ICD-10-CM

## 2024-12-14 DIAGNOSIS — O09299 Supervision of pregnancy with other poor reproductive or obstetric history, unspecified trimester: Secondary | ICD-10-CM

## 2024-12-14 MED ORDER — INSULIN GLARGINE 100 UNIT/ML SOLOSTAR PEN: 16.0000 [IU] | PEN_INJECTOR | Freq: Every day | SUBCUTANEOUS | 1 refills | Status: DC

## 2024-12-14 NOTE — Patient Instructions (Signed)
 Tdap (Tetanus, Diphtheria, Pertussis) Vaccine: What You Need to Know Many vaccine information statements are available in Spanish and other languages. See PromoAge.com.br. 1. Why get vaccinated? Tdap vaccine can prevent tetanus, diphtheria, and pertussis. Diphtheria and pertussis spread from person to person. Tetanus enters the body through cuts or wounds. TETANUS (T) causes painful stiffening of the muscles. Tetanus can lead to serious health problems, including being unable to open the mouth, having trouble swallowing and breathing, or death. DIPHTHERIA (D) can lead to difficulty breathing, heart failure, paralysis, or death. PERTUSSIS (aP), also known as "whooping cough," can cause uncontrollable, violent coughing that makes it hard to breathe, eat, or drink. Pertussis can be extremely serious especially in babies and young children, causing pneumonia, convulsions, brain damage, or death. In teens and adults, it can cause weight loss, loss of bladder control, passing out, and rib fractures from severe coughing. 2. Tdap vaccine Tdap is only for children 7 years and older, adolescents, and adults.  Adolescents should receive a single dose of Tdap, preferably at age 1 or 12 years. Pregnant people should get a dose of Tdap during every pregnancy, preferably during the early part of the third trimester, to help protect the newborn from pertussis. Infants are most at risk for severe, life-threatening complications from pertussis. Adults who have never received Tdap should get a dose of Tdap. Also, adults should receive a booster dose of either Tdap or Td (a different vaccine that protects against tetanus and diphtheria but not pertussis) every 10 years, or after 5 years in the case of a severe or dirty wound or burn. Tdap may be given at the same time as other vaccines. 3. Talk with your health care provider Tell your vaccine provider if the person getting the vaccine: Has had an allergic  reaction after a previous dose of any vaccine that protects against tetanus, diphtheria, or pertussis, or has any severe, life-threatening allergies Has had a coma, decreased level of consciousness, or prolonged seizures within 7 days after a previous dose of any pertussis vaccine (DTP, DTaP, or Tdap) Has seizures or another nervous system problem Has ever had Guillain-Barr Syndrome (also called "GBS") Has had severe pain or swelling after a previous dose of any vaccine that protects against tetanus or diphtheria In some cases, your health care provider may decide to postpone Tdap vaccination until a future visit. People with minor illnesses, such as a cold, may be vaccinated. People who are moderately or severely ill should usually wait until they recover before getting Tdap vaccine.  Your health care provider can give you more information. 4. Risks of a vaccine reaction Pain, redness, or swelling where the shot was given, mild fever, headache, feeling tired, and nausea, vomiting, diarrhea, or stomachache sometimes happen after Tdap vaccination. People sometimes faint after medical procedures, including vaccination. Tell your provider if you feel dizzy or have vision changes or ringing in the ears.  As with any medicine, there is a very remote chance of a vaccine causing a severe allergic reaction, other serious injury, or death. 5. What if there is a serious problem? An allergic reaction could occur after the vaccinated person leaves the clinic. If you see signs of a severe allergic reaction (hives, swelling of the face and throat, difficulty breathing, a fast heartbeat, dizziness, or weakness), call 9-1-1 and get the person to the nearest hospital. For other signs that concern you, call your health care provider.  Adverse reactions should be reported to the Vaccine Adverse Event Reporting  System (VAERS). Your health care provider will usually file this report, or you can do it yourself. Visit the  VAERS website at www.vaers.LAgents.no or call 581-173-2486. VAERS is only for reporting reactions, and VAERS staff members do not give medical advice. 6. The National Vaccine Injury Compensation Program The [employer] Vaccine Injury Compensation Program (VICP) is a federal program that was created to compensate people who may have been injured by certain vaccines. Claims regarding alleged injury or death due to vaccination have a time limit for filing, which may be as short as two years. Visit the VICP website at SpiritualWord.at or call 807-029-5188 to learn about the program and about filing a claim. 7. How can I learn more? Ask your health care provider. Call your local or state health department. Visit the website of the Food and Drug Administration (FDA) for vaccine package inserts and additional information at FinderList.no. Contact the Centers for Disease Control and Prevention (CDC): Call 873-355-8522 (1-800-CDC-INFO) or Visit CDC's website at PicCapture.uy. Source: CDC Vaccine Information Statement Tdap (Tetanus, Diphtheria, Pertussis) Vaccine (07/11/2020) This same material is available at FootballExhibition.com.br for no charge. This information is not intended to replace advice given to you by your health care provider. Make sure you discuss any questions you have with your health care provider. Document Revised: 03/09/2023 Document Reviewed: 01/07/2023 Elsevier Patient Education  2024 [employer].   Third Trimester of Pregnancy  The third trimester of pregnancy is from week 28 through week 40. This is months 7 through 9. The third trimester is a time when your baby is growing fast. Body changes during your third trimester Your body continues to change during this time. The changes usually go away after your baby is born. Physical changes You will continue to gain weight. You may get stretch marks on your hips, belly, and breasts. Your  breasts will keep growing and may hurt. A yellow fluid (colostrum) may leak from your breasts. This is the first milk you're making for your baby. Your hair may grow faster and get thicker. In some cases, you may get hair loss. Your belly button may stick out. You may have more swelling in your hands, face, or ankles. Health changes You may have heartburn. You may feel short of breath. This is caused by the uterus that is now bigger. You may have more aches in the pelvis, back, or thighs. You may have more tingling or numbness in your hands, arms, and legs. You may pee more often. You may have trouble pooping (constipation) or swollen veins in the butt that can itch or get painful (hemorrhoids). Other changes You may have more problems sleeping. You may notice the baby moving lower in your belly (dropping). You may have more fluid coming from your vagina. Your joints may feel loose, and you may have pain around your pelvic bone. Follow these instructions at home: Medicines Take medicines only as told by your health care provider. Some medicines are not safe during pregnancy. Your provider may change the medicines that you take. Do not take any medicines unless told to by your provider. Take a prenatal vitamin that has at least 600 micrograms (mcg) of folic acid. Do not use herbal medicines, illegal drugs, or medicines that are not approved by your provider. Eating and drinking While you're pregnant your body needs additional nutrition to help support your growing baby. Talk with your provider about your nutritional needs. Activity Most women are able to exercise regularly during pregnancy. Exercise routines may need to  change at the end of your pregnancy. Talk to your provider about your activities and exercise routine. Relieving pain and discomfort Rest often with your legs raised if you have leg cramps or low back pain. Take warm sitz baths to soothe pain from hemorrhoids. Use  hemorrhoid cream if your provider says it's okay. Wear a good, supportive bra if your breasts hurt. Do not use hot tubs, steam rooms, or saunas. Do not douche. Do not use tampons or scented pads. Safety Talk to your provider before traveling far distances. Wear your seatbelt at all times when you're in a car. Talk to your provider if someone hits you, hurts you, or yells at you. Preparing for birth To prepare for your baby: Take childbirth and breastfeeding classes. Visit the hospital and tour the maternity area. Buy a rear-facing car seat. Learn how to install it in your car. General instructions Avoid cat litter boxes and soil used by cats. These things carry germs that can cause harm to your pregnancy and your baby. Do not drink alcohol, smoke, vape, or use products with nicotine or tobacco in them. If you need help quitting, talk with your provider. Keep all follow-up visits for your third trimester. Your provider will do more exams and tests during this trimester. Write down your questions. Take them to your prenatal visits. Your provider also will: Talk with you about your overall health. Give you advice or refer you to specialists who can help with different needs, including: Mental health and counseling. Foods and healthy eating. Ask for help if you need help with food. Where to find more information American Pregnancy Association: americanpregnancy.org [employer]: acog.org Office on [employer] Health: TravelLesson.ca Contact a health care provider if: You have a headache that does not go away when you take medicine. You have any of these problems: You can't eat or drink. You have nausea and vomiting. You have watery poop (diarrhea) for 2 days or more. You have pain when you pee, or your pee smells bad. You have been sick for 2 days or more and aren't getting better. Contact your provider right away if: You have any of these coming from  your vagina: Abnormal discharge. Bad-smelling fluid. Bleeding. Your baby is moving less than usual. You have signs of labor: You have any contractions, belly cramping, or have pain in your pelvis or lower back before 37 weeks of pregnancy (preterm labor). You have regular contractions that are less than 5 minutes apart. Your water breaks. You have symptoms of high blood pressure or preeclampsia. These include: A severe, throbbing headache that does not go away. Sudden or extreme swelling of your face, hands, legs, or feet. Vision problems: You see spots. You have blurry vision. Your eyes are sensitive to light. If you can't reach your provider, go to an urgent care or emergency room. Get help right away if: You faint, become confused, or can't think clearly. You have chest pain or trouble breathing. You have any kind of injury, such as from a fall or a car crash. These symptoms may be an emergency. Call 911 right away. Do not wait to see if the symptoms will go away. Do not drive yourself to the hospital. This information is not intended to replace advice given to you by your health care provider. Make sure you discuss any questions you have with your health care provider. Document Revised: 08/25/2023 Document Reviewed: 03/25/2023 Elsevier Patient Education  2024 [employer].

## 2024-12-14 NOTE — Progress Notes (Unsigned)
" ° ° °  Return Prenatal Note   Subjective   29 y.o. [id number] at [redacted]w[redacted]d presents for this follow-up prenatal visit.  Patient feeling well, active baby. Has decided she would like to schedule repeat Cesarean birth. Patient reports: Movement: Present Contractions: Not present  Objective   Flow sheet Vitals: Pulse Rate: 99 BP: 114/77 Fetal Heart Rate (bpm): 140 Total weight gain: 52 lb 6.4 oz (23.8 kg)  General Appearance  No acute distress, well appearing, and well nourished Pulmonary   Normal work of breathing Neurologic   Alert and oriented to person, place, and time Psychiatric   Mood and affect within normal limits  Date Fasting Breakfast Lunch Dinner  12/25 97 108 112 118  12/26 93 102 110 119  12/27 95 105 113 117  12/28 98 102 109 117  12/29 96 108 112 115  12/30 99 105 116 120  12/31 96 102 110 118  12/06/24 94 100 106 115  12/07/24 94 104 108 116  12/08/24 94 103 109 113  12/09/24 95 102 110 115  12/10/24 94 107 112 118  12/11/24 95 103 108 115  12/12/24 93 100 109 118  Blood sugars over last two weeks reviewed, 8/14 fastings elevated. All but one postprandial <120. Assessment/Plan   Plan  29 y.o. [id number] at [redacted]w[redacted]d presents for follow-up OB visit. Reviewed prenatal record including previous visit note.  Preexisting diabetes complicating pregnancy, antepartum, third trimester Increase lantus  to 16 units at bedtime given >50% fastings elevated. Next growth 1/26.  History of pre-eclampsia in prior pregnancy, currently pregnant CMP, UP/C in addition to CBC drawn today. Remains normotensive. Precautions reviewed.  Supervision of high risk pregnancy, antepartum Reviewed kick counts and preterm labor warning signs. Instructed to call office or come to hospital with persistent headache, vision changes, regular contractions, leaking of fluid, decreased fetal movement or vaginal bleeding. Needs visit with Dr. Leigh or Starla for delivery planning, desires rCD. Reviewed likelihood of 37-38w  delivery given insulin  dependent diabetes.      Orders Placed This Encounter  Procedures   Tdap vaccine greater than or equal to 7yo IM   CBC   Comprehensive metabolic panel with GFR   Protein / creatinine ratio, urine   RPR W/RFLX TO RPR TITER, TREPONEMAL AB, SCREEN AND DIAGNOSIS   HIV Antibody (routine testing w rflx)   Return in about 2 weeks (around 12/28/2024) for ROB.   Future Appointments  Date Time Provider Department Center  12/21/2024  9:00 AM [facility] None  12/31/2024  2:00 PM [facility] [facility]  1 [facility] [place]  01/07/2025  2:00 PM [facility] NST [facility] [place]  01/14/2025  2:00 PM [facility] [place]  01/21/2025  2:00 PM [facility] NST [facility] [place]  01/28/2025  2:00 PM [facility] [facility]  1 [facility] [place]    For next visit:  continue with routine prenatal care, schedule MD/DO visit for delivery planning     Harlene LITTIE Cisco, CNM  1/9/20264:59 PM  "

## 2024-12-15 LAB — COMPREHENSIVE METABOLIC PANEL WITH GFR
ALT: 11 IU/L (ref 0–32)
AST: 9 IU/L (ref 0–40)
Albumin: 3.5 g/dL — ABNORMAL LOW (ref 4.0–5.0)
Alkaline Phosphatase: 63 IU/L (ref 41–116)
BUN/Creatinine Ratio: 16 (ref 9–23)
BUN: 9 mg/dL (ref 6–20)
Bilirubin Total: <0.2 mg/dL (ref 0.0–1.2)
CO2: 18 mmol/L — ABNORMAL LOW (ref 20–29)
Calcium: 9 mg/dL (ref 8.7–10.2)
Chloride: 100 mmol/L (ref 96–106)
Creatinine, Ser: 0.56 mg/dL — ABNORMAL LOW (ref 0.57–1.00)
Globulin, Total: 2.9 g/dL (ref 1.5–4.5)
Glucose: 216 mg/dL — ABNORMAL HIGH (ref 70–99)
Potassium: 4.3 mmol/L (ref 3.5–5.2)
Sodium: 134 mmol/L (ref 134–144)
Total Protein: 6.4 g/dL (ref 6.0–8.5)
eGFR: 127 mL/min/1.73

## 2024-12-15 LAB — PROTEIN / CREATININE RATIO, URINE
Creatinine, Urine: 79.9 mg/dL
Protein, Ur: 11.7 mg/dL
Protein/Creat Ratio: 146 mg/g{creat} (ref 0–200)

## 2024-12-15 LAB — CBC
Hematocrit: 40.1 % (ref 34.0–46.6)
Hemoglobin: 12.8 g/dL (ref 11.1–15.9)
MCH: 26.8 pg (ref 26.6–33.0)
MCHC: 31.9 g/dL (ref 31.5–35.7)
MCV: 84 fL (ref 79–97)
Platelets: 269 x10E3/uL (ref 150–450)
RBC: 4.77 x10E6/uL (ref 3.77–5.28)
RDW: 14.6 % (ref 11.7–15.4)
WBC: 8.4 x10E3/uL (ref 3.4–10.8)

## 2024-12-15 LAB — SYPHILIS: RPR W/REFLEX TO RPR TITER AND TREPONEMAL ANTIBODIES, TRADITIONAL SCREENING AND DIAGNOSIS ALGORITHM: RPR Ser Ql: NONREACTIVE

## 2024-12-15 LAB — HIV ANTIBODY (ROUTINE TESTING W REFLEX): HIV Screen 4th Generation wRfx: NONREACTIVE

## 2024-12-16 NOTE — Assessment & Plan Note (Signed)
 Reviewed kick counts and preterm labor warning signs. Instructed to call office or come to hospital with persistent headache, vision changes, regular contractions, leaking of fluid, decreased fetal movement or vaginal bleeding. Needs visit with Dr. Leigh or Starla for delivery planning, desires rCD. Reviewed likelihood of 37-38w delivery given insulin  dependent diabetes.

## 2024-12-16 NOTE — Assessment & Plan Note (Signed)
 CMP, UP/C in addition to CBC drawn today. Remains normotensive. Precautions reviewed.

## 2024-12-16 NOTE — Assessment & Plan Note (Signed)
 Increase lantus  to 16 units at bedtime given >50% fastings elevated. Next growth 1/26.

## 2024-12-19 ENCOUNTER — Encounter: Payer: Self-pay | Admitting: Certified Nurse Midwife

## 2024-12-21 ENCOUNTER — Other Ambulatory Visit: Payer: Self-pay

## 2024-12-21 ENCOUNTER — Ambulatory Visit: Payer: Self-pay | Admitting: Certified Nurse Midwife

## 2024-12-25 NOTE — Progress Notes (Unsigned)
" ° ° °  Return Prenatal Note   Subjective   29 y.o. Robin Houston at [redacted]w[redacted]d presents for this follow-up prenatal visit.  Patient feeling well, active baby. Post meal sugars overall normal, continues to have elevated fastings. Reviewed timing of delivery likely between 37-38w, desires rCD. Patient reports: Movement: Present Contractions: Not present Date Fasting Breakfast Lunch Dinner  1/10 100 105 110 116  1/11 96 104 113 117  1/12 98 105 112 123  1/13 94 110 118 120  1/14 95 105 117 120  1/15 96 105 114 118  1/16 96 106 112 119   Date Fasting Breakfast Lunch Dinner  1/17 96 102 116 120  1/18 95 100 117 120  1/19 96 101 113 119  1/20 97 105 114 119  1/21 98 102 108 116  1/22 94 104 109 118  1/23 96 102      Highest /lowest fasting:100/94 2 hour post prandial Highest/lowest Breakfast:110/100 Highest lowest Lunch:118/108 Highest/lowest Dinner:123/116  Objective   Flow sheet Vitals: Pulse Rate: (!) 107 BP: 108/71 Fetal Heart Rate (bpm): 145 Total weight gain: 53 lb 4.8 oz (24.2 kg)  General Appearance  No acute distress, well appearing, and well nourished Pulmonary   Normal work of breathing Neurologic   Alert and oriented to person, place, and time Psychiatric   Mood and affect within normal limits   Assessment/Plan   Plan  29 y.o. Robin Houston at [redacted]w[redacted]d presents for follow-up OB visit. Reviewed prenatal record including previous visit note.  Preexisting diabetes complicating pregnancy, antepartum, third trimester Insulin  again increased due to >50% fastings elevated. Now at 18u Lantus  at bedtime. MFM appointment for growth 1/26. Timing of delivery dependent on glucose control.  Supervision of high risk pregnancy, antepartum Red flag warning signs, how to contact provider reviewed. Start antenatal testing at 32w. Needs visit with physician for CD planning.      No orders of the defined types were placed in this encounter.  Return in 2 weeks (on 01/11/2025) for ROB & NST.    Future Appointments  Date Time Provider Department Center  12/31/2024  2:00 PM [facility] [facility]  1 [facility] [place]  01/07/2025  2:00 PM [facility] N[facility] [place]  01/14/2025  2:00 PM [facility] [place]  01/15/2025  2:55 PM Leigh Sober, MD [facility] None  01/21/2025  2:00 PM [facility] [place]  01/28/2025  2:00 PM [facility] [facility]  1 [facility] [place]    For next visit:  continue with routine prenatal care     Robin Houston, CNM  01/23/261:07 PM  "

## 2024-12-25 NOTE — Patient Instructions (Incomplete)
 Third Trimester of Pregnancy  The third trimester of pregnancy is from week 28 through week 40. This is months 7 through 9. The third trimester is a time when your baby is growing fast. Body changes during your third trimester Your body continues to change during this time. The changes usually go away after your baby is born. Physical changes You will continue to gain weight. You may get stretch marks on your hips, belly, and breasts. Your breasts will keep growing and may hurt. A yellow fluid (colostrum) may leak from your breasts. This is the first milk you're making for your baby. Your hair may grow faster and get thicker. In some cases, you may get hair loss. Your belly button may stick out. You may have more swelling in your hands, face, or ankles. Health changes You may have heartburn. You may feel short of breath. This is caused by the uterus that is now bigger. You may have more aches in the pelvis, back, or thighs. You may have more tingling or numbness in your hands, arms, and legs. You may pee more often. You may have trouble pooping (constipation) or swollen veins in the butt that can itch or get painful (hemorrhoids). Other changes You may have more problems sleeping. You may notice the baby moving lower in your belly (dropping). You may have more fluid coming from your vagina. Your joints may feel loose, and you may have pain around your pelvic bone. Follow these instructions at home: Medicines Take medicines only as told by your health care provider. Some medicines are not safe during pregnancy. Your provider may change the medicines that you take. Do not take any medicines unless told to by your provider. Take a prenatal vitamin that has at least 600 micrograms (mcg) of folic acid. Do not use herbal medicines, illegal drugs, or medicines that are not approved by your provider. Eating and drinking While you're pregnant your body needs additional nutrition to help  support your growing baby. Talk with your provider about your nutritional needs. Activity Most women are able to exercise regularly during pregnancy. Exercise routines may need to change at the end of your pregnancy. Talk to your provider about your activities and exercise routine. Relieving pain and discomfort Rest often with your legs raised if you have leg cramps or low back pain. Take warm sitz baths to soothe pain from hemorrhoids. Use hemorrhoid cream if your provider says it's okay. Wear a good, supportive bra if your breasts hurt. Do not use hot tubs, steam rooms, or saunas. Do not douche. Do not use tampons or scented pads. Safety Talk to your provider before traveling far distances. Wear your seatbelt at all times when you're in a car. Talk to your provider if someone hits you, hurts you, or yells at you. Preparing for birth To prepare for your baby: Take childbirth and breastfeeding classes. Visit the hospital and tour the maternity area. Buy a rear-facing car seat. Learn how to install it in your car. General instructions Avoid cat litter boxes and soil used by cats. These things carry germs that can cause harm to your pregnancy and your baby. Do not drink alcohol, smoke, vape, or use products with nicotine or tobacco in them. If you need help quitting, talk with your provider. Keep all follow-up visits for your third trimester. Your provider will do more exams and tests during this trimester. Write down your questions. Take them to your prenatal visits. Your provider also will: Talk with you about  your overall health. Give you advice or refer you to specialists who can help with different needs, including: Mental health and counseling. Foods and healthy eating. Ask for help if you need help with food. Where to find more information American Pregnancy Association: americanpregnancy.org [employer]: acog.org Office on [employer] Health:  [employer] Contact a health care provider if: You have a headache that does not go away when you take medicine. You have any of these problems: You can't eat or drink. You have nausea and vomiting. You have watery poop (diarrhea) for 2 days or more. You have pain when you pee, or your pee smells bad. You have been sick for 2 days or more and aren't getting better. Contact your provider right away if: You have any of these coming from your vagina: Abnormal discharge. Bad-smelling fluid. Bleeding. Your baby is moving less than usual. You have signs of labor: You have any contractions, belly cramping, or have pain in your pelvis or lower back before 37 weeks of pregnancy (preterm labor). You have regular contractions that are less than 5 minutes apart. Your water breaks. You have symptoms of high blood pressure or preeclampsia. These include: A severe, throbbing headache that does not go away. Sudden or extreme swelling of your face, hands, legs, or feet. Vision problems: You see spots. You have blurry vision. Your eyes are sensitive to light. If you can't reach your provider, go to an urgent care or emergency room. Get help right away if: You faint, become confused, or can't think clearly. You have chest pain or trouble breathing. You have any kind of injury, such as from a fall or a car crash. These symptoms may be an emergency. Call 911 right away. Do not wait to see if the symptoms will go away. Do not drive yourself to the hospital. This information is not intended to replace advice given to you by your health care provider. Make sure you discuss any questions you have with your health care provider. Document Revised: 08/25/2023 Document Reviewed: 03/25/2023 Elsevier Patient Education  2024 [employer].

## 2024-12-28 ENCOUNTER — Ambulatory Visit (INDEPENDENT_AMBULATORY_CARE_PROVIDER_SITE_OTHER): Admitting: Certified Nurse Midwife

## 2024-12-28 ENCOUNTER — Encounter: Payer: Self-pay | Admitting: Registered Nurse

## 2024-12-28 VITALS — BP 108/71 | HR 107 | Wt 313.3 lb

## 2024-12-28 DIAGNOSIS — O0991 Supervision of high risk pregnancy, unspecified, first trimester: Secondary | ICD-10-CM | POA: Diagnosis not present

## 2024-12-28 DIAGNOSIS — O24313 Unspecified pre-existing diabetes mellitus in pregnancy, third trimester: Secondary | ICD-10-CM

## 2024-12-28 DIAGNOSIS — Z3A30 30 weeks gestation of pregnancy: Secondary | ICD-10-CM | POA: Diagnosis not present

## 2024-12-28 DIAGNOSIS — O0990 Supervision of high risk pregnancy, unspecified, unspecified trimester: Secondary | ICD-10-CM

## 2024-12-28 MED ORDER — INSULIN GLARGINE 100 UNIT/ML SOLOSTAR PEN: 18.0000 [IU] | PEN_INJECTOR | Freq: Every day | SUBCUTANEOUS | 1 refills | Status: AC

## 2024-12-28 NOTE — Assessment & Plan Note (Signed)
 Insulin  again increased due to >50% fastings elevated. Now at 18u Lantus  at bedtime. MFM appointment for growth 1/26. Timing of delivery dependent on glucose control.

## 2024-12-28 NOTE — Assessment & Plan Note (Signed)
 Red flag warning signs, how to contact provider reviewed. Start antenatal testing at 32w. Needs visit with physician for CD planning.

## 2024-12-31 ENCOUNTER — Ambulatory Visit

## 2024-12-31 DIAGNOSIS — O24313 Unspecified pre-existing diabetes mellitus in pregnancy, third trimester: Secondary | ICD-10-CM

## 2024-12-31 DIAGNOSIS — Z2839 Other underimmunization status: Secondary | ICD-10-CM

## 2025-01-07 ENCOUNTER — Other Ambulatory Visit

## 2025-01-07 ENCOUNTER — Ambulatory Visit

## 2025-01-14 ENCOUNTER — Ambulatory Visit

## 2025-01-14 ENCOUNTER — Other Ambulatory Visit

## 2025-01-15 ENCOUNTER — Encounter: Admitting: Obstetrics

## 2025-01-15 ENCOUNTER — Other Ambulatory Visit

## 2025-01-21 ENCOUNTER — Other Ambulatory Visit

## 2025-01-28 ENCOUNTER — Ambulatory Visit

## 2025-02-04 ENCOUNTER — Ambulatory Visit
